# Patient Record
Sex: Female | Born: 1955
Health system: Southern US, Community
[De-identification: ages and names within clinical notes are randomized; demographics above are authoritative.]

## PROBLEM LIST (undated history)

## (undated) DIAGNOSIS — M545 Low back pain: Secondary | ICD-10-CM

## (undated) DIAGNOSIS — J0510 Acute epiglottitis without obstruction: Secondary | ICD-10-CM

## (undated) DIAGNOSIS — M722 Plantar fascial fibromatosis: Secondary | ICD-10-CM

## (undated) DIAGNOSIS — N951 Menopausal and female climacteric states: Secondary | ICD-10-CM

## (undated) DIAGNOSIS — J189 Pneumonia, unspecified organism: Secondary | ICD-10-CM

## (undated) DIAGNOSIS — T4145XA Adverse effect of unspecified anesthetic, initial encounter: Secondary | ICD-10-CM

## (undated) DIAGNOSIS — M79646 Pain in unspecified finger(s): Secondary | ICD-10-CM

## (undated) DIAGNOSIS — Z8619 Personal history of other infectious and parasitic diseases: Secondary | ICD-10-CM

## (undated) DIAGNOSIS — Z9889 Other specified postprocedural states: Secondary | ICD-10-CM

## (undated) DIAGNOSIS — M1990 Unspecified osteoarthritis, unspecified site: Secondary | ICD-10-CM

## (undated) DIAGNOSIS — T8859XA Other complications of anesthesia, initial encounter: Secondary | ICD-10-CM

## (undated) DIAGNOSIS — R112 Nausea with vomiting, unspecified: Secondary | ICD-10-CM

## (undated) HISTORY — DX: Unspecified osteoarthritis, unspecified site: M19.90

## (undated) HISTORY — DX: Low back pain: M54.5

## (undated) HISTORY — DX: Personal history of other infectious and parasitic diseases: Z86.19

## (undated) HISTORY — DX: Pain in unspecified finger(s): M79.646

## (undated) HISTORY — DX: Menopausal and female climacteric states: N95.1

## (undated) HISTORY — DX: Acute epiglottitis without obstruction: J05.10

## (undated) HISTORY — PX: TOTAL ABDOMINAL HYSTERECTOMY W/ BILATERAL SALPINGOOPHORECTOMY: SHX83

## (undated) HISTORY — DX: Plantar fascial fibromatosis: M72.2

## (undated) HISTORY — PX: ABDOMINAL HYSTERECTOMY: SHX81

## (undated) HISTORY — DX: Pneumonia, unspecified organism: J18.9

---
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
## 1974-04-20 HISTORY — PX: REDUCTION MAMMAPLASTY: SUR839

## 1991-04-21 DIAGNOSIS — J189 Pneumonia, unspecified organism: Secondary | ICD-10-CM

## 1991-04-21 HISTORY — DX: Pneumonia, unspecified organism: J18.9

## 1993-04-20 HISTORY — PX: ARTHROSCOPIC REPAIR ACL: SUR80

## 1997-04-20 DIAGNOSIS — M545 Low back pain, unspecified: Secondary | ICD-10-CM

## 1997-04-20 HISTORY — DX: Low back pain, unspecified: M54.50

## 1997-04-20 HISTORY — PX: FOOT SURGERY: SHX648

## 2010-11-21 ENCOUNTER — Encounter: Payer: Self-pay | Admitting: Internal Medicine

## 2010-11-21 ENCOUNTER — Ambulatory Visit (INDEPENDENT_AMBULATORY_CARE_PROVIDER_SITE_OTHER): Payer: PRIVATE HEALTH INSURANCE | Admitting: Internal Medicine

## 2010-11-21 DIAGNOSIS — N951 Menopausal and female climacteric states: Secondary | ICD-10-CM

## 2010-11-21 DIAGNOSIS — M545 Low back pain: Secondary | ICD-10-CM

## 2010-11-21 DIAGNOSIS — Z0000 Encounter for general adult medical examination without abnormal findings: Secondary | ICD-10-CM

## 2010-11-21 DIAGNOSIS — M79609 Pain in unspecified limb: Secondary | ICD-10-CM

## 2010-11-21 DIAGNOSIS — M79646 Pain in unspecified finger(s): Secondary | ICD-10-CM

## 2010-11-21 DIAGNOSIS — M129 Arthropathy, unspecified: Secondary | ICD-10-CM

## 2010-11-21 DIAGNOSIS — M1990 Unspecified osteoarthritis, unspecified site: Secondary | ICD-10-CM
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
## 2010-11-23 ENCOUNTER — Encounter: Payer: Self-pay | Admitting: Internal Medicine

## 2010-11-23 DIAGNOSIS — M79646 Pain in unspecified finger(s): Secondary | ICD-10-CM | POA: Insufficient documentation

## 2010-11-23 DIAGNOSIS — M1990 Unspecified osteoarthritis, unspecified site: Secondary | ICD-10-CM | POA: Insufficient documentation

## 2010-11-23 DIAGNOSIS — E8940 Asymptomatic postprocedural ovarian failure: Secondary | ICD-10-CM | POA: Insufficient documentation

## 2010-11-23 DIAGNOSIS — Z0000 Encounter for general adult medical examination without abnormal findings: Secondary | ICD-10-CM | POA: Insufficient documentation

## 2010-11-23 DIAGNOSIS — M545 Low back pain: Secondary | ICD-10-CM | POA: Insufficient documentation

## 2010-11-23 NOTE — Assessment & Plan Note (Signed)
Moderate discomfort with no limitation in activity.  Plan - referral to [facility] for evaluation, treatment and teaching for a healthy back.

## 2010-11-23 NOTE — Assessment & Plan Note (Signed)
Pain at the PIP joint index finger- worse with pressure. She has seen a hand specialist - no orthopedic problem identified.  Plan - recommended [facility] for possible alternative treatment, i.e. acupuncture

## 2010-11-23 NOTE — Assessment & Plan Note (Signed)
Records have been requested: last 2 years of lab, last 1-2 years of office notes, all imaging studies, endoscopy reports. She did see her gynecologist last year for normal exam. She is informed of ACOG/USPHS recommendation for vulvo-vaginal exam every 3-5 years with no indication for continued PAP smears and that services are available here. She is current with breast health/mammography (records requested) and colorectal cancer screening. Will review records for immunization data.  In summary- a very nice woman who has presented to establish for care. She will obtain records. She is oriented to the practice services and logistics. I have asked that she return in several weeks for a consolidation visit and more thorough physical exam.

## 2010-11-23 NOTE — Assessment & Plan Note (Signed)
Discussed risk and benefit of hormone replacement therapy. She was aware of the risks and feels the benefit is substantial.  Plan - continue present medical regimen

## 2010-12-11 ENCOUNTER — Encounter: Payer: Self-pay | Admitting: Internal Medicine

## 2010-12-12 ENCOUNTER — Encounter: Payer: Self-pay | Admitting: Internal Medicine

## 2010-12-12 ENCOUNTER — Ambulatory Visit (INDEPENDENT_AMBULATORY_CARE_PROVIDER_SITE_OTHER): Payer: PRIVATE HEALTH INSURANCE | Admitting: Internal Medicine

## 2010-12-12 DIAGNOSIS — M545 Low back pain: Secondary | ICD-10-CM

## 2010-12-12 NOTE — Progress Notes (Signed)
  Subjective:    Patient ID: [name], female    DOB: [birth year], 55 y.o.   MRN: [id number]  HPI Mrs. Nagorski presents for follow-up after new patient evaluation. She had a few additions and corrections to history. She does report that she had a flex sig. Dec 11 - normal study.   I have reviewed the patient's medical history in detail and updated the computerized patient record.    Review of Systems System review is negative for any constitutional, cardiac, pulmonary, GI or neuro symptoms or complaints     Objective:   Physical Exam vitsls - stable Gen'l - WNWD white woman in no distress  Cor - RRR Pul - normal respiration.       Assessment & Plan:

## 2010-12-14 NOTE — Assessment & Plan Note (Signed)
Still with discomfort and difficulty playing golf.  Plan - after the dust settles she will be pursuing referral to [facility].

## 2011-01-20 ENCOUNTER — Telehealth: Payer: Self-pay | Admitting: Internal Medicine

## 2011-01-20 NOTE — Telephone Encounter (Signed)
Forwarded to Dr. Norins for review. °

## 2011-01-22 ENCOUNTER — Ambulatory Visit: Payer: PRIVATE HEALTH INSURANCE

## 2011-01-27 ENCOUNTER — Ambulatory Visit (INDEPENDENT_AMBULATORY_CARE_PROVIDER_SITE_OTHER): Payer: PRIVATE HEALTH INSURANCE

## 2011-01-27 DIAGNOSIS — Z23 Encounter for immunization: Secondary | ICD-10-CM

## 2011-04-28 ENCOUNTER — Ambulatory Visit: Payer: Self-pay | Admitting: Internal Medicine

## 2011-08-01 ENCOUNTER — Encounter (HOSPITAL_COMMUNITY): Payer: Self-pay | Admitting: Family Medicine

## 2011-08-01 ENCOUNTER — Emergency Department (HOSPITAL_COMMUNITY)
Admission: EM | Admit: 2011-08-01 | Discharge: 2011-08-01 | Disposition: A | Discharge status: Home / Self Care | Payer: PRIVATE HEALTH INSURANCE | Attending: Emergency Medicine | Admitting: Emergency Medicine

## 2011-08-01 ENCOUNTER — Emergency Department (HOSPITAL_COMMUNITY): Payer: PRIVATE HEALTH INSURANCE

## 2011-08-01 DIAGNOSIS — Z79899 Other long term (current) drug therapy: Secondary | ICD-10-CM | POA: Insufficient documentation

## 2011-08-01 DIAGNOSIS — R609 Edema, unspecified: Secondary | ICD-10-CM | POA: Insufficient documentation

## 2011-08-01 DIAGNOSIS — M129 Arthropathy, unspecified: Secondary | ICD-10-CM | POA: Insufficient documentation

## 2011-08-01 DIAGNOSIS — J189 Pneumonia, unspecified organism: Secondary | ICD-10-CM | POA: Insufficient documentation

## 2011-08-01 DIAGNOSIS — R079 Chest pain, unspecified: Secondary | ICD-10-CM | POA: Insufficient documentation

## 2011-08-01 LAB — URINALYSIS, ROUTINE W REFLEX MICROSCOPIC
Glucose, UA: NEGATIVE mg/dL
Leukocytes, UA: NEGATIVE
Protein, ur: NEGATIVE mg/dL
Specific Gravity, Urine: 1.011 (ref 1.005–1.030)
pH: 6 (ref 5.0–8.0)

## 2011-08-01 LAB — COMPREHENSIVE METABOLIC PANEL
AST: 21 U/L (ref 0–37)
Albumin: 4 g/dL (ref 3.5–5.2)
BUN: 14 mg/dL (ref 6–23)
Chloride: 102 mEq/L (ref 96–112)
Creatinine, Ser: 0.87 mg/dL (ref 0.50–1.10)
Total Bilirubin: 0.5 mg/dL (ref 0.3–1.2)
Total Protein: 7.7 g/dL (ref 6.0–8.3)

## 2011-08-01 LAB — D-DIMER, QUANTITATIVE: D-Dimer, Quant: 0.81 ug/mL-FEU — ABNORMAL HIGH (ref 0.00–0.48)

## 2011-08-01 LAB — APTT: aPTT: 31 seconds (ref 24–37)

## 2011-08-01 LAB — CBC
MCHC: 33.5 g/dL (ref 30.0–36.0)
MCV: 92 fL (ref 78.0–100.0)
Platelets: 253 10*3/uL (ref 150–400)
RDW: 12.7 % (ref 11.5–15.5)
WBC: 5.1 10*3/uL (ref 4.0–10.5)

## 2011-08-01 LAB — POCT I-STAT TROPONIN I: Troponin i, poc: 0 ng/mL (ref 0.00–0.08)

## 2011-08-01 LAB — PROTIME-INR
INR: 0.97 (ref 0.00–1.49)
Prothrombin Time: 13.1 seconds (ref 11.6–15.2)

## 2011-08-01 LAB — URINE MICROSCOPIC-ADD ON

## 2011-08-01 MED ORDER — IOHEXOL 350 MG/ML SOLN
65.0000 mL | Freq: Once | INTRAVENOUS | Status: AC | PRN
  Administered 2011-08-01: 65 mL via INTRAVENOUS

## 2011-08-01 MED ORDER — NITROGLYCERIN 0.4 MG SL SUBL: 0.4000 mg | SUBLINGUAL_TABLET | SUBLINGUAL | Status: DC | PRN

## 2011-08-01 MED ORDER — SODIUM CHLORIDE 0.9 % IV SOLN
1000.0000 mL | INTRAVENOUS | Status: DC
  Administered 2011-08-01: 1000 mL via INTRAVENOUS

## 2011-08-01 MED ORDER — SODIUM CHLORIDE 0.9 % IV SOLN
1000.0000 mL | Freq: Once | INTRAVENOUS | Status: AC
  Administered 2011-08-01: 1000 mL via INTRAVENOUS

## 2011-08-01 MED ORDER — AZITHROMYCIN 250 MG PO TABS
500.0000 mg | ORAL_TABLET | Freq: Once | ORAL | Status: AC
  Administered 2011-08-01: 500 mg via ORAL
  Filled 2011-08-01: qty 2

## 2011-08-01 MED ORDER — ASPIRIN 81 MG PO CHEW
324.0000 mg | CHEWABLE_TABLET | Freq: Once | ORAL | Status: AC
  Administered 2011-08-01: 324 mg via ORAL
  Filled 2011-08-01: qty 4

## 2011-08-01 MED ORDER — AZITHROMYCIN 250 MG PO TABS: 250.0000 mg | ORAL_TABLET | Freq: Every day | ORAL | Status: AC

## 2011-08-01 NOTE — Discharge Instructions (Signed)
You have been diagnosed with Pneumonia. Complete your full coarse of antibiotics as prescribed. Followup with your doctor for a repeat xray to assure your pneumonia has cleared. You may return to the emergency department if symptoms worsen, become progressive, or become more concerning.  Pneumonia, Adult Pneumonia is an infection of the lungs.  CAUSES Pneumonia may be caused by bacteria or a virus. Usually, these infections are caused by breathing infectious particles into the lungs (respiratory tract). SYMPTOMS   Cough.   Fever.   Chest pain.   Increased rate of breathing.   Wheezing.   Mucus production.  DIAGNOSIS  If you have the common symptoms of pneumonia, your caregiver will typically confirm the diagnosis with a chest X-ray. The X-ray will show an abnormality in the lung (pulmonary infiltrate) if you have pneumonia. Other tests of your blood, urine, or sputum may be done to find the specific cause of your pneumonia. Your caregiver may also do tests (blood gases or pulse oximetry) to see how well your lungs are working. TREATMENT  Some forms of pneumonia may be spread to other people when you cough or sneeze. You may be asked to wear a mask before and during your exam. Pneumonia that is caused by bacteria is treated with antibiotic medicine. Pneumonia that is caused by the influenza virus may be treated with an antiviral medicine. Most other viral infections must run their course. These infections will not respond to antibiotics.  PREVENTION A pneumococcal shot (vaccine) is available to prevent a common bacterial cause of pneumonia. This is usually suggested for:  People over 25 years old.   Patients on chemotherapy.   People with chronic lung problems, such as bronchitis or emphysema.   People with immune system problems.  If you are over 65 or have a high risk condition, you may receive the pneumococcal vaccine if you have not received it before. In some countries, a routine  influenza vaccine is also recommended. This vaccine can help prevent some cases of pneumonia.You may be offered the influenza vaccine as part of your care. If you smoke, it is time to quit. You may receive instructions on how to stop smoking. Your caregiver can provide medicines and counseling to help you quit. HOME CARE INSTRUCTIONS   Cough suppressants may be used if you are losing too much rest. However, coughing protects you by clearing your lungs. You should avoid using cough suppressants if you can.   Your caregiver may have prescribed medicine if he or she thinks your pneumonia is caused by a bacteria or influenza. Finish your medicine even if you start to feel better.   Your caregiver may also prescribe an expectorant. This loosens the mucus to be coughed up.   Only take over-the-counter or prescription medicines for pain, discomfort, or fever as directed by your caregiver.   Do not smoke. Smoking is a common cause of bronchitis and can contribute to pneumonia. If you are a smoker and continue to smoke, your cough may last several weeks after your pneumonia has cleared.   A cold steam vaporizer or humidifier in your room or home may help loosen mucus.   Coughing is often worse at night. Sleeping in a semi-upright position in a recliner or using a couple pillows under your head will help with this.   Get rest as you feel it is needed. Your body will usually let you know when you need to rest.  SEEK IMMEDIATE MEDICAL CARE IF:   Your illness becomes  worse. This is especially true if you are elderly or weakened from any other disease.   You cannot control your cough with suppressants and are losing sleep.   You begin coughing up blood.   You develop pain which is getting worse or is uncontrolled with medicines.   You have a fever.   Any of the symptoms which initially brought you in for treatment are getting worse rather than better.   You develop shortness of breath or chest  pain.  MAKE SURE YOU:   Understand these instructions.   Will watch your condition.   Will get help right away if you are not doing well or get worse.

## 2011-08-01 NOTE — ED Notes (Signed)
CT reported pt was tearful and c/o pain with IV site. Joanie Coddington, RN went to CT to assess site, no redness or swelling at site, good blood return then  flushed IV with 10ml NS. No c/o pain from pt.

## 2011-08-01 NOTE — ED Notes (Signed)
Per pt she has been having constant chest pain since last night. sts under left breast. sts hurts worse with deep breathing. Pt slightly anxious. Denies cough, fever, SOB, N,V,D.

## 2011-08-01 NOTE — ED Provider Notes (Signed)
History     CSN: [id number]  Arrival date & time 08/01/11  4098   First MD Initiated Contact with Patient 08/01/11 1004      Chief Complaint  Patient presents with  . Chest Pain    (Consider location/radiation/quality/duration/timing/severity/associated sxs/prior treatment) HPI Comments: Patient currently on replacement therapy presents emergency department with chief complaint of chest pain.  Onset of chest pain began last evening around 8 p.m. is located substernally and in the left breast, described as a constant sharp pain that is pleuritic in nature and rated at a 8/10 in severity, but increases to a 10 out of 10 with deep inhalation.  The patient denies any shortness of breath, diaphoresis, fever, night sweats, chills, syncope, edema, claudication.  Patient is not a smoker and has not recently traveled, had surgery, and does not have a history of cancer.  Patient has not been evaluated by a cardiologist before in the past and is new to the [place] area.  Patient is seen by Dr. Debby Bud, PCP  Patient is a 56 y.o. female presenting with chest pain. The history is provided by the patient.  Chest Pain Pertinent negatives for primary symptoms include no fever, no shortness of breath, no cough, no wheezing, no palpitations, no abdominal pain, no nausea and no dizziness.  Pertinent negatives for associated symptoms include no numbness and no weakness.     Past Medical History  Diagnosis Date  . Arthritis     right  . History of varicella   . Epiglottitis 1959    tracheotomy  . Pneumonia 1993    hospitalized  . Lumbar back pain 1999     initial strain. Intermittent pain since. Re-injured Dec '11. Had PT IN THE PAST.  . Finger pain     pain with pressure on the PIP joint with hemorrhage and swelling. MRI hand inconclusive   . Climacteric     on HRT  . Plantar fasciitis     Past Surgical History  Procedure Date  . Meniscus repair 1975    right medial/ open  . Arthroscopic  repair acl 1995    right-reconstructed with patellar tendon  . Abdominal hysterectomy     endometriosis  . Total abdominal hysterectomy w/ bilateral salpingoophorectomy '02  . Foot surgery 1999    left foot-arch ligament release (metatarsal release ?)  . Reduction mammoplasty 1975    Family History  Problem Relation Age of Onset  . Hypertension Mother   . Heart disease Father     CABG  . Hyperlipidemia Father   . Hypertension Father   . Obesity Father   . Gout Father   . Alcohol abuse Father   . Cancer Neg Hx   . Diabetes Neg Hx   . HIV Brother     History  Substance Use Topics  . Smoking status: Never Smoker   . Smokeless tobacco: Never Used  . Alcohol Use: No    OB History    Grav Para Term Preterm Abortions TAB SAB Ect Mult Living                  Review of Systems  Constitutional: Negative for fever, chills and appetite change.  HENT: Negative for congestion.   Eyes: Negative for visual disturbance.  Respiratory: Negative for cough, chest tightness, shortness of breath and wheezing.   Cardiovascular: Positive for chest pain. Negative for palpitations and leg swelling.  Gastrointestinal: Negative for nausea, abdominal pain and constipation.  Genitourinary: Negative for dysuria,  urgency and frequency.  Neurological: Negative for dizziness, syncope, weakness, light-headedness, numbness and headaches.  Psychiatric/Behavioral: Negative for confusion.  All other systems reviewed and are negative.    Allergies  Penicillins and Tape  Home Medications   Current Outpatient Rx  Name Route Sig Dispense Refill  . VIACTIV PO Oral Take 1 tablet by mouth 2 (two) times daily.    Marland Kitchen EST ESTROGENS-METHYLTEST 0.625-1.25 MG PO TABS Oral Take 1 tablet by mouth daily.      . MULTIPLE VITAMINS PO Oral Take by mouth.      Marland Kitchen VITAMIN C 500 MG PO TABS Oral Take 500 mg by mouth daily.      Marland Kitchen EPINEPHRINE 0.3 MG/0.3ML IJ DEVI Intramuscular Inject 0.3 mg into the muscle as needed.  Allergic reactions...anaphylaxis    . LORAZEPAM 0.5 MG PO TABS Oral Take 0.5 mg by mouth every 8 (eight) hours as needed. anxiety      BP 116/71  Pulse 57  Temp(Src) 97.6 F (36.4 C) (Oral)  Resp 18  SpO2 100%  Physical Exam  Nursing note and vitals reviewed. Constitutional: She appears well-developed and well-nourished. No distress.  HENT:  Head: Normocephalic and atraumatic.  Eyes: Conjunctivae and EOM are normal. Pupils are equal, round, and reactive to light.  Neck: Normal range of motion. Neck supple. Normal carotid pulses and no JVD present. Carotid bruit is not present. No rigidity. Normal range of motion present.  Cardiovascular: Normal rate, regular rhythm, S1 normal, S2 normal, normal heart sounds, intact distal pulses and normal pulses.  Exam reveals no gallop and no friction rub.   No murmur heard.      No pitting edema bilaterally, RRR, no aberrant sounds on auscultations, distal pulses intact, no carotid bruit or JVD.   Pulmonary/Chest: Effort normal and breath sounds normal. No accessory muscle usage or stridor. No respiratory distress. She exhibits no tenderness and no bony tenderness.  Abdominal: Bowel sounds are normal.       Soft non tender. Non pulsatile aorta.   Skin: Skin is warm, dry and intact. No rash noted. She is not diaphoretic. No cyanosis. Nails show no clubbing.    ED Course  Procedures (including critical care time)  Labs Reviewed  COMPREHENSIVE METABOLIC PANEL - Abnormal; Notable for the following:    GFR calc non Af Amer 73 (*)    GFR calc Af Amer 85 (*)    All other components within normal limits  URINALYSIS, ROUTINE W REFLEX MICROSCOPIC - Abnormal; Notable for the following:    Hgb urine dipstick TRACE (*)    All other components within normal limits  D-DIMER, QUANTITATIVE - Abnormal; Notable for the following:    D-Dimer, Quant 0.81 (*)    All other components within normal limits  CBC  PROTIME-INR  APTT  POCT I-STAT TROPONIN I  URINE  MICROSCOPIC-ADD ON   Dg Chest 2 View  08/01/2011  *RADIOLOGY REPORT*  Clinical Data: Left-sided chest pain  CHEST - 2 VIEW  Comparison: Chest CT 08/01/2011  Findings: Normal cardiac silhouette.  Subtle obscuration of the left heart border consistent with pneumonia.  The no pleural fluid. No pneumothorax.  IMPRESSION: Lingular pneumonia.  Original Report Authenticated By: Genevive Bi, M.D.   Ct Angio Chest W/cm &/or Wo Cm  08/01/2011  *RADIOLOGY REPORT*  Clinical Data: Chest pain with pleurisy.  Pain under left breast. Question pulmonary embolism.  CT ANGIOGRAPHY CHEST  Technique:  Multidetector CT imaging of the chest using the standard protocol during bolus  administration of intravenous contrast. Multiplanar reconstructed images including MIPs were obtained and reviewed to evaluate the vascular anatomy.  Contrast: 65mL OMNIPAQUE IOHEXOL 350 MG/ML SOLN  Comparison: None.  Findings: The pulmonary arteries are well opacified with contrast. There is no evidence of acute pulmonary embolism.  The thoracic aorta appears normal for age.  There are no enlarged mediastinal or hilar lymph nodes.  There is air space consolidation with air bronchograms in the lingula.  The pulmonary arteries in this area appear patent.  There is also patchy airspace disease in the right middle lobe.  No mass or endobronchial lesion is demonstrated.  There is no pleural or pericardial effusion.  The visualized upper abdomen appears unremarkable.  IMPRESSION:  1.  No evidence of acute pulmonary embolism. 2.  Lingular and right middle lobe pneumonia.  Radiographic followup is recommended to document clearing.  Original Report Authenticated By: Gerrianne Scale, M.D.     No diagnosis found.   Date: 08/01/2011  Rate: 63   Rhythm: normal sinus rhythm  QRS Axis: normal  Intervals: normal  ST/T Wave abnormalities: normal  Conduction Disutrbances: none  Narrative Interpretation:   Old EKG Reviewed: No old ECG    MDM  Chest  Pain, CAP  Patient is to be discharged with recommendation to follow up with PCP in regards to today's hospital visit. Pt with CAP treated w azithromycin & advised to f-u w pcp to assure PNA has cleared with tx.Chest pain is not likely of cardiac etiology d/t presentation, VSS, no tracheal deviation, no JVD or new murmur, RRR, breath sounds equal bilaterally, EKG without acute abnormalities, negative troponin.  Pt has been advised to return to the ED is CP becomes exertional, associated with diaphoresis or nausea, radiates to left jaw/arm, worsens or becomes concerning in any way. Pt appears reliable for follow up and is agreeable to discharge.   Case has been discussed with and seen by Dr. Alto Denver who agrees with the above plan to discharge.          Jaci Carrel, [place] 08/01/11 1548

## 2011-08-01 NOTE — ED Notes (Signed)
Pt states pain started last night after dinner, states she did try a new food and pain started shortly after eating.

## 2011-08-03 ENCOUNTER — Telehealth: Payer: Self-pay

## 2011-08-03 NOTE — Telephone Encounter (Signed)
[facility] Triage Call Report Triage Record Num: [id number] Operator: Baldomero Lamy Patient Name: Karen [name] Call Date & Time: 08/01/2011 8:41:06AM Patient Phone: [phone] PCP: Illene Regulus Patient Gender: Female PCP Fax : [phone] Patient DOB: 02-Dec-1955 [facility] Name: [facility] Reason for Call: Caller: Daphine/Patient; PCP: Illene Regulus; CB#: [phone]; Call regarding Feels Like She Is Having Gas Or Anxiety in Her Chest. Pt calling regarding extreme anxiety and chest pain, localized under left breast-"feels like when I had a broken rib"-since 4/12 pm. Is having extreme anxiety regarding a situation she did not want to elaborate on. Pt verbalized apprehension repeatedly about speaking with me (triager) because I was a "complete stranger". Triaged per Anxiety: Panic. Disp: See ER Immed. Pt states she does NOT want to go to the ER because she was new to the area and "very scared". Very insistant that Dr. Debby Bud asked pt to call him and not go to ER if pt "needed him". Explained that another MD was on call and she was still insistant the Dr. Debby Bud told her he wanted to be called. Called Md at pt insistance: MD in [employer]: advised that if pt refused ER than to encourage her to be seen at [facility] office for evaluation. Relayed to pt who eventually stated she would go to ER if Dr. Debby Bud thought that best. Care advice given and pt to have husband take her within the next 15 minutes. Pt wished I call ER and let them know she was coming. Spoke with Melody, triage RN and secretary to give them a heads up. Protocol(s) Used: Anxiety: Panic Recommended Outcome per Protocol: See ED Immediately Reason for Outcome: Dizziness, faintness, or lightheadedness lasting more than 15 minutes Care Advice: ~ Another adult should drive. ~ Should not be alone, arrange for support (family member, friend, etc.). Avoid the use of stimulants including caffeine (coffee, some soft drinks,  some energy drinks, tea and chocolate), cocaine, and amphetamines. Also avoid drinking alcohol. ~ ~ IMMEDIATE ACTION Write down provider's name. List or place the following in a bag for transport with the patient: current prescription and/or nonprescription medications; alternative treatments, therapies and medications; and street drugs. ~ 08/01/2011 9:38:20AM Page 1 of 1 [id number]

## 2011-08-04 ENCOUNTER — Encounter: Payer: Self-pay | Admitting: Endocrinology

## 2011-08-04 ENCOUNTER — Ambulatory Visit (INDEPENDENT_AMBULATORY_CARE_PROVIDER_SITE_OTHER): Payer: PRIVATE HEALTH INSURANCE | Admitting: Endocrinology

## 2011-08-04 VITALS — BP 104/62 | HR 62 | Temp 98.0°F | Ht 64.5 in | Wt 119.1 lb

## 2011-08-04 DIAGNOSIS — J189 Pneumonia, unspecified organism: Secondary | ICD-10-CM
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
## 2011-08-04 NOTE — ED Provider Notes (Signed)
Medical screening examination/treatment/procedure(s) were conducted as a shared visit with non-physician practitioner(s) and myself.  I personally evaluated the patient during the encounter  Cyndra Numbers, MD 08/04/11 [phone]

## 2011-08-04 NOTE — Patient Instructions (Addendum)
Please finish antibiotics I hope you feel better soon.  If you don't feel better by next week, please call back. Go to x-ray downstairs in approx 2 weeks to repeat x-ray.  You will receive a letter with results. increase your activity gradually, as you feel like you can do.

## 2011-08-07 ENCOUNTER — Telehealth: Payer: Self-pay

## 2011-08-07 MED ORDER — LEVOFLOXACIN 500 MG PO TABS: 500.0000 mg | ORAL_TABLET | Freq: Every day | ORAL | Status: AC

## 2011-08-07 NOTE — Telephone Encounter (Signed)
Pt called stating that CP associated with Pneumonia has returned but to a lesser degree. Pt completed Azithromycin course on Wednesday and is concerned that she may need another course. Pt denies other sxs like SOB, fever chills or cough.

## 2011-08-07 NOTE — Telephone Encounter (Signed)
Pt advised of Rx/pharmacy 

## 2011-08-07 NOTE — Telephone Encounter (Signed)
i have sent a prescription to your pharmacy 

## 2011-08-17 ENCOUNTER — Ambulatory Visit (INDEPENDENT_AMBULATORY_CARE_PROVIDER_SITE_OTHER): Payer: PRIVATE HEALTH INSURANCE | Admitting: Internal Medicine

## 2011-08-17 ENCOUNTER — Encounter: Payer: Self-pay | Admitting: Internal Medicine

## 2011-08-17 ENCOUNTER — Other Ambulatory Visit (INDEPENDENT_AMBULATORY_CARE_PROVIDER_SITE_OTHER): Payer: PRIVATE HEALTH INSURANCE

## 2011-08-17 ENCOUNTER — Ambulatory Visit (INDEPENDENT_AMBULATORY_CARE_PROVIDER_SITE_OTHER)
Admission: RE | Admit: 2011-08-17 | Discharge: 2011-08-17 | Disposition: A | Discharge status: Home / Self Care | Payer: PRIVATE HEALTH INSURANCE | Source: Ambulatory Visit | Attending: Internal Medicine | Admitting: Internal Medicine

## 2011-08-17 VITALS — BP 98/60 | HR 62 | Temp 97.8°F | Resp 16 | Wt 119.0 lb

## 2011-08-17 DIAGNOSIS — J157 Pneumonia due to Mycoplasma pneumoniae: Secondary | ICD-10-CM

## 2011-08-17 LAB — CBC WITH DIFFERENTIAL/PLATELET
Basophils Absolute: 0 10*3/uL (ref 0.0–0.1)
HCT: 39 % (ref 36.0–46.0)
Hemoglobin: 13.1 g/dL (ref 12.0–15.0)
Lymphs Abs: 1.6 10*3/uL (ref 0.7–4.0)
MCHC: 33.5 g/dL (ref 30.0–36.0)
MCV: 92.5 fl (ref 78.0–100.0)
Monocytes Absolute: 0.5 10*3/uL (ref 0.1–1.0)
Monocytes Relative: 9.4 % (ref 3.0–12.0)
Neutro Abs: 2.8 10*3/uL (ref 1.4–7.7)
Platelets: 181 10*3/uL (ref 150.0–400.0)
RDW: 13.5 % (ref 11.5–14.6)

## 2011-08-17 LAB — COMPREHENSIVE METABOLIC PANEL
ALT: 19 U/L (ref 0–35)
Alkaline Phosphatase: 50 U/L (ref 39–117)
CO2: 31 mEq/L (ref 19–32)
Creatinine, Ser: 0.8 mg/dL (ref 0.4–1.2)
GFR: 75.35 mL/min (ref >60.00)
Sodium: 139 mEq/L (ref 135–145)
Total Bilirubin: 0.5 mg/dL (ref 0.3–1.2)

## 2011-08-17 MED ORDER — AZITHROMYCIN 500 MG PO TABS: 500.0000 mg | ORAL_TABLET | Freq: Every day | ORAL | Status: AC

## 2011-08-17 NOTE — Patient Instructions (Signed)
all the evidence points to mycoplasma pneumoniae - with a respiratory involvement. This is not bacterial pneumonia.  Plan - Chest x-ray today           CBC and metabolic panel todays.           If all is normal - most likely persistent mycoplasma and will retreat with azithromycin.            Hydrate: juice, gator aide, etc. - you are feeling light-headed because of low blood pressure of 98/60 with usual being 120/80
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
## 2011-08-19 ENCOUNTER — Telehealth: Payer: Self-pay | Admitting: *Deleted

## 2011-08-19 NOTE — Telephone Encounter (Signed)
Message copied by Elnora Morrison on Wed Aug 19, 2011  4:54 PM ------      Message from: Illene Regulus E      Created: Mon Aug 17, 2011  6:31 PM       Call to follow up on patient Wedsnday 08/19/11

## 2011-08-19 NOTE — Telephone Encounter (Signed)
Message copied by Ona Roehrs S on Wed Aug 19, 2011  4:53 PM ------      Message from: NORINS, MICHAEL E      Created: Mon Aug 17, 2011  6:31 PM       Call to follow up on patient Wedsnday 08/19/11 

## 2011-08-19 NOTE — Telephone Encounter (Signed)
done

## 2011-08-19 NOTE — Telephone Encounter (Signed)
Message copied by Elnora Morrison on Wed Aug 19, 2011  4:52 PM ------      Message from: Illene Regulus E      Created: Mon Aug 17, 2011  6:31 PM       Call to follow up on patient Wedsnday 08/19/11

## 2011-08-19 NOTE — Telephone Encounter (Signed)
Message copied by Karen [name] on Wed Aug 19, 2011  4:53 PM ------      Message from: Karen [name]      Created: Mon Aug 17, 2011  6:31 PM       Call to follow up on patient Wedsnday 08/19/11

## 2011-08-19 NOTE — Telephone Encounter (Signed)
Message copied by Elnora Morrison on Wed Aug 19, 2011  4:51 PM ------      Message from: Illene Regulus E      Created: Mon Aug 17, 2011  6:31 PM       Call to follow up on patient Wedsnday 08/19/11

## 2011-08-19 NOTE — Telephone Encounter (Signed)
Attempted to call patient to check on. Voice mail box is not set up at phone number in our system  [phone].

## 2011-10-06 ENCOUNTER — Other Ambulatory Visit: Payer: Self-pay | Admitting: *Deleted

## 2011-10-06 MED ORDER — EST ESTROGENS-METHYLTEST 0.625-1.25 MG PO TABS: 1.0000 | ORAL_TABLET | Freq: Every day | ORAL | Status: DC

## 2011-10-06 NOTE — Telephone Encounter (Signed)
Refill sent to [facility]

## 2012-01-20 ENCOUNTER — Encounter: Payer: Self-pay | Admitting: Internal Medicine

## 2012-01-20 ENCOUNTER — Ambulatory Visit (INDEPENDENT_AMBULATORY_CARE_PROVIDER_SITE_OTHER): Payer: PRIVATE HEALTH INSURANCE | Admitting: Internal Medicine

## 2012-01-20 VITALS — BP 100/68 | HR 50 | Temp 97.1°F | Resp 16 | Wt 122.0 lb

## 2012-01-20 DIAGNOSIS — N898 Other specified noninflammatory disorders of vagina: Secondary | ICD-10-CM

## 2012-01-20 DIAGNOSIS — N9489 Other specified conditions associated with female genital organs and menstrual cycle: Secondary | ICD-10-CM

## 2012-01-20 DIAGNOSIS — N390 Urinary tract infection, site not specified: Secondary | ICD-10-CM

## 2012-01-20 LAB — POCT URINALYSIS DIPSTICK
Bilirubin, UA: NEGATIVE
Glucose, UA: NEGATIVE
Ketones, UA: NEGATIVE
Nitrite, UA: NEGATIVE

## 2012-01-20 MED ORDER — FLUCONAZOLE 150 MG PO TABS: 150.0000 mg | ORAL_TABLET | Freq: Once | ORAL | Status: DC

## 2012-01-21 NOTE — Progress Notes (Signed)
  Subjective:    Patient ID: Karen [name], female    DOB: 07/05/1955, 56 y.o.   MRN: [id number]  HPI [name] presents for evaluation of vaginal dryness, irritation, burning in the vulvar region, dysuria -described as external burning discomfort, dyspareunia. She reports she has been symptomatic for some time but worse over the past week or so. She denies any fever, chills, flank pain, urinary frequency or urgency or incontinence, vaginal discharge or odor. She has had mild nausea. She s/p TAH/BSO at age 2 and has been on estraTest for many years.   PMH, FamHx and SocHx reviewed for any changes and relevance.  Current Outpatient Prescriptions on File Prior to Visit  Medication Sig Dispense Refill  . Calcium-Vitamin D-Vitamin K (VIACTIV PO) Take 1 tablet by mouth 2 (two) times daily.      Marland Kitchen EPINEPHrine (EPI-PEN) 0.3 mg/0.3 mL DEVI Inject 0.3 mg into the muscle as needed. Allergic reactions...anaphylaxis      . estrogen-methylTESTOSTERone (ESTRATEST HS) 0.625-1.25 MG per tablet Take 1 tablet by mouth daily.  30 tablet  3  . LORazepam (ATIVAN) 0.5 MG tablet Take 0.5 mg by mouth every 8 (eight) hours as needed. anxiety      . MULTIPLE VITAMINS PO Take by mouth.        . vitamin C (ASCORBIC ACID) 500 MG tablet Take 500 mg by mouth daily.            Review of Systems System review is negative for any constitutional, cardiac, pulmonary, GI or neuro symptoms or complaints other than as described in the HPI.     Objective:   Physical Exam Filed Vitals:   01/20/12 1604  BP: 100/68  Pulse: 50  Temp: 97.1 F (36.2 C)  Resp: 16   WNWD white woman in no distress Cor- RRR Pulm - normal respirations  Dip U/A negative       Assessment & Plan:  Vulvo-vaginal dryness and discomfort. Despite being on estraTest atrophic vaginitis would explain many of her symptoms. Altrernatively, with recent increase in symptoms she may have an atypical vaginitis.  Plan Diflucan Stat 150 x 1  Will perform  pelvic exam if her symptoms are unchanged at her return visit Oct 7th

## 2012-01-25 ENCOUNTER — Ambulatory Visit (INDEPENDENT_AMBULATORY_CARE_PROVIDER_SITE_OTHER): Payer: PRIVATE HEALTH INSURANCE | Admitting: Internal Medicine

## 2012-01-25 ENCOUNTER — Encounter: Payer: Self-pay | Admitting: Internal Medicine

## 2012-01-25 ENCOUNTER — Other Ambulatory Visit (INDEPENDENT_AMBULATORY_CARE_PROVIDER_SITE_OTHER): Payer: PRIVATE HEALTH INSURANCE

## 2012-01-25 VITALS — BP 108/72 | HR 62 | Temp 97.1°F | Resp 16 | Wt 119.0 lb

## 2012-01-25 DIAGNOSIS — J189 Pneumonia, unspecified organism: Secondary | ICD-10-CM

## 2012-01-25 DIAGNOSIS — R1032 Left lower quadrant pain: Secondary | ICD-10-CM

## 2012-01-25 DIAGNOSIS — N951 Menopausal and female climacteric states: Secondary | ICD-10-CM

## 2012-01-25 DIAGNOSIS — M79646 Pain in unspecified finger(s): Secondary | ICD-10-CM

## 2012-01-25 DIAGNOSIS — R252 Cramp and spasm: Secondary | ICD-10-CM

## 2012-01-25 DIAGNOSIS — N898 Other specified noninflammatory disorders of vagina: Secondary | ICD-10-CM

## 2012-01-25 DIAGNOSIS — Z0000 Encounter for general adult medical examination without abnormal findings: Secondary | ICD-10-CM

## 2012-01-25 DIAGNOSIS — Z23 Encounter for immunization: Secondary | ICD-10-CM

## 2012-01-25 DIAGNOSIS — Z1239 Encounter for other screening for malignant neoplasm of breast: Secondary | ICD-10-CM

## 2012-01-25 DIAGNOSIS — Z13220 Encounter for screening for lipoid disorders: Secondary | ICD-10-CM

## 2012-01-25 DIAGNOSIS — Z1231 Encounter for screening mammogram for malignant neoplasm of breast: Secondary | ICD-10-CM

## 2012-01-25 DIAGNOSIS — M79609 Pain in unspecified limb: Secondary | ICD-10-CM

## 2012-01-25 LAB — LDL CHOLESTEROL, DIRECT: Direct LDL: 134 mg/dL

## 2012-01-25 LAB — LIPID PANEL: HDL: 81.4 mg/dL (ref >39.00)
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
## 2012-01-25 NOTE — Patient Instructions (Addendum)
Thanks for coming in. The full note to follow will include lab results and I will address the issues raised today.

## 2012-01-26 ENCOUNTER — Telehealth: Payer: Self-pay | Admitting: Internal Medicine

## 2012-01-26 DIAGNOSIS — R252 Cramp and spasm: Secondary | ICD-10-CM | POA: Insufficient documentation

## 2012-01-26 DIAGNOSIS — R1032 Left lower quadrant pain: Secondary | ICD-10-CM | POA: Insufficient documentation

## 2012-01-26 MED ORDER — METRONIDAZOLE 500 MG PO TABS: 500.0000 mg | ORAL_TABLET | Freq: Two times a day (BID) | ORAL | Status: DC

## 2012-01-26 NOTE — Assessment & Plan Note (Signed)
Long standing h/o nocturnal foot cramps.  Plan   massage using tennis ball or roller  Tonic water (quinine) 8-16 oz per day  Will check Mg level  Ginko Biloba may be helpful  Last resort - gabapentin 100 mg qHS

## 2012-01-26 NOTE — Assessment & Plan Note (Signed)
Interval history as documented in the HPI: no major illness, injury or surgery. Physical exam generally unremarkable. Lab results in normal range: LDL just above goal of 130 or less and HDL is robust giving a very protective LDL/HDL 1.6. She is current with colorectal cancer screening and is s/p hysterectomy. Due for Tdap and will return for pneumonia vaccine.  In summary - a very nice woman with issues as outlined above but generally in good health. She will contact the office should she wish to pursue consultations as described. Otherwise, she will return as needed.

## 2012-01-26 NOTE — Assessment & Plan Note (Signed)
Reports an injury approximately 1 year ago, '12,  of a left groin pull after a golf outing. This persisted for a long while. She has re injured herself with minor twisting movements twice and has continued discomfort. No h/o hip pathology.  Plan Proposed consultation with sports medicine - Dr. Farris Has  If indicated recommend PT: either with Denice Paradise, PT or [facility]

## 2012-01-26 NOTE — Assessment & Plan Note (Signed)
By report Ms. Doffing has a long h/o dyspareunia to the extend of real pain and post-coital bleeding. She is s/p TAH/BSO and has been on HRT w/ testosterone for a long time. She is aware of the risks of long term HRT and prefers to continue treatment. She was seen 1 week ago for acute dysuria-external that did clear with fluconazole but she continues to have vaginal discomfort: a dryness/ chafing discomfort that has become constant. On exam today she had a cream colored discharge suggestive of Bacterial vaginosis - specimen sent to lab.  Plan If indicated will treat for BV with flagyl  After treatment if symptoms persist, even though on oral HRT, will prescribe a trial of topical hormone therapy.

## 2012-01-26 NOTE — Telephone Encounter (Signed)
Left message: specimen could not be analyzed.  Plan - flagyl 500 mg bid x 7-Rx eScribed  Report back with results: improved symptoms or not  After treatment for presumed BV will consider topical hormone treatment

## 2012-01-26 NOTE — Assessment & Plan Note (Signed)
Continued pain in the index fingers that does not allow her to grip a golf club. She has had ortho consult when she was in [place]. Would like to resume playing golf. Discussed possibility of repeat ortho consult at the [facility].   Plan- She will consider this option and report back should she wish a referral.

## 2012-01-26 NOTE — Assessment & Plan Note (Signed)
Resolved. Normal pulmonary exam today. Reviewed last CXR from April - no area of scarring or infarct. With normal exam will not repeat CXR at today's exam.

## 2012-01-31 ENCOUNTER — Encounter: Payer: Self-pay | Admitting: Internal Medicine

## 2012-02-22 ENCOUNTER — Other Ambulatory Visit: Payer: Self-pay | Admitting: Internal Medicine

## 2012-02-22 ENCOUNTER — Ambulatory Visit: Admission: RE | Admit: 2012-02-22 | Payer: PRIVATE HEALTH INSURANCE | Source: Ambulatory Visit

## 2012-02-22 ENCOUNTER — Ambulatory Visit
Admission: RE | Admit: 2012-02-22 | Discharge: 2012-02-22 | Disposition: A | Discharge status: Home / Self Care | Payer: PRIVATE HEALTH INSURANCE | Source: Ambulatory Visit | Attending: Internal Medicine | Admitting: Internal Medicine

## 2012-02-22 DIAGNOSIS — Z1231 Encounter for screening mammogram for malignant neoplasm of breast: Secondary | ICD-10-CM

## 2012-03-28 ENCOUNTER — Ambulatory Visit: Payer: PRIVATE HEALTH INSURANCE

## 2012-04-14 ENCOUNTER — Other Ambulatory Visit: Payer: Self-pay | Admitting: *Deleted

## 2012-04-14 MED ORDER — EST ESTROGENS-METHYLTEST 0.625-1.25 MG PO TABS: 1.0000 | ORAL_TABLET | Freq: Every day | ORAL | Status: DC

## 2012-04-15 ENCOUNTER — Other Ambulatory Visit: Payer: Self-pay | Admitting: Internal Medicine

## 2012-04-15 MED ORDER — EST ESTROGENS-METHYLTEST 0.625-1.25 MG PO TABS: 1.0000 | ORAL_TABLET | Freq: Every day | ORAL | Status: DC

## 2012-04-29 ENCOUNTER — Telehealth: Payer: Self-pay | Admitting: Internal Medicine

## 2012-04-29 NOTE — Telephone Encounter (Signed)
Patient Information:  Caller Name: Raechal  Phone: [phone]  Patient: Karen [name], Karen [name]  Gender: Female  DOB: 1956-03-10  Age: 57 Years  PCP: Illene Regulus (Adults only)  Office Follow Up:  Does the office need to follow up with this patient?: No  Instructions For The Office: N/A  RN Note:  pt denies any difficulty breathing.  Pt is also worried if she should get the pneumonia shot while she still has this pain.  Symptoms  Reason For Call & Symptoms: pt reports she was diagnosed with pneumonia in April 2013.  Pt states that she is still having the pain (same spot as her pneumonia was).  Pt states pain is on left side and she feels it worse when she is laying on her right side.  Pt states it is constant but it is dull.  Reviewed Health History In EMR: Yes  Reviewed Medications In EMR: Yes  Reviewed Allergies In EMR: Yes  Reviewed Surgeries / Procedures: Yes  Date of Onset of Symptoms: 08/01/2011  Guideline(s) Used:  No Protocol Available - Sick Adult  Disposition Per Guideline:   See Within 3 Days in Office  Reason For Disposition Reached:   Nursing judgment  Advice Given:  N/A  Appointment Scheduled:  05/02/2012 11:15:00 Appointment Scheduled Provider:  Illene Regulus (Adults only)

## 2012-05-02 ENCOUNTER — Encounter: Payer: Self-pay | Admitting: Internal Medicine

## 2012-05-02 ENCOUNTER — Ambulatory Visit (INDEPENDENT_AMBULATORY_CARE_PROVIDER_SITE_OTHER): Payer: PRIVATE HEALTH INSURANCE | Admitting: Internal Medicine

## 2012-05-02 VITALS — BP 102/60 | HR 61 | Temp 97.2°F | Resp 10 | Wt 122.1 lb

## 2012-05-02 DIAGNOSIS — Z23 Encounter for immunization: Secondary | ICD-10-CM

## 2012-05-02 DIAGNOSIS — R1012 Left upper quadrant pain: Secondary | ICD-10-CM

## 2012-05-02 NOTE — Progress Notes (Signed)
  Subjective:    Patient ID: Karen [name], female    DOB: [birth year], 57 y.o.   MRN: [id number]  HPI Karen [name] had pneumonia last April '13 with pleuritic chest pain lower left chest. She has continued to have pain in this area that is enough to waken her from sleep. Not exacerbated by deep inspiration. Noticeable when she sleeps on her left side. No shortness of breath, no cough, no fever. The pain is a constant, nagging discomfort with occasional flare. Reviewed X-ray and CT images and reports with the patient. Pneumonia was read out as being left lingula and RLL but feint. Bony structures were normal and there was no other abnormality.   PMH, FamHx and SocHx reviewed for any changes and relevance. Current Outpatient Prescriptions on File Prior to Visit  Medication Sig Dispense Refill  . Calcium-Vitamin D-Vitamin K (VIACTIV PO) Take 1 tablet by mouth 2 (two) times daily.      Marland Kitchen EPINEPHrine (EPI-PEN) 0.3 mg/0.3 mL DEVI Inject 0.3 mg into the muscle as needed. Allergic reactions...anaphylaxis      . estrogen-methylTESTOSTERone 0.625-1.25 MG per tablet Take 1 tablet by mouth daily.  30 tablet  5  . LORazepam (ATIVAN) 0.5 MG tablet Take 0.5 mg by mouth every 8 (eight) hours as needed. anxiety      . MULTIPLE VITAMINS PO Take by mouth.        . vitamin C (ASCORBIC ACID) 500 MG tablet Take 500 mg by mouth daily.            Review of Systems System review is negative for any constitutional, cardiac, pulmonary, GI or neuro symptoms or complaints other than as described in the HPI.     Objective:   Physical Exam Filed Vitals:   05/02/12 1124  BP: 102/60  Pulse: 61  Temp: 97.2 F (36.2 C)  Resp: 10   Gen'l- WNWD slender white woman in no distress Cor- RRR Pulm - normal breath sounds, no pleuritic rub, no pain with deep inspirations Chest wall - mild tenderness to palpation along left chest wall at anterior axillary line Abd - no palpable spleen. Tender to percussion over the lower left  chest. Very tender with deep palpation LUQ       Assessment & Plan:  Abdominal pain LUQ - Patient has persistent pain in this region. Exam is negative for splenomegaly. Her symptoms are mild. She does report that on ENT evaluation for recurrent laryngitis, which was negative, reflux was raised as a possibility. She has had no illness to suggest immune compromise. Suspect low grade dyspepsia vs spleen related discomfort. No evidence to suggest any pulmonary process.  Plan Trial of acid reduction, e.g. Full dose Ranitidine BID  For unrelieved and persistent discomfort will move ahead with CT abdomen w/ contrast to evaluate spleen.

## 2012-05-02 NOTE — Patient Instructions (Addendum)
Left sided pain in the area of the lower chest. We reviewed the x-rays and CT angio - nothing that will cause long term pain is noted. There is tenderness in the left upper quadrant raising the question of chronic dyspepsia, acid related irritation, vs any issue involving the spleen which is also in that area. On exam there was no palpable spleen - thus no enlargement  Plan Trial of acid blocking medication, e.g. Rantidine 150 mg twice a day or the equivalent product.  If the acid blocking agent doesn't relieve the discomfort we will move ahead with a CT scan of the abdomen with IV contrast to evaluate the spleen.

## 2012-05-31 ENCOUNTER — Encounter: Payer: Self-pay | Admitting: Internal Medicine

## 2012-09-26 ENCOUNTER — Other Ambulatory Visit: Payer: Self-pay

## 2012-09-26 MED ORDER — EST ESTROGENS-METHYLTEST 0.625-1.25 MG PO TABS: 1.0000 | ORAL_TABLET | Freq: Every day | ORAL | Status: DC

## 2012-12-27 ENCOUNTER — Telehealth: Payer: Self-pay | Admitting: *Deleted

## 2012-12-27 NOTE — Telephone Encounter (Signed)
[facility] Triage Call Report Triage Record Num: [id number] Operator: Sula Rumple Patient Name: Karen [name] Call Date & Time: 12/26/2012 5:03:00PM Patient Phone: [phone] PCP: Illene Regulus Patient Gender: Female PCP Fax : [phone] Patient DOB: 03-11-1956 Practice Name: [facility] Reason for Call: Caller: Chandy/Patient; PCP: Illene Regulus (Adults only); CB#: [phone]; Call regarding Stiff Neck; Pt calling on 12/26/12 states she woke 12/24/12 with severe neck pain/had a similar problem a few weeks ago. Pt is having difficutly moving her neck/muscles in the rest of her body were sore also../pt has been using Advil prn without relief/Home care advice given/See Provider within 72 hours per Neck pain and decreased range of motion in neck and has not respoidned to 48 hours of home care/ Appt scheduled for 12/28/12 E 11:15 due to scheduling conflict of patient. Protocol(s) Used: Neck Pain Recommended Outcome per Protocol: See Provider within 72 Hours Reason for Outcome: Neck pain and decreased range of motion in neck, shoulder, or arm AND has not responded to 48 hours of home care Care Advice: Maintain good posture. Avoid putting pressure on a nerve by not carrying heavy objects such as computer case or backpack. Avoid overuse activities - alternate activities by switching sides and limit length of activity. Avoid lifting heavy objects. ~ Analgesic/Antipyretic Advice - Acetaminophen: Consider acetaminophen as directed on label or by pharmacist/provider for pain or fever PRECAUTIONS: - Use if there is no history of liver disease, alcoholism, or intake of three or more alcohol drinks per day - Only if approved by provider during pregnancy or when breastfeeding - During pregnancy, acetaminophen should not be taken more than 3 consecutive days without telling provider - Do not exceed recommended dose or frequency ~ Apply local moist heat (such as a warm, wet wash cloth  covered with plastic wrap) to the area for 15-20 minutes every 2-3 hours while awake. ~ 09/

## 2012-12-28 ENCOUNTER — Ambulatory Visit (INDEPENDENT_AMBULATORY_CARE_PROVIDER_SITE_OTHER)
Admission: RE | Admit: 2012-12-28 | Discharge: 2012-12-28 | Disposition: A | Discharge status: Home / Self Care | Payer: PRIVATE HEALTH INSURANCE | Source: Ambulatory Visit | Attending: Internal Medicine | Admitting: Internal Medicine

## 2012-12-28 ENCOUNTER — Ambulatory Visit (INDEPENDENT_AMBULATORY_CARE_PROVIDER_SITE_OTHER): Payer: PRIVATE HEALTH INSURANCE | Admitting: Internal Medicine

## 2012-12-28 ENCOUNTER — Encounter: Payer: Self-pay | Admitting: Internal Medicine

## 2012-12-28 VITALS — BP 116/70 | HR 61 | Temp 98.0°F | Wt 122.0 lb

## 2012-12-28 DIAGNOSIS — Z23 Encounter for immunization: Secondary | ICD-10-CM

## 2012-12-28 DIAGNOSIS — M436 Torticollis: Secondary | ICD-10-CM

## 2012-12-28 MED ORDER — DIAZEPAM 2 MG PO TABS: 2.0000 mg | ORAL_TABLET | ORAL | Status: DC | PRN
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
## 2012-12-28 NOTE — Patient Instructions (Addendum)
Torticollis - a very stiff neck. It is most likely a severe set of muscle spasms but we will make sure that there is no underlying structural problems by check x-rays of the cervical spine. Plan Soft- cerivcal collar for short term use  Diazepam 2 mg: 1/2 or 1 tablet every 4 hours as needed for severe muscle spasm  Continue with PT and stretches they teach  Neck supporting pillow.   Torticollis, Acute You have suddenly (acutely) developed a twisted neck (torticollis). This is usually a self-limited condition. CAUSES  Acute torticollis may be caused by malposition, trauma or infection. Most commonly, acute torticollis is caused by sleeping in an awkward position. Torticollis may also be caused by the flexion, extension or twisting of the neck muscles beyond their normal position. Sometimes, the exact cause may not be known. SYMPTOMS  Usually, there is pain and limited movement of the neck. Your neck may twist to one side. DIAGNOSIS  The diagnosis is often made by physical examination. X-rays, CT scans or MRIs may be done if there is a history of trauma or concern of infection. TREATMENT  For a common, stiff neck that develops during sleep, treatment is focused on relaxing the contracted neck muscle. Medications (including shots) may be used to treat the problem. Most cases resolve in several days. Torticollis usually responds to conservative physical therapy. If left untreated, the shortened and spastic neck muscle can cause deformities in the face and neck. Rarely, surgery is required. HOME CARE INSTRUCTIONS   Use over-the-counter and prescription medications as directed by your caregiver.  Do stretching exercises and massage the neck as directed by your caregiver.  Follow up with physical therapy if needed and as directed by your caregiver. SEEK IMMEDIATE MEDICAL CARE IF:   You develop difficulty breathing or noisy breathing (stridor).  You drool, develop trouble swallowing or have pain  with swallowing.  You develop numbness or weakness in the hands or feet.  You have changes in speech or vision.  You have problems with urination or bowel movements.  You have difficulty walking.  You have a fever.  You have increased pain. MAKE SURE YOU:   Understand these instructions.  Will watch your condition.  Will get help right away if you are not doing well or get worse. Document Released: 04/03/2000 Document Revised: 06/29/2011 Document Reviewed: 05/15/2009 [facility] Patient Information 2014 [place], [place].

## 2012-12-29 ENCOUNTER — Encounter: Payer: Self-pay | Admitting: Internal Medicine

## 2012-12-30 DIAGNOSIS — M436 Torticollis: Secondary | ICD-10-CM | POA: Insufficient documentation

## 2012-12-30 NOTE — Assessment & Plan Note (Signed)
Severe neck pain and spasm with loss of range of motion. Neurologically intact. Imaging without fracture, disk height loss but with loss of normal lordosis.  Plan Soft cervical collar  Valium 1-2 mg q 6 for muscle spasm  NSAID of choice  PT - continue with Integrative therapies.

## 2013-01-02 ENCOUNTER — Encounter: Payer: Self-pay | Admitting: Internal Medicine

## 2013-01-30 ENCOUNTER — Encounter: Payer: Self-pay | Admitting: Internal Medicine

## 2013-01-30 ENCOUNTER — Ambulatory Visit (INDEPENDENT_AMBULATORY_CARE_PROVIDER_SITE_OTHER): Payer: PRIVATE HEALTH INSURANCE | Admitting: Internal Medicine

## 2013-01-30 VITALS — BP 112/64 | HR 55 | Temp 96.8°F | Ht 64.5 in | Wt 124.0 lb

## 2013-01-30 DIAGNOSIS — R1032 Left lower quadrant pain: Secondary | ICD-10-CM

## 2013-01-30 DIAGNOSIS — Z0000 Encounter for general adult medical examination without abnormal findings: Secondary | ICD-10-CM

## 2013-01-30 DIAGNOSIS — M436 Torticollis: Secondary | ICD-10-CM

## 2013-01-30 DIAGNOSIS — Z23 Encounter for immunization: Secondary | ICD-10-CM

## 2013-01-30 NOTE — Assessment & Plan Note (Signed)
Doing well - has been able to swing a golf club.

## 2013-01-30 NOTE — Assessment & Plan Note (Signed)
Reviewed x-ray image and report with patient - loss of normal lordotic curve otherwise OK. Gave a report.  Plan Continue with [facility] therapy at [facility]  OK to use valium 1-2 mg prn for as long as helpful. Discussed habituating nature but at low dose low riski.   Continue home exercise program.

## 2013-01-30 NOTE — Patient Instructions (Signed)
Thanks for coming in.  Your exam was normal, including breast exam. Skin lesion right anterior shoulder is normal.  You will get a Tdap today - good for 10 years.  You will need a colonoscopy at 60-61.  No lab needed - you are current.  There is no imminent Ebola risk at this time.
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
## 2013-01-30 NOTE — Assessment & Plan Note (Signed)
Interval h/x notable for persistent torticollis, but improving. Physical exam, sans pelvic, normal. She is current with colonoscopy by patient report, normal breast exam and mammography, current with Gyn - Dr. Henderson Cloud. Reviewed previous labs- no problems identified. Immunization - given Tdap.  She is current with dermatology. Minor benign lesion on right upper chest and left collarbone examined.   In summary -  A very nice woman who appears medically stable at this time. She will continue to work on torticollis wiwth PT at [facility] and well as doing home exercises.

## 2013-03-31 ENCOUNTER — Other Ambulatory Visit: Payer: Self-pay

## 2013-03-31 MED ORDER — EST ESTROGENS-METHYLTEST 0.625-1.25 MG PO TABS: 1.0000 | ORAL_TABLET | Freq: Every day | ORAL | Status: DC

## 2013-03-31 NOTE — Telephone Encounter (Signed)
Script has been faxed to [phone]

## 2013-04-10 ENCOUNTER — Other Ambulatory Visit: Payer: Self-pay

## 2013-04-10 DIAGNOSIS — Z9889 Other specified postprocedural states: Secondary | ICD-10-CM

## 2013-04-10 DIAGNOSIS — Z1231 Encounter for screening mammogram for malignant neoplasm of breast: Secondary | ICD-10-CM

## 2013-04-24 ENCOUNTER — Encounter: Payer: Self-pay | Admitting: Internal Medicine

## 2013-04-25 MED ORDER — DIAZEPAM 2 MG PO TABS: 2.0000 mg | ORAL_TABLET | ORAL | Status: DC | PRN

## 2013-05-16 ENCOUNTER — Ambulatory Visit
Admission: RE | Admit: 2013-05-16 | Discharge: 2013-05-16 | Disposition: A | Discharge status: Home / Self Care | Payer: PRIVATE HEALTH INSURANCE | Source: Ambulatory Visit

## 2013-05-16 DIAGNOSIS — Z1231 Encounter for screening mammogram for malignant neoplasm of breast: Secondary | ICD-10-CM

## 2013-05-16 DIAGNOSIS — Z9889 Other specified postprocedural states: Secondary | ICD-10-CM

## 2013-06-26 ENCOUNTER — Telehealth: Payer: Self-pay | Admitting: *Deleted

## 2013-06-26 NOTE — Telephone Encounter (Signed)
Patient phoned & stated that she was changing to Karen [name] as her PCP after Norins' retirement.  [facility] has requested that [facility] extend prescriptions for her estrogen & diazepam through October, when she is scheduled for initial OV with M[facility].  Please advise.  CB# [phone]

## 2013-06-26 NOTE — Telephone Encounter (Signed)
okey dokey. Is Dr. Lenord FellersBaxley on EPIC?

## 2013-06-27 MED ORDER — DIAZEPAM 2 MG PO TABS: 2.0000 mg | ORAL_TABLET | ORAL | Status: DC | PRN

## 2013-06-27 MED ORDER — EST ESTROGENS-METHYLTEST 0.625-1.25 MG PO TABS: 1.0000 | ORAL_TABLET | Freq: Every day | ORAL | Status: DC

## 2013-06-27 NOTE — Telephone Encounter (Signed)
Scripts printed & awaiting MD signature 

## 2013-06-27 NOTE — Telephone Encounter (Signed)
Phoned & notified patient scripts available for p/u (since pharmacies cannot hold onto hard copies).

## 2013-06-27 NOTE — Telephone Encounter (Signed)
Yes sir.  

## 2013-12-12 ENCOUNTER — Telehealth: Payer: Self-pay | Admitting: Internal Medicine

## 2013-12-12 ENCOUNTER — Encounter: Payer: Self-pay | Admitting: Internal Medicine

## 2013-12-12 ENCOUNTER — Ambulatory Visit (INDEPENDENT_AMBULATORY_CARE_PROVIDER_SITE_OTHER): Payer: PRIVATE HEALTH INSURANCE | Admitting: Internal Medicine

## 2013-12-12 VITALS — BP 104/66 | HR 60 | Temp 98.3°F | Ht 64.5 in | Wt 123.5 lb

## 2013-12-12 DIAGNOSIS — R209 Unspecified disturbances of skin sensation: Secondary | ICD-10-CM

## 2013-12-12 DIAGNOSIS — R200 Anesthesia of skin: Secondary | ICD-10-CM

## 2013-12-12 DIAGNOSIS — R202 Paresthesia of skin: Principal | ICD-10-CM

## 2013-12-12 NOTE — Telephone Encounter (Signed)
She noticed the tingling in R arm started during the final flight home from the [employer] (about 2 hours into the flight).  No SOB, no other complaints.  She has been home x 24 hours.  Last night she woke up with the tingling in the R arm again and she was nauseated at that time.  She's still nauseous but not unbearable.  She thought maybe she was dehydrated, so she has been pushing the fluids and also drinking some gatorade.    Are you ok with seeing her for this issue?  She is quite a historian of information.  She is very concerned and worried about the lump in her hand.  It has been there for a couple of months now.  It's not hurting her, but it is visibly seen and feels as though it is "pulling" inside the palm of her hand.  She did not hit it or injure it in any way.

## 2013-12-12 NOTE — Telephone Encounter (Signed)
Spoke with patient and gave appointment for today at 4pm.   Patient very appreciative.  Chart made and advised patient to bring a copy of her ID and her insurance card as well as a list of medications since we haven't seen her before.  Patient verbalized understanding of these instructions.

## 2013-12-12 NOTE — Telephone Encounter (Signed)
See today at 4pm

## 2013-12-14 ENCOUNTER — Other Ambulatory Visit: Payer: PRIVATE HEALTH INSURANCE | Admitting: Internal Medicine

## 2013-12-14 DIAGNOSIS — Z13228 Encounter for screening for other metabolic disorders: Secondary | ICD-10-CM

## 2013-12-14 DIAGNOSIS — Z130 Encounter for screening for diseases of the blood and blood-forming organs and certain disorders involving the immune mechanism: Secondary | ICD-10-CM

## 2013-12-14 DIAGNOSIS — Z0000 Encounter for general adult medical examination without abnormal findings: Secondary | ICD-10-CM

## 2013-12-14 DIAGNOSIS — Z1329 Encounter for screening for other suspected endocrine disorder: Secondary | ICD-10-CM

## 2013-12-14 DIAGNOSIS — Z13220 Encounter for screening for lipoid disorders: Secondary | ICD-10-CM

## 2013-12-14 LAB — CBC WITH DIFFERENTIAL/PLATELET
BASOS PCT: 1 % (ref 0–1)
Basophils Absolute: 0 10*3/uL (ref 0.0–0.1)
EOS ABS: 0.1 10*3/uL (ref 0.0–0.7)
EOS PCT: 2 % (ref 0–5)
HEMATOCRIT: 36.5 % (ref 36.0–46.0)
HEMOGLOBIN: 12.5 g/dL (ref 12.0–15.0)
Lymphocytes Relative: 38 % (ref 12–46)
Lymphs Abs: 1.9 10*3/uL (ref 0.7–4.0)
MCH: 30.6 pg (ref 26.0–34.0)
MCHC: 34.2 g/dL (ref 30.0–36.0)
MCV: 89.5 fL (ref 78.0–100.0)
MONO ABS: 0.4 10*3/uL (ref 0.1–1.0)
MONOS PCT: 9 % (ref 3–12)
Neutro Abs: 2.5 10*3/uL (ref 1.7–7.7)
Neutrophils Relative %: 50 % (ref 43–77)
Platelets: 223 10*3/uL (ref 150–400)
RBC: 4.08 MIL/uL (ref 3.87–5.11)
RDW: 13.8 % (ref 11.5–15.5)
WBC: 4.9 10*3/uL (ref 4.0–10.5)

## 2013-12-15 ENCOUNTER — Telehealth: Payer: Self-pay

## 2013-12-15 LAB — COMPREHENSIVE METABOLIC PANEL
ALK PHOS: 41 U/L (ref 39–117)
ALT: 13 U/L (ref 0–35)
AST: 18 U/L (ref 0–37)
Albumin: 4 g/dL (ref 3.5–5.2)
BILIRUBIN TOTAL: 0.4 mg/dL (ref 0.2–1.2)
BUN: 17 mg/dL (ref 6–23)
CO2: 28 mEq/L (ref 19–32)
CREATININE: 0.98 mg/dL (ref 0.50–1.10)
Calcium: 9 mg/dL (ref 8.4–10.5)
Chloride: 104 mEq/L (ref 96–112)
GLUCOSE: 91 mg/dL (ref 70–99)
Potassium: 4.1 mEq/L (ref 3.5–5.3)
Sodium: 139 mEq/L (ref 135–145)
Total Protein: 6.2 g/dL (ref 6.0–8.3)

## 2013-12-15 LAB — LIPID PANEL
Cholesterol: 197 mg/dL (ref 0–200)
HDL: 72 mg/dL (ref >39)
LDL CALC: 119 mg/dL — AB (ref 0–99)
TRIGLYCERIDES: 31 mg/dL (ref <150)
Total CHOL/HDL Ratio: 2.7 Ratio
VLDL: 6 mg/dL (ref 0–40)

## 2013-12-15 LAB — VITAMIN D 25 HYDROXY (VIT D DEFICIENCY, FRACTURES): Vit D, 25-Hydroxy: 59 ng/mL (ref 30–89)

## 2013-12-15 LAB — TSH: TSH: 1.879 u[IU]/mL (ref 0.350–4.500)

## 2013-12-15 NOTE — Telephone Encounter (Signed)
Patient's insurance not accepted at the [facility]. She states the tingling is greatly decreased. Per Dr. Lenord Fellers, refer to neurologist. Meanwhile she is concerned with bump or cyst on her palm. Given the name of Mack Hook MD, hand specialist at [facility] ortho. She will call to make her own appointment.

## 2014-01-05 ENCOUNTER — Telehealth: Payer: Self-pay | Admitting: Internal Medicine

## 2014-01-05 NOTE — Telephone Encounter (Signed)
Pt called back and stated [facility] will give her another month due to her doctor retired.

## 2014-01-05 NOTE — Telephone Encounter (Signed)
Pt called stated that Dr. Debby Bud gave her written Rx for estrogen-methytestoster for a year so she will have it until she see another doctor. Pt is schedule to see Dr. Lenord Fellers on 02/02/14. She went to [facility] to get this med refill but they told her that they can not take this written Rx due to the fact that Dr.Norins wrote it in march 2015. Pt was wondering if we can help with medication until she see Dr. Lenord Fellers in 02/02/14. Please call pt asap, pt only couple pill left.

## 2014-01-16 NOTE — Patient Instructions (Signed)
Offered to get patient MRI of the brain in the near future but she's going out of town. May take Valium 2 mg at bedtime. She's to call me if symptoms worsen immediately

## 2014-01-16 NOTE — Progress Notes (Signed)
   Subjective:    Patient ID: Karen [name], female    DOB: [birth year], 58 y.o.   MRN: [id number]  HPI  This is  first visit for this 58 year old female previously seen by Dr. Ranell PatrickNorris who recently retired. She is here today to discuss some odd symptoms she has had lately. She has felt some numbness in her hand and arm. Admits to being underload of stress recently.    Past medical history pneumonia 1993, pneumonia 2003, shingles 1971  History of torn meniscus right knee 1974 requiring surgery , torn anterior cruciate ligament right knee 1995 requiring surgery  Tracheostomy 1959, bilateral breast reduction 1976, total hysterectomy 2002  She is allergic to penicillin. She's allergic to adhesive tape.  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 One daughter age 59 who is an actress. Nonsmoker. No alcohol consumption. Husband is employed by [employer].  [facility]. Hasn't been able to play golf very much. Also has muscle spasms in the neck that responded to bagging.  Family history: Father with history of MI and hypertension H. 3086. Mother in good health at age 59. 3 brothers. One brother died of AIDS at age 59. 2 brothers ages 3061 and 1751 in good health.    Review of Systems     Objective:   Physical Exam Patient has felt some numbness around her face and in her left hand. This was associated with stressful trip across the country. She was concerned about possible stroke or TIA.  Skin is warm and dry. Nodes none. PERRLA.  Extraocular movements are full. Deep tendon reflexes 2+ and symmetrical in the upper and lower extremities. Muscle strength is normal the upper and lower extremities. No facial weakness. Uvula midline. Sensation intact. Cerebellar testing is normal.       Assessment & Plan:  Unexplained numbness of face and hand. Consider carpal tunnel syndrome. Numbness of face is not been consistent.  I think stress may be playing a role. She may take some Valium at bedtime if needed. We can get MRI of the brain in the near future. She'll call me and let he know how things are going. Certainly let me know if symptoms worsen.

## 2014-01-25 ENCOUNTER — Ambulatory Visit: Payer: PRIVATE HEALTH INSURANCE | Admitting: Neurology

## 2014-02-01 ENCOUNTER — Other Ambulatory Visit: Payer: PRIVATE HEALTH INSURANCE | Admitting: Internal Medicine

## 2014-02-02 ENCOUNTER — Telehealth: Payer: Self-pay

## 2014-02-02 ENCOUNTER — Ambulatory Visit (INDEPENDENT_AMBULATORY_CARE_PROVIDER_SITE_OTHER): Payer: PRIVATE HEALTH INSURANCE | Admitting: Internal Medicine

## 2014-02-02 ENCOUNTER — Encounter: Payer: Self-pay | Admitting: Internal Medicine

## 2014-02-02 VITALS — BP 108/68 | HR 53 | Temp 97.4°F | Resp 14 | Ht 64.0 in | Wt 123.0 lb

## 2014-02-02 DIAGNOSIS — N6320 Unspecified lump in the left breast, unspecified quadrant: Secondary | ICD-10-CM

## 2014-02-02 DIAGNOSIS — E7800 Pure hypercholesterolemia, unspecified: Secondary | ICD-10-CM

## 2014-02-02 DIAGNOSIS — N63 Unspecified lump in breast: Secondary | ICD-10-CM

## 2014-02-02 DIAGNOSIS — Z0000 Encounter for general adult medical examination without abnormal findings: Secondary | ICD-10-CM

## 2014-02-02 LAB — POCT URINALYSIS DIPSTICK
BILIRUBIN UA: NEGATIVE
Blood, UA: NEGATIVE
Glucose, UA: NEGATIVE
KETONES UA: NEGATIVE
Leukocytes, UA: NEGATIVE
Nitrite, UA: NEGATIVE
PH UA: 6
Protein, UA: NEGATIVE
Spec Grav, UA: 1.015
Urobilinogen, UA: NEGATIVE

## 2014-02-02 MED ORDER — EST ESTROGENS-METHYLTEST 0.625-1.25 MG PO TABS: 1.0000 | ORAL_TABLET | Freq: Every day | ORAL | Status: AC

## 2014-02-02 NOTE — Patient Instructions (Addendum)
Hold off on flu shot with URI. Breast ultrasound. Diet and exercise. Return in one year or as needed.

## 2014-02-02 NOTE — Telephone Encounter (Signed)
Patient referred to [facility] for a diagnostic mammogram and ultrasound of her left breast due to a mass.  Appt is 02/13/2014 at 1pm.

## 2014-02-13 ENCOUNTER — Ambulatory Visit
Admission: RE | Admit: 2014-02-13 | Discharge: 2014-02-13 | Disposition: A | Discharge status: Home / Self Care | Payer: PRIVATE HEALTH INSURANCE | Source: Ambulatory Visit | Attending: Internal Medicine | Admitting: Internal Medicine

## 2014-02-13 DIAGNOSIS — N6320 Unspecified lump in the left breast, unspecified quadrant: Secondary | ICD-10-CM

## 2014-02-14 ENCOUNTER — Ambulatory Visit (INDEPENDENT_AMBULATORY_CARE_PROVIDER_SITE_OTHER): Payer: PRIVATE HEALTH INSURANCE | Admitting: Internal Medicine

## 2014-02-14 DIAGNOSIS — Z23 Encounter for immunization: Secondary | ICD-10-CM

## 2014-02-27 ENCOUNTER — Ambulatory Visit: Payer: PRIVATE HEALTH INSURANCE | Admitting: Neurology

## 2014-02-27 ENCOUNTER — Telehealth: Payer: Self-pay | Admitting: Neurology

## 2014-02-27 NOTE — Telephone Encounter (Signed)
Pt canceled new patient appt to see Dr Karel JarvisAquino and notified the referring dr by epic

## 2014-04-05 ENCOUNTER — Other Ambulatory Visit: Payer: Self-pay

## 2014-04-05 DIAGNOSIS — Z1231 Encounter for screening mammogram for malignant neoplasm of breast: Secondary | ICD-10-CM

## 2014-04-22 NOTE — Progress Notes (Signed)
   Subjective:    Patient ID: Karen [name], female    DOB: 08/07/1955, 58 y.o.   MRN: [id number]  HPI  59 year old White Female transferring from Dr. Debby Bud  who has retired. He was initially seen here in August for some odd symptoms of numbness in her hand and arm that subsequently resolved spontaneously. Had been under stress part of those symptoms developing.  Past medical history: Pneumonia in 1993, pneumonia 2003, Herpes zoster 1971. Torn meniscus right knee 1974 requiring surgery, torn anterior cruciate ligament right knee 1995 requiring surgery. Tracheostomy 1959, bilateral breast reduction 1976, hysterectomy 2002.   She is allergic Penicillin. She's allergic to adhesive tape.  One pregnancy, no miscarriages.  Patient says she had sigmoidoscopy/colonoscopy at age 32 and 66 in [place] in 2006 and 2011.  She is on estrogen replacement therapy per OB/GYN. She uses an EpiPen when necessary. Takes Valium 2 mg when necessary. She takes calcium vitamin C and when necessary Advil. She also takes a multivitamin.  Social history: She is married and has a Naval architect. 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 She has one daughter age 56 who is an actress. No alcohol consumption. Husband is employed by [employer].  Family history: Father with history of MI and hypertension. Mother in good health at age 52. 3 brothers. One brother died of HIV/AIDS at age 57. 2 brothers ages 61 and 40 in good health.  Recent URI symptoms. Thinks they are resolving. Does not want antibiotics.  Review of Systems  Constitutional: Negative.   All other systems reviewed and are negative.       Objective:   Physical Exam  Constitutional: She is oriented to person, place, and time. She appears well-developed and well-nourished. No distress.  HENT:  Head: Normocephalic and atraumatic.  Right Ear: External ear normal.  Left Ear: External ear normal.  Mouth/Throat: No  oropharyngeal exudate.  Eyes: Conjunctivae and EOM are normal. Pupils are equal, round, and reactive to light. Right eye exhibits no discharge. Left eye exhibits no discharge. No scleral icterus.  Neck: Normal range of motion. Neck supple. No JVD present. No thyromegaly present.  Cardiovascular: Normal rate, regular rhythm and normal heart sounds.   No murmur heard. Pulmonary/Chest: Effort normal and breath sounds normal. No respiratory distress. She has no wheezes. She has no rales. She exhibits no tenderness.  Small left breast mass in the lower medial breast  Abdominal: Soft. Bowel sounds are normal. She exhibits no distension and no mass. There is no rebound and no guarding.  Genitourinary:  Deferred to GYN  Musculoskeletal: Normal range of motion. She exhibits no edema.  Lymphadenopathy:    She has no cervical adenopathy.  Neurological: She is alert and oriented to person, place, and time. She has normal reflexes. She displays normal reflexes. No cranial nerve deficit. Coordination normal.  Skin: Skin is warm and dry. No rash noted.  Psychiatric: She has a normal mood and affect. Her behavior is normal. Judgment and thought content normal.  Vitals reviewed.         Assessment & Plan:  Normal health maintenance exam  Recent URI-continue to monitor  Left breast mass-order breast ultrasound  Labs from August show elevated LDL cholesterol of 119. Recommend diet and exercise and return in one year.

## 2014-05-01 ENCOUNTER — Encounter: Payer: Self-pay | Admitting: Internal Medicine

## 2014-05-21 ENCOUNTER — Ambulatory Visit
Admission: RE | Admit: 2014-05-21 | Discharge: 2014-05-21 | Disposition: A | Discharge status: Home / Self Care | Payer: PRIVATE HEALTH INSURANCE | Source: Ambulatory Visit

## 2014-05-21 DIAGNOSIS — Z1231 Encounter for screening mammogram for malignant neoplasm of breast: Secondary | ICD-10-CM

## 2014-09-20 ENCOUNTER — Encounter: Payer: Self-pay | Admitting: Internal Medicine

## 2014-09-20 ENCOUNTER — Ambulatory Visit (INDEPENDENT_AMBULATORY_CARE_PROVIDER_SITE_OTHER): Payer: PRIVATE HEALTH INSURANCE | Admitting: Internal Medicine

## 2014-09-20 VITALS — BP 100/62 | HR 59 | Temp 97.8°F | Ht 64.0 in | Wt 119.5 lb

## 2014-09-20 DIAGNOSIS — M6248 Contracture of muscle, other site: Secondary | ICD-10-CM | POA: Diagnosis not present

## 2014-09-20 DIAGNOSIS — L739 Follicular disorder, unspecified: Secondary | ICD-10-CM

## 2014-09-20 DIAGNOSIS — M62838 Other muscle spasm: Secondary | ICD-10-CM

## 2014-09-20 MED ORDER — DIAZEPAM 2 MG PO TABS: 2.0000 mg | ORAL_TABLET | ORAL | Status: DC | PRN

## 2014-10-04 ENCOUNTER — Ambulatory Visit (INDEPENDENT_AMBULATORY_CARE_PROVIDER_SITE_OTHER): Payer: PRIVATE HEALTH INSURANCE | Admitting: Internal Medicine

## 2014-10-04 ENCOUNTER — Encounter: Payer: Self-pay | Admitting: Internal Medicine

## 2014-10-04 VITALS — BP 110/72 | HR 70 | Temp 97.3°F | Wt 118.0 lb

## 2014-10-04 DIAGNOSIS — J069 Acute upper respiratory infection, unspecified: Secondary | ICD-10-CM

## 2014-10-04 MED ORDER — BENZONATATE 200 MG PO CAPS: 200.0000 mg | ORAL_CAPSULE | Freq: Three times a day (TID) | ORAL | Status: DC | PRN

## 2014-10-04 MED ORDER — AZITHROMYCIN 250 MG PO TABS: ORAL_TABLET | ORAL | Status: DC

## 2014-10-04 NOTE — Progress Notes (Signed)
   Subjective:    Patient ID: Karen [name], female    DOB: 1955-04-25, 59 y.o.   MRN: [id number]  HPI Onset weekend of June 11 of acute URI symptoms. No fever or shaking chills. Developed cough Cough. No sore throat. No ear pain. Now has discolored sputum production and cannot quit coughing. History of mycoplasma pneumonia 2013 treated by Dr. Debby Bud with Zithromax    Review of Systems no headache or myalgias. No respiratory distress     Objective:   Physical Exam  Skin warm and dry. Nodes none. TMs are slightly dull bilaterally but not red. Pharynx slightly injected without exudate. Neck is supple. Chest clear to auscultation without rales, wheezing or rhonchi      Assessment & Plan:    Acute URI  Tessalon perles 200 mg 3 times daily as needed for cough Zithromax 2 po day 1 then one po days 2-5

## 2014-10-14 DIAGNOSIS — M62838 Other muscle spasm: Secondary | ICD-10-CM | POA: Insufficient documentation

## 2014-10-14 NOTE — Patient Instructions (Signed)
Take Valium sparingly for neck spasm. Triamcinolone cream apply to scalp lesion 3 times daily until healed

## 2014-10-14 NOTE — Progress Notes (Signed)
   Subjective:    Patient ID: Karen [name], female    DOB: 24-Apr-1955, 59 y.o.   MRN: [id number]  HPI  59 year old Female who takes Valium on occasion for musculoskeletal pain/spasm in her neck. This works well for her and she uses it sparingly. Needs refill on the medication and that is why she's here today mainly. Also has felt a lesion in the right occipital area of her head. She is concerned about it but really can't see it because it's in the back of her head.    Review of Systems     Objective:   Physical Exam She has an erythematous small lesion right occipital area that looks to be an area of folliculitis. There is a hair in the center of the lesion. Lesion is approximately 2-3 mm in diameter. Currently has no neck spasm       Assessment & Plan:  Most of skeletal pain of neck  Folliculitis right occipital scalp  Plan: Triamcinolone cream 0.1% apply to scalp lesion 3 times daily until healed. New prescription for Valium to take as needed for neck spasm

## 2015-02-05 ENCOUNTER — Encounter: Payer: Self-pay | Admitting: Internal Medicine

## 2015-02-05 ENCOUNTER — Ambulatory Visit (INDEPENDENT_AMBULATORY_CARE_PROVIDER_SITE_OTHER): Payer: PRIVATE HEALTH INSURANCE | Admitting: Internal Medicine

## 2015-02-05 VITALS — BP 100/68 | HR 57 | Temp 97.8°F | Ht 64.0 in | Wt 118.0 lb

## 2015-02-05 DIAGNOSIS — Z23 Encounter for immunization: Secondary | ICD-10-CM

## 2015-03-12 ENCOUNTER — Other Ambulatory Visit: Payer: PRIVATE HEALTH INSURANCE | Admitting: Internal Medicine

## 2015-03-21 ENCOUNTER — Other Ambulatory Visit: Payer: PRIVATE HEALTH INSURANCE | Admitting: Internal Medicine

## 2015-03-21 DIAGNOSIS — Z130 Encounter for screening for diseases of the blood and blood-forming organs and certain disorders involving the immune mechanism: Secondary | ICD-10-CM

## 2015-03-21 DIAGNOSIS — E7800 Pure hypercholesterolemia, unspecified: Secondary | ICD-10-CM

## 2015-03-21 DIAGNOSIS — Z1321 Encounter for screening for nutritional disorder: Secondary | ICD-10-CM

## 2015-03-21 DIAGNOSIS — Z0000 Encounter for general adult medical examination without abnormal findings: Secondary | ICD-10-CM

## 2015-03-21 DIAGNOSIS — Z1329 Encounter for screening for other suspected endocrine disorder: Secondary | ICD-10-CM

## 2015-03-21 LAB — COMPLETE METABOLIC PANEL WITH GFR
ALK PHOS: 40 U/L (ref 33–130)
ALT: 15 U/L (ref 6–29)
AST: 22 U/L (ref 10–35)
Albumin: 3.7 g/dL (ref 3.6–5.1)
BILIRUBIN TOTAL: 0.6 mg/dL (ref 0.2–1.2)
BUN: 20 mg/dL (ref 7–25)
CALCIUM: 8.9 mg/dL (ref 8.6–10.4)
CO2: 25 mmol/L (ref 20–31)
CREATININE: 0.94 mg/dL (ref 0.50–0.99)
Chloride: 102 mmol/L (ref 98–110)
GFR, Est African American: 76 mL/min (ref >=60)
GFR, Est Non African American: 66 mL/min (ref >=60)
GLUCOSE: 87 mg/dL (ref 65–99)
POTASSIUM: 4.5 mmol/L (ref 3.5–5.3)
Sodium: 136 mmol/L (ref 135–146)
Total Protein: 6.7 g/dL (ref 6.1–8.1)

## 2015-03-21 LAB — CBC WITH DIFFERENTIAL/PLATELET
Basophils Absolute: 0 10*3/uL (ref 0.0–0.1)
Basophils Relative: 0 % (ref 0–1)
Eosinophils Absolute: 0.1 10*3/uL (ref 0.0–0.7)
Eosinophils Relative: 2 % (ref 0–5)
HEMATOCRIT: 39.2 % (ref 36.0–46.0)
Hemoglobin: 13.1 g/dL (ref 12.0–15.0)
LYMPHS ABS: 2.1 10*3/uL (ref 0.7–4.0)
LYMPHS PCT: 41 % (ref 12–46)
MCH: 30.8 pg (ref 26.0–34.0)
MCHC: 33.4 g/dL (ref 30.0–36.0)
MCV: 92 fL (ref 78.0–100.0)
MONO ABS: 0.4 10*3/uL (ref 0.1–1.0)
MPV: 11.1 fL (ref 8.6–12.4)
Monocytes Relative: 8 % (ref 3–12)
Neutro Abs: 2.5 10*3/uL (ref 1.7–7.7)
Neutrophils Relative %: 49 % (ref 43–77)
PLATELETS: 225 10*3/uL (ref 150–400)
RBC: 4.26 MIL/uL (ref 3.87–5.11)
RDW: 13.3 % (ref 11.5–15.5)
WBC: 5.1 10*3/uL (ref 4.0–10.5)

## 2015-03-21 LAB — LIPID PANEL
Cholesterol: 223 mg/dL — ABNORMAL HIGH (ref 125–200)
HDL: 76 mg/dL (ref >=46)
LDL Cholesterol: 138 mg/dL — ABNORMAL HIGH (ref <130)
Total CHOL/HDL Ratio: 2.9 Ratio (ref <=5.0)
Triglycerides: 46 mg/dL (ref <150)
VLDL: 9 mg/dL (ref <30)

## 2015-03-21 LAB — TSH: TSH: 2.305 u[IU]/mL (ref 0.350–4.500)

## 2015-03-22 LAB — VITAMIN D 25 HYDROXY (VIT D DEFICIENCY, FRACTURES): VIT D 25 HYDROXY: 52 ng/mL (ref 30–100)

## 2015-03-26 ENCOUNTER — Ambulatory Visit (INDEPENDENT_AMBULATORY_CARE_PROVIDER_SITE_OTHER): Payer: PRIVATE HEALTH INSURANCE | Admitting: Internal Medicine

## 2015-03-26 ENCOUNTER — Encounter: Payer: Self-pay | Admitting: Internal Medicine

## 2015-03-26 VITALS — BP 114/70 | HR 60 | Temp 97.0°F | Resp 20 | Ht 64.0 in | Wt 124.0 lb

## 2015-03-26 DIAGNOSIS — Z5181 Encounter for therapeutic drug level monitoring: Secondary | ICD-10-CM | POA: Diagnosis not present

## 2015-03-26 DIAGNOSIS — M542 Cervicalgia: Secondary | ICD-10-CM

## 2015-03-26 DIAGNOSIS — Z0000 Encounter for general adult medical examination without abnormal findings: Secondary | ICD-10-CM

## 2015-03-26 DIAGNOSIS — Z79890 Hormone replacement therapy (postmenopausal): Secondary | ICD-10-CM

## 2015-03-26 DIAGNOSIS — E785 Hyperlipidemia, unspecified: Secondary | ICD-10-CM | POA: Diagnosis not present

## 2015-03-26 MED ORDER — DIAZEPAM 2 MG PO TABS: 2.0000 mg | ORAL_TABLET | Freq: Three times a day (TID) | ORAL | Status: DC | PRN

## 2015-03-29 ENCOUNTER — Telehealth: Payer: Self-pay | Admitting: General Practice

## 2015-03-29 NOTE — Telephone Encounter (Signed)
OK. Thx

## 2015-03-29 NOTE — Telephone Encounter (Signed)
Left patient vm to call back to schedule.  °

## 2015-03-29 NOTE — Telephone Encounter (Signed)
Use to be a patient of Dr. Debby BudNorins.  States husband is a patient of Dr. Posey ReaPlotnikov and Dr. Gabriel RungJoe and Dr. Victorino DikeSam [place] recommended Dr. Posey ReaPlotnikov.  Patient is requesting Dr. Posey ReaPlotnikov to take on as patient.

## 2015-04-20 NOTE — Patient Instructions (Signed)
Watch diet and exercise. Return in one year or as needed. May want repeat lipid panel in 6 months. Total cholesterol and LDL are elevated from previous study a year ago

## 2015-04-20 NOTE — Progress Notes (Signed)
   Subjective:    Patient ID: Karen [name], female    DOB: 12-18-1955, 59 y.o.   MRN: [id number]  HPI 59 year old Female in today for health maintenance exam and evaluation of medical issues. She has neck pain on occasion treated with Valium. Formerly seen by Dr. Debby BudNorins who retired.  Past medical history: Pneumonia in 1993 and in 2003. Herpes zoster 1971. Torn meniscus right knee 1974 requiring surgery. Torn anterior cruciate ligament right knee 1995 requiring surgery. Tracheostomy 1959. Bilateral breast reduction 1976. Hysterectomy 2002.  She is allergic penicillin. She is allergic to adhesive tape.  One pregnancy, no miscarriages.  Patient says she had sigmoidoscopy/colonoscopy at age 59 and age 59 [place] in 2006 and 2011. We do not have those reports.  She is on estrogen replacement therapy per GYN. She uses EpiPen when necessary. She takes a multivitamin, calcium, vitamin C and when necessary Advil.  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 She has one daughter in her 7420s who is an Musicianactress. Patient does not consume alcohol. Husband is employed by [employer].  Family history: Father with history of MI and hypertension. Mother in good health in her 7180s. 3 brothers. One brother died of HIV/age at the age of 60. 2 brothers in good health.     Review of Systems  Constitutional: Negative.   All other systems reviewed and are negative.      Objective:   Physical Exam  Constitutional: She is oriented to person, place, and time. She appears well-developed and well-nourished.  HENT:  Head: Normocephalic and atraumatic.  Right Ear: External ear normal.  Left Ear: External ear normal.  Mouth/Throat: Oropharynx is clear and moist. No oropharyngeal exudate.  Eyes: Conjunctivae and EOM are normal. Pupils are equal, round, and reactive to light. Right eye exhibits no discharge. Left eye  exhibits no discharge. No scleral icterus.  Neck: Neck supple. No JVD present. No thyromegaly present.  Cardiovascular: Normal rate, regular rhythm, normal heart sounds and intact distal pulses.   No murmur heard. Pulmonary/Chest: Effort normal and breath sounds normal. No respiratory distress. She has no wheezes. She has no rales.  Breasts normal female without masses  Abdominal: Bowel sounds are normal. She exhibits no distension and no mass. There is no tenderness. There is no rebound and no guarding.  Genitourinary:  Deferred to GYN  Musculoskeletal: She exhibits no edema.  Lymphadenopathy:    She has no cervical adenopathy.  Neurological: She is alert and oriented to person, place, and time. She has normal reflexes. No cranial nerve deficit.  Skin: Skin is warm and dry. No rash noted.  Psychiatric: She has a normal mood and affect. Her behavior is normal. Judgment and thought content normal.  Vitals reviewed.         Assessment & Plan:  Musculoskeletal neck pain treated with when necessary Valium 2 mg sparingly  Estrogen replacement  Hyperlipidemia-total cholesterol 223 with LDL cholesterol 138. This represents an increase from a year ago. Recommend diet and exercise.  Plan:  Return in one year or as needed. Recommend diet and exercise for elevated cholesterol. May want repeat lipid panel in 6 months.

## 2015-04-30 ENCOUNTER — Other Ambulatory Visit: Payer: Self-pay

## 2015-04-30 DIAGNOSIS — Z1231 Encounter for screening mammogram for malignant neoplasm of breast: Secondary | ICD-10-CM

## 2015-05-02 ENCOUNTER — Ambulatory Visit (INDEPENDENT_AMBULATORY_CARE_PROVIDER_SITE_OTHER): Payer: PRIVATE HEALTH INSURANCE | Admitting: Internal Medicine

## 2015-05-02 ENCOUNTER — Encounter: Payer: Self-pay | Admitting: Internal Medicine

## 2015-05-02 VITALS — BP 116/74 | HR 74 | Ht 64.0 in | Wt 123.0 lb

## 2015-05-02 DIAGNOSIS — T782XXS Anaphylactic shock, unspecified, sequela: Secondary | ICD-10-CM

## 2015-05-02 DIAGNOSIS — E8940 Asymptomatic postprocedural ovarian failure: Secondary | ICD-10-CM

## 2015-05-02 DIAGNOSIS — Z1211 Encounter for screening for malignant neoplasm of colon: Secondary | ICD-10-CM | POA: Insufficient documentation

## 2015-05-02 DIAGNOSIS — M62838 Other muscle spasm: Secondary | ICD-10-CM

## 2015-05-02 DIAGNOSIS — T782XXA Anaphylactic shock, unspecified, initial encounter: Secondary | ICD-10-CM | POA: Insufficient documentation

## 2015-05-02 DIAGNOSIS — M6248 Contracture of muscle, other site: Secondary | ICD-10-CM | POA: Diagnosis not present

## 2015-05-02 DIAGNOSIS — N958 Other specified menopausal and perimenopausal disorders: Secondary | ICD-10-CM | POA: Diagnosis not present

## 2015-05-02 MED ORDER — PSEUDOEPHEDRINE HCL 60 MG PO TABS: 120.0000 mg | ORAL_TABLET | ORAL | Status: DC | PRN

## 2015-05-02 MED ORDER — DIPHENHYDRAMINE HCL 25 MG PO TABS: 50.0000 mg | ORAL_TABLET | ORAL | Status: DC | PRN

## 2015-05-02 NOTE — Progress Notes (Signed)
Pre visit review using our clinic review tool, if applicable. No additional management support is needed unless otherwise documented below in the visit note. 

## 2015-05-02 NOTE — Progress Notes (Signed)
Subjective:  Patient ID: Karen [name], female    DOB: 1955-06-28  Age: 60 y.o. MRN: [id number]  CC: No chief complaint on file.   HPI Karen [name] presents for h/o idiopathic anaphylaxis 1994; occasional neck and back spasms - x years; menopause on HRT  Outpatient Prescriptions Prior to Visit  Medication Sig Dispense Refill  . Calcium-Vitamin D-Vitamin K (VIACTIV PO) Take 1 tablet by mouth 2 (two) times daily.    . diazepam (VALIUM) 2 MG tablet Take 1 tablet (2 mg total) by mouth every 8 (eight) hours as needed for anxiety. 60 tablet 0  . EPINEPHrine (EPI-PEN) 0.3 mg/0.3 mL DEVI Inject 0.3 mg into the muscle as needed. Allergic reactions...anaphylaxis    . estrogen-methylTESTOSTERone 0.625-1.25 MG per tablet Take 1 tablet by mouth daily. 90 tablet 3  . MULTIPLE VITAMINS PO Take by mouth.      . vitamin C (ASCORBIC ACID) 500 MG tablet Take 500 mg by mouth daily.       No facility-administered medications prior to visit.    ROS Review of Systems  Constitutional: Negative for chills, activity change, appetite change, fatigue and unexpected weight change.  HENT: Negative for congestion, mouth sores and sinus pressure.   Eyes: Negative for visual disturbance.  Respiratory: Negative for cough and chest tightness.   Gastrointestinal: Negative for nausea and abdominal pain.  Genitourinary: Negative for frequency, difficulty urinating and vaginal pain.  Musculoskeletal: Negative for back pain and gait problem.  Skin: Negative for pallor and rash.  Neurological: Negative for dizziness, tremors, weakness, numbness and headaches.  Psychiatric/Behavioral: Negative for suicidal ideas, confusion and sleep disturbance. The patient is not nervous/anxious.     Objective:  BP 116/74 mmHg  Pulse 74  Ht 5\' 4"  (1.626 m)  Wt 123 lb (55.792 kg)  BMI 21.10 kg/m2  SpO2 99%  BP Readings from Last 3 Encounters:  05/02/15 116/74  03/26/15 114/70  02/05/15 100/68    Wt Readings from Last 3  Encounters:  05/02/15 123 lb (55.792 kg)  03/26/15 124 lb (56.246 kg)  02/05/15 118 lb (53.524 kg)    Physical Exam  Constitutional: She appears well-developed. No distress.  HENT:  Head: Normocephalic.  Right Ear: External ear normal.  Left Ear: External ear normal.  Nose: Nose normal.  Mouth/Throat: Oropharynx is clear and moist.  Eyes: Conjunctivae are normal. Pupils are equal, round, and reactive to light. Right eye exhibits no discharge. Left eye exhibits no discharge.  Neck: Normal range of motion. Neck supple. No JVD present. No tracheal deviation present. No thyromegaly present.  Cardiovascular: Normal rate, regular rhythm and normal heart sounds.   Pulmonary/Chest: No stridor. No respiratory distress. She has no wheezes.  Abdominal: Soft. Bowel sounds are normal. She exhibits no distension and no mass. There is no tenderness. There is no rebound and no guarding.  Musculoskeletal: She exhibits no edema or tenderness.  Lymphadenopathy:    She has no cervical adenopathy.  Neurological: She displays normal reflexes. No cranial nerve deficit. She exhibits normal muscle tone. Coordination normal.  Skin: No rash noted. No erythema.  Psychiatric: She has a normal mood and affect. Her behavior is normal. Judgment and thought content normal.    Lab Results  Component Value Date   WBC 5.1 03/21/2015   HGB 13.1 03/21/2015   HCT 39.2 03/21/2015   PLT 225 03/21/2015   GLUCOSE 87 03/21/2015   CHOL 223* 03/21/2015   TRIG 46 03/21/2015   HDL 76 03/21/2015   LDLDIRECT 134.0  01/25/2012   LDLCALC 138* 03/21/2015   ALT 15 03/21/2015   AST 22 03/21/2015   NA 136 03/21/2015   K 4.5 03/21/2015   CL 102 03/21/2015   CREATININE 0.94 03/21/2015   BUN 20 03/21/2015   CO2 25 03/21/2015   TSH 2.305 03/21/2015   INR 0.97 08/01/2011    Mm Digital Screening Bilateral  05/21/2014  CLINICAL DATA:  Screening. EXAM: DIGITAL SCREENING BILATERAL MAMMOGRAM WITH CAD COMPARISON:  Previous exam(s).  ACR Breast Density Category b: There are scattered areas of fibroglandular density. FINDINGS: There are no findings suspicious for malignancy. Images were processed with CAD. IMPRESSION: No mammographic evidence of malignancy. A result letter of this screening mammogram will be mailed directly to the patient. RECOMMENDATION: Screening mammogram in one year. (Code:SM-B-01Y) BI-RADS CATEGORY  1: Negative. Electronically Signed   By: Baird Lyons M.D.   On: 05/21/2014 11:03    Assessment & Plan:   Diagnoses and all orders for this visit:  Anaphylactic reaction, sequela  Climacteric  Screen for colon cancer  Muscle spasms of neck  Other orders -     pseudoephedrine (SUDAFED) 60 MG tablet; Take 2 tablets (120 mg total) by mouth every 4 (four) hours as needed (allergic reaction). -     diphenhydrAMINE (BENADRYL) 25 MG tablet; Take 2 tablets (50 mg total) by mouth every 4 (four) hours as needed for allergies.   I am having Karen [name] start on pseudoephedrine and diphenhydrAMINE. I am also having her maintain her vitamin C, MULTIPLE VITAMINS PO, EPINEPHrine, Calcium-Vitamin D-Vitamin K (VIACTIV PO), estrogen-methylTESTOSTERone, and diazepam.  Meds ordered this encounter  Medications  . pseudoephedrine (SUDAFED) 60 MG tablet    Sig: Take 2 tablets (120 mg total) by mouth every 4 (four) hours as needed (allergic reaction).    Dispense:  60 tablet    Refill:  1  . diphenhydrAMINE (BENADRYL) 25 MG tablet    Sig: Take 2 tablets (50 mg total) by mouth every 4 (four) hours as needed for allergies.    Dispense:  60 tablet    Refill:  1     Follow-up: Return in about 1 year (around 05/01/2016) for Wellness Exam.  Sonda Primes, MD

## 2015-05-02 NOTE — Assessment & Plan Note (Addendum)
1994 idiopathic w/o any repeat episodes Sudafed, Benadryl in a ziploc bag prn

## 2015-05-02 NOTE — Assessment & Plan Note (Signed)
On Diazepam prn  Potential benefits of a long term benzodiazepines  use as well as potential risks  and complications were explained to the patient and were aknowledged.   

## 2015-05-02 NOTE — Assessment & Plan Note (Signed)
Cologuard

## 2015-05-02 NOTE — Assessment & Plan Note (Signed)
on HRT 2002- estrotest Dr Henderson CloudHorvath

## 2015-05-23 ENCOUNTER — Ambulatory Visit
Admission: RE | Admit: 2015-05-23 | Discharge: 2015-05-23 | Disposition: A | Discharge status: Home / Self Care | Payer: PRIVATE HEALTH INSURANCE | Source: Ambulatory Visit

## 2015-05-23 DIAGNOSIS — Z1231 Encounter for screening mammogram for malignant neoplasm of breast: Secondary | ICD-10-CM

## 2015-06-13 ENCOUNTER — Ambulatory Visit
Admission: RE | Admit: 2015-06-13 | Discharge: 2015-06-13 | Disposition: A | Discharge status: Home / Self Care | Payer: PRIVATE HEALTH INSURANCE | Source: Ambulatory Visit

## 2015-08-06 ENCOUNTER — Encounter: Payer: Self-pay | Admitting: Internal Medicine

## 2015-08-06 ENCOUNTER — Ambulatory Visit (INDEPENDENT_AMBULATORY_CARE_PROVIDER_SITE_OTHER): Payer: PRIVATE HEALTH INSURANCE | Admitting: Internal Medicine

## 2015-08-06 VITALS — BP 100/74 | HR 70 | Temp 98.5°F | Wt 123.0 lb

## 2015-08-06 DIAGNOSIS — F4321 Adjustment disorder with depressed mood: Secondary | ICD-10-CM

## 2015-08-06 DIAGNOSIS — J309 Allergic rhinitis, unspecified: Secondary | ICD-10-CM | POA: Insufficient documentation

## 2015-08-06 DIAGNOSIS — J301 Allergic rhinitis due to pollen: Secondary | ICD-10-CM

## 2015-08-06 DIAGNOSIS — H1013 Acute atopic conjunctivitis, bilateral: Secondary | ICD-10-CM | POA: Diagnosis not present

## 2015-08-06 DIAGNOSIS — H1010 Acute atopic conjunctivitis, unspecified eye: Secondary | ICD-10-CM | POA: Insufficient documentation

## 2015-08-06 MED ORDER — FLUTICASONE PROPIONATE 50 MCG/ACT NA SUSP: 2.0000 | Freq: Every day | NASAL | Status: DC

## 2015-08-06 MED ORDER — OLOPATADINE HCL 0.1 % OP SOLN: 1.0000 [drp] | Freq: Two times a day (BID) | OPHTHALMIC | Status: DC

## 2015-08-06 MED ORDER — FEXOFENADINE HCL 180 MG PO TABS: 180.0000 mg | ORAL_TABLET | Freq: Every day | ORAL | Status: DC

## 2015-08-06 MED ORDER — ERYTHROMYCIN 5 MG/GM OP OINT: 1.0000 "application " | TOPICAL_OINTMENT | Freq: Every day | OPHTHALMIC | Status: DC

## 2015-08-06 NOTE — Progress Notes (Signed)
Pre visit review using our clinic review tool, if applicable. No additional management support is needed unless otherwise documented below in the visit note. 

## 2015-08-06 NOTE — Assessment & Plan Note (Addendum)
Seasonal Allegra, Flonase, Patanol Benadryl, Affrin Pt declined steroids [employer]

## 2015-08-06 NOTE — Assessment & Plan Note (Signed)
Patanol Erythro ophth oint if green crusting would not go away

## 2015-08-06 NOTE — Patient Instructions (Signed)
Use Afrin as needed Benadryl at night

## 2015-08-06 NOTE — Progress Notes (Signed)
Subjective:  Patient ID: Karen [name], female    DOB: [birth year]  Age: 60 y.o. MRN: [id number]  CC: Dry Eye and Allergic Rhinitis    HPI Karen [name] presents for allergy sx's - runny nose, itchy eyes since 07-03-2022. Father died in 03-Jul-2022 Eyes are itchy, pus in the eyes  Outpatient Prescriptions Prior to Visit  Medication Sig Dispense Refill  . Calcium-Vitamin D-Vitamin K (VIACTIV PO) Take 1 tablet by mouth 2 (two) times daily.    . diazepam (VALIUM) 2 MG tablet Take 1 tablet (2 mg total) by mouth every 8 (eight) hours as needed for anxiety. 60 tablet 0  . diphenhydrAMINE (BENADRYL) 25 MG tablet Take 2 tablets (50 mg total) by mouth every 4 (four) hours as needed for allergies. 60 tablet 1  . EPINEPHrine (EPI-PEN) 0.3 mg/0.3 mL DEVI Inject 0.3 mg into the muscle as needed. Allergic reactions...anaphylaxis    . estrogen-methylTESTOSTERone 0.625-1.25 MG per tablet Take 1 tablet by mouth daily. 90 tablet 3  . MULTIPLE VITAMINS PO Take by mouth.      . pseudoephedrine (SUDAFED) 60 MG tablet Take 2 tablets (120 mg total) by mouth every 4 (four) hours as needed (allergic reaction). 60 tablet 1  . vitamin C (ASCORBIC ACID) 500 MG tablet Take 500 mg by mouth daily.       No facility-administered medications prior to visit.    ROS Review of Systems  Constitutional: Negative for chills, activity change, appetite change, fatigue and unexpected weight change.  HENT: Negative for congestion, mouth sores and sinus pressure.   Eyes: Negative for visual disturbance.  Respiratory: Negative for cough and chest tightness.   Gastrointestinal: Negative for nausea and abdominal pain.  Genitourinary: Negative for frequency, difficulty urinating and vaginal pain.  Musculoskeletal: Negative for back pain and gait problem.  Skin: Negative for pallor and rash.  Neurological: Negative for dizziness, tremors, weakness, numbness and headaches.  Psychiatric/Behavioral: Negative for confusion and sleep disturbance.     Objective:  BP 100/74 mmHg  Pulse 70  Temp(Src) 98.5 F (36.9 C) (Oral)  Wt 123 lb (55.792 kg)  SpO2 98%  BP Readings from Last 3 Encounters:  08/06/15 100/74  05/02/15 116/74  03/26/15 114/70    Wt Readings from Last 3 Encounters:  08/06/15 123 lb (55.792 kg)  05/02/15 123 lb (55.792 kg)  03/26/15 124 lb (56.246 kg)    Physical Exam  Lab Results  Component Value Date   WBC 5.1 03/21/2015   HGB 13.1 03/21/2015   HCT 39.2 03/21/2015   PLT 225 03/21/2015   GLUCOSE 87 03/21/2015   CHOL 223* 03/21/2015   TRIG 46 03/21/2015   HDL 76 03/21/2015   LDLDIRECT 134.0 01/25/2012   LDLCALC 138* 03/21/2015   ALT 15 03/21/2015   AST 22 03/21/2015   NA 136 03/21/2015   K 4.5 03/21/2015   CL 102 03/21/2015   CREATININE 0.94 03/21/2015   BUN 20 03/21/2015   CO2 25 03/21/2015   TSH 2.305 03/21/2015   INR 0.97 08/01/2011    Mm Digital Screening Bilateral  06/14/2015  CLINICAL DATA:  Screening. EXAM: DIGITAL SCREENING BILATERAL MAMMOGRAM WITH CAD COMPARISON:  Previous exam(s). ACR Breast Density Category c: The breast tissue is heterogeneously dense, which may obscure small masses. FINDINGS: There are no findings suspicious for malignancy. Images were processed with CAD. IMPRESSION: No mammographic evidence of malignancy. A result letter of this screening mammogram will be mailed directly to the patient. RECOMMENDATION: Screening mammogram in one year. (Code:SM-B-01Y) BI-RADS  CATEGORY  1: Negative. Electronically Signed   By: Sherian ReinWei-Chen  Lin M.D.   On: 06/14/2015 09:33    Assessment & Plan:   There are no diagnoses linked to this encounter. I am having Ms. Helget maintain her vitamin C, MULTIPLE VITAMINS PO, EPINEPHrine, Calcium-Vitamin D-Vitamin K (VIACTIV PO), estrogen-methylTESTOSTERone, diazepam, pseudoephedrine, diphenhydrAMINE, and RA SUPHEDRINE.  Meds ordered this encounter  Medications  . RA SUPHEDRINE 30 MG tablet    Sig: as needed.    Refill:  0     Follow-up: No  Follow-up on file.  Sonda PrimesAlex Ermagene Saidi, MD

## 2015-08-06 NOTE — Assessment & Plan Note (Signed)
Father died 3/17 Discussed

## 2015-09-06 ENCOUNTER — Telehealth: Payer: Self-pay | Admitting: Internal Medicine

## 2015-09-06 NOTE — Telephone Encounter (Signed)
Patient had Lipid panel/labs only scheduled for 6/1 (no office visit).  Patient calling this morning to cancel this appointment.  Asked if she wanted to re-schedule and patient advised that she will not be having this appointment done.  She is going to be changing to a larger group of physicians.  [facility].  States that her husband is working in [place] again and is gone a good bit of the time.  Not sure what that has to do with her (maybe she goes with him some of the time).  States that with our office being a solo practice, she feels she needs a practice with better coverage.  She didn't have any complaints, is not upset and stated that she loves you to death.  Stated that her daughter sees you and will continue to do so.  She just needs to make this move for herself at this time.  I advised that I would make you aware of her update.    FYI.

## 2015-09-19 ENCOUNTER — Other Ambulatory Visit: Payer: PRIVATE HEALTH INSURANCE | Admitting: Internal Medicine

## 2016-01-01 ENCOUNTER — Other Ambulatory Visit (INDEPENDENT_AMBULATORY_CARE_PROVIDER_SITE_OTHER): Payer: PRIVATE HEALTH INSURANCE

## 2016-01-01 ENCOUNTER — Encounter: Payer: Self-pay | Admitting: Internal Medicine

## 2016-01-01 ENCOUNTER — Ambulatory Visit (INDEPENDENT_AMBULATORY_CARE_PROVIDER_SITE_OTHER): Payer: PRIVATE HEALTH INSURANCE | Admitting: Internal Medicine

## 2016-01-01 ENCOUNTER — Other Ambulatory Visit: Payer: Self-pay | Admitting: *Deleted

## 2016-01-01 ENCOUNTER — Ambulatory Visit (INDEPENDENT_AMBULATORY_CARE_PROVIDER_SITE_OTHER)
Admission: RE | Admit: 2016-01-01 | Discharge: 2016-01-01 | Disposition: A | Discharge status: Home / Self Care | Payer: PRIVATE HEALTH INSURANCE | Source: Ambulatory Visit | Attending: Internal Medicine | Admitting: Internal Medicine

## 2016-01-01 VITALS — BP 110/70 | HR 62 | Temp 98.8°F | Wt 120.0 lb

## 2016-01-01 DIAGNOSIS — Z23 Encounter for immunization: Secondary | ICD-10-CM | POA: Diagnosis not present

## 2016-01-01 DIAGNOSIS — R079 Chest pain, unspecified: Secondary | ICD-10-CM | POA: Diagnosis not present

## 2016-01-01 LAB — CBC WITH DIFFERENTIAL/PLATELET
Basophils Absolute: 0.1 K/uL (ref 0.0–0.1)
Basophils Relative: 1.1 % (ref 0.0–3.0)
Eosinophils Absolute: 0 K/uL (ref 0.0–0.7)
Eosinophils Relative: 0.8 % (ref 0.0–5.0)
HCT: 39.5 % (ref 36.0–46.0)
Hemoglobin: 13.6 g/dL (ref 12.0–15.0)
Lymphocytes Relative: 33.1 % (ref 12.0–46.0)
Lymphs Abs: 1.5 K/uL (ref 0.7–4.0)
MCHC: 34.5 g/dL (ref 30.0–36.0)
MCV: 92 fl (ref 78.0–100.0)
Monocytes Absolute: 0.4 K/uL (ref 0.1–1.0)
Monocytes Relative: 8.1 % (ref 3.0–12.0)
Neutro Abs: 2.6 K/uL (ref 1.4–7.7)
Neutrophils Relative %: 56.9 % (ref 43.0–77.0)
Platelets: 198 K/uL (ref 150.0–400.0)
RBC: 4.29 Mil/uL (ref 3.87–5.11)
RDW: 13.5 % (ref 11.5–15.5)
WBC: 4.7 K/uL (ref 4.0–10.5)

## 2016-01-01 LAB — SEDIMENTATION RATE: Sed Rate: 8 mm/h (ref 0–30)

## 2016-01-01 MED ORDER — DIAZEPAM 2 MG PO TABS: 2.0000 mg | ORAL_TABLET | Freq: Three times a day (TID) | ORAL | 1 refills | Status: DC | PRN

## 2016-01-01 MED ORDER — MELOXICAM 15 MG PO TABS: 15.0000 mg | ORAL_TABLET | Freq: Every day | ORAL | 1 refills | Status: DC

## 2016-01-01 NOTE — Assessment & Plan Note (Addendum)
?  MSK L CP x 3-4 weeks - it was severe 8/10 in the beginning x 2 weeks, now it is mild (4/10 - worse w/a seat belt pressure. H/o breast reduction - remote. H/o L rib fx - remote. The pt is L handed.  EKG CXR Breast exam Labs Meloxicam po qd pc prn

## 2016-01-01 NOTE — Progress Notes (Signed)
Pre visit review using our clinic review tool, if applicable. No additional management support is needed unless otherwise documented below in the visit note. 

## 2016-01-01 NOTE — Progress Notes (Signed)
Subjective:  Patient ID: Karen [name], female    DOB: Aug 24, 1955  Age: 60 y.o. MRN: [id number]  CC: No chief complaint on file.   HPI [name] presents for L CP x 3-4 weeks - it was severe 8/10 in the beginning x 2 weeks, now it is mild (4/10 - worse w/a seat belt pressure. H/o breast reduction - remote. H/o L rib fx - remote. The pt is L handed.  Outpatient Medications Prior to Visit  Medication Sig Dispense Refill  . Calcium-Vitamin D-Vitamin K (VIACTIV PO) Take 1 tablet by mouth 2 (two) times daily.    . diazepam (VALIUM) 2 MG tablet Take 1 tablet (2 mg total) by mouth every 8 (eight) hours as needed for anxiety. 60 tablet 0  . diphenhydrAMINE (BENADRYL) 25 MG tablet Take 2 tablets (50 mg total) by mouth every 4 (four) hours as needed for allergies. 60 tablet 1  . EPINEPHrine (EPI-PEN) 0.3 mg/0.3 mL DEVI Inject 0.3 mg into the muscle as needed. Allergic reactions...anaphylaxis    . erythromycin ophthalmic ointment Place 1 application into both eyes at bedtime. 3.5 g 0  . estrogen-methylTESTOSTERone 0.625-1.25 MG per tablet Take 1 tablet by mouth daily. 90 tablet 3  . fexofenadine (ALLEGRA) 180 MG tablet Take 1 tablet (180 mg total) by mouth daily. 100 tablet 2  . fluticasone (FLONASE) 50 MCG/ACT nasal spray Place 2 sprays into both nostrils daily. 16 g 6  . MULTIPLE VITAMINS PO Take by mouth.      Marland Kitchen olopatadine (PATANOL) 0.1 % ophthalmic solution Place 1 drop into both eyes 2 (two) times daily. 5 mL 1  . pseudoephedrine (SUDAFED) 60 MG tablet Take 2 tablets (120 mg total) by mouth every 4 (four) hours as needed (allergic reaction). 60 tablet 1  . RA SUPHEDRINE 30 MG tablet as needed.  0  . vitamin C (ASCORBIC ACID) 500 MG tablet Take 500 mg by mouth daily.       No facility-administered medications prior to visit.     ROS Review of Systems  Constitutional: Negative for activity change, appetite change, chills, fatigue and unexpected weight change.  HENT: Negative for congestion,  mouth sores and sinus pressure.   Eyes: Negative for visual disturbance.  Respiratory: Positive for chest tightness. Negative for cough, shortness of breath and wheezing.   Cardiovascular: Positive for chest pain.  Gastrointestinal: Negative for abdominal pain and nausea.  Genitourinary: Negative for difficulty urinating, frequency and vaginal pain.  Musculoskeletal: Negative for back pain and gait problem.  Skin: Negative for pallor and rash.  Neurological: Negative for dizziness, tremors, weakness, numbness and headaches.  Psychiatric/Behavioral: Negative for confusion and sleep disturbance.    Objective:  BP 110/70   Pulse 62   Temp 98.8 F (37.1 C) (Oral)   Wt 120 lb (54.4 kg)   SpO2 98%   BMI 20.60 kg/m   BP Readings from Last 3 Encounters:  01/01/16 110/70  08/06/15 100/74  05/02/15 116/74    Wt Readings from Last 3 Encounters:  01/01/16 120 lb (54.4 kg)  08/06/15 123 lb (55.8 kg)  05/02/15 123 lb (55.8 kg)    Physical Exam  Constitutional: She appears well-developed. No distress.  HENT:  Head: Normocephalic.  Right Ear: External ear normal.  Left Ear: External ear normal.  Nose: Nose normal.  Mouth/Throat: Oropharynx is clear and moist.  Eyes: Conjunctivae are normal. Pupils are equal, round, and reactive to light. Right eye exhibits no discharge. Left eye exhibits no discharge.  Neck:  Normal range of motion. Neck supple. No JVD present. No tracheal deviation present. No thyromegaly present.  Cardiovascular: Normal rate, regular rhythm and normal heart sounds.   Pulmonary/Chest: No stridor. No respiratory distress. She has no wheezes.  Abdominal: Soft. Bowel sounds are normal. She exhibits no distension and no mass. There is no tenderness. There is no rebound and no guarding.  Musculoskeletal: She exhibits tenderness. She exhibits no edema.  Lymphadenopathy:    She has no cervical adenopathy.  Neurological: She displays normal reflexes. No cranial nerve  deficit. She exhibits normal muscle tone. Coordination normal.  Skin: No rash noted. No erythema.  Psychiatric: She has a normal mood and affect. Her behavior is normal. Judgment and thought content normal.   B breast exam - WNL, no mass L anter chest wall - sensitive to palpation  Procedure: EKG Indication: chest pain Impression: NSR. No acute changes.   Lab Results  Component Value Date   WBC 5.1 03/21/2015   HGB 13.1 03/21/2015   HCT 39.2 03/21/2015   PLT 225 03/21/2015   GLUCOSE 87 03/21/2015   CHOL 223 (H) 03/21/2015   TRIG 46 03/21/2015   HDL 76 03/21/2015   LDLDIRECT 134.0 01/25/2012   LDLCALC 138 (H) 03/21/2015   ALT 15 03/21/2015   AST 22 03/21/2015   NA 136 03/21/2015   K 4.5 03/21/2015   CL 102 03/21/2015   CREATININE 0.94 03/21/2015   BUN 20 03/21/2015   CO2 25 03/21/2015   TSH 2.305 03/21/2015   INR 0.97 08/01/2011    Mm Digital Screening Bilateral  Result Date: 06/14/2015 CLINICAL DATA:  Screening. EXAM: DIGITAL SCREENING BILATERAL MAMMOGRAM WITH CAD COMPARISON:  Previous exam(s). ACR Breast Density Category c: The breast tissue is heterogeneously dense, which may obscure small masses. FINDINGS: There are no findings suspicious for malignancy. Images were processed with CAD. IMPRESSION: No mammographic evidence of malignancy. A result letter of this screening mammogram will be mailed directly to the patient. RECOMMENDATION: Screening mammogram in one year. (Code:SM-B-01Y) BI-RADS CATEGORY  1: Negative. Electronically Signed   By: Sherian ReinWei-Chen  Lin M.D.   On: 06/14/2015 09:33    Assessment & Plan:   There are no diagnoses linked to this encounter. I am having Ms. Thome maintain her vitamin C, MULTIPLE VITAMINS PO, EPINEPHrine, Calcium-Vitamin D-Vitamin K (VIACTIV PO), estrogen-methylTESTOSTERone, diazepam, pseudoephedrine, diphenhydrAMINE, RA SUPHEDRINE, olopatadine, fluticasone, fexofenadine, and erythromycin.  No orders of the defined types were placed in this  encounter.    Follow-up: No Follow-up on file.  Sonda PrimesAlex Awanda Wilcock, MD

## 2016-01-01 NOTE — Addendum Note (Signed)
Addended by: Merrilyn PumaSIMMONS, Yarelis Ambrosino N on: 01/01/2016 04:35 PM   Modules accepted: Orders

## 2016-01-02 LAB — CK TOTAL AND CKMB (NOT AT ARMC)
CK TOTAL: 94 U/L (ref 7–177)
CK, MB: 1.4 ng/mL (ref 0.0–5.0)
Relative Index: 1.5 (ref 0.0–4.0)

## 2016-01-02 LAB — D-DIMER, QUANTITATIVE (NOT AT ARMC)

## 2016-03-04 ENCOUNTER — Encounter: Payer: Self-pay | Admitting: Nurse Practitioner

## 2016-03-04 ENCOUNTER — Ambulatory Visit (INDEPENDENT_AMBULATORY_CARE_PROVIDER_SITE_OTHER): Payer: PRIVATE HEALTH INSURANCE | Admitting: Nurse Practitioner

## 2016-03-04 VITALS — BP 102/78 | Temp 98.0°F | Ht 64.0 in | Wt 122.0 lb

## 2016-03-04 DIAGNOSIS — K219 Gastro-esophageal reflux disease without esophagitis: Secondary | ICD-10-CM

## 2016-03-04 DIAGNOSIS — M62838 Other muscle spasm: Secondary | ICD-10-CM | POA: Diagnosis not present

## 2016-03-04 MED ORDER — OMEPRAZOLE 20 MG PO CPDR: 20.0000 mg | DELAYED_RELEASE_CAPSULE | Freq: Every day | ORAL | 0 refills | Status: DC

## 2016-03-04 MED ORDER — KETOROLAC TROMETHAMINE 30 MG/ML IJ SOLN: 30.0000 mg | Freq: Once | INTRAMUSCULAR | Status: DC

## 2016-03-04 MED ORDER — CYCLOBENZAPRINE HCL 5 MG PO TABS: 5.0000 mg | ORAL_TABLET | Freq: Three times a day (TID) | ORAL | 1 refills | Status: DC | PRN

## 2016-03-04 NOTE — Patient Instructions (Addendum)
Alternate between warm and cold compress as needed. Use meloxicam as prescribed. Do not take valium and flexeril at same time. Do not take flexeril and drive. Return to office if no improvement in 1week.  Muscle Cramps and Spasms Muscle cramps and spasms are when muscles tighten by themselves. They usually get better within minutes. Muscle cramps are painful. They are usually stronger and last longer than muscle spasms. Muscle spasms may or may not be painful. They can last a few seconds or much longer. HOME CARE  Drink enough fluid to keep your pee (urine) clear or pale yellow.  Massage, stretch, and relax the muscle.  Use a warm towel, heating pad, or warm shower water on tight muscles.  Place ice on the muscle if it is tender or in pain.  Put ice in a plastic bag.  Place a towel between your skin and the bag.  Leave the ice on for 15-20 minutes, 3-4 times a day.  Only take medicine as told by your doctor. GET HELP RIGHT AWAY IF:  Your cramps or spasms get worse, happen more often, or do not get better with time. MAKE SURE YOU:  Understand these instructions.  Will watch your condition.  Will get help right away if you are not doing well or get worse. This information is not intended to replace advice given to you by your health care provider. Make sure you discuss any questions you have with your health care provider. Document Released: 03/19/2008 Document Revised: 08/01/2012 Document Reviewed: 01/08/2015 Elsevier Interactive Patient Education  2017 [employer]

## 2016-03-04 NOTE — Progress Notes (Signed)
Pre visit review using our clinic review tool, if applicable. No additional management support is needed unless otherwise documented below in the visit note. 

## 2016-03-04 NOTE — Progress Notes (Signed)
Subjective:  Patient ID: Karen [name], female    DOB: [birth year]  Age: 60 y.o. MRN: [id number]  CC: Neck Pain (Pt stated very painful left side neck pain, hard to breath for 4 days)   Neck Pain   This is a new problem. The current episode started in the past 7 days. The problem occurs intermittently. The problem has been waxing and waning. The pain is associated with an unknown factor. The pain is present in the left side. The quality of the pain is described as burning and cramping. The pain is severe. The symptoms are aggravated by twisting and position. The pain is same all the time. Stiffness is present all day. Pertinent negatives include no chest pain, fever, headaches, leg pain, numbness, pain with swallowing, paresis, photophobia, syncope, tingling, trouble swallowing, visual change or weakness. She has tried heat for the symptoms. The treatment provided mild relief.    Outpatient Medications Prior to Visit  Medication Sig Dispense Refill  . Calcium-Vitamin D-Vitamin K (VIACTIV PO) Take 1 tablet by mouth 2 (two) times daily.    . diazepam (VALIUM) 2 MG tablet Take 1 tablet (2 mg total) by mouth every 8 (eight) hours as needed for anxiety. 60 tablet 1  . EPINEPHrine (EPI-PEN) 0.3 mg/0.3 mL DEVI Inject 0.3 mg into the muscle as needed. Allergic reactions...anaphylaxis    . erythromycin ophthalmic ointment Place 1 application into both eyes at bedtime. 3.5 g 0  . estrogen-methylTESTOSTERone 0.625-1.25 MG per tablet Take 1 tablet by mouth daily. 90 tablet 3  . fexofenadine (ALLEGRA) 180 MG tablet Take 1 tablet (180 mg total) by mouth daily. 100 tablet 2  . fluticasone (FLONASE) 50 MCG/ACT nasal spray Place 2 sprays into both nostrils daily. 16 g 6  . meloxicam (MOBIC) 15 MG tablet Take 1 tablet (15 mg total) by mouth daily. 30 tablet 1  . MULTIPLE VITAMINS PO Take by mouth.      Marland Kitchen. olopatadine (PATANOL) 0.1 % ophthalmic solution Place 1 drop into both eyes 2 (two) times daily. 5 mL 1  .  vitamin C (ASCORBIC ACID) 500 MG tablet Take 500 mg by mouth daily.      . diphenhydrAMINE (BENADRYL) 25 MG tablet Take 2 tablets (50 mg total) by mouth every 4 (four) hours as needed for allergies. 60 tablet 1  . pseudoephedrine (SUDAFED) 60 MG tablet Take 2 tablets (120 mg total) by mouth every 4 (four) hours as needed (allergic reaction). 60 tablet 1  . RA SUPHEDRINE 30 MG tablet as needed.  0   No facility-administered medications prior to visit.     ROS See HPI  Objective:  BP 102/78 (BP Location: Left Arm, Patient Position: Sitting, Cuff Size: Normal)   Temp 98 F (36.7 C)   Ht 5\' 4"  (1.626 m)   Wt 122 lb (55.3 kg)   SpO2 98%   BMI 20.94 kg/m   BP Readings from Last 3 Encounters:  03/04/16 102/78  01/01/16 110/70  08/06/15 100/74    Wt Readings from Last 3 Encounters:  03/04/16 122 lb (55.3 kg)  01/01/16 120 lb (54.4 kg)  08/06/15 123 lb (55.8 kg)    Physical Exam  Constitutional: She is oriented to person, place, and time. No distress.  Neck: No thyromegaly present.  Cardiovascular: Normal rate and normal heart sounds.   Pulmonary/Chest: Effort normal and breath sounds normal. She exhibits no tenderness.  Musculoskeletal:       Left shoulder: Normal.  Left elbow: Normal.       Left wrist: Normal.       Cervical back: She exhibits decreased range of motion.       Thoracic back: Normal.       Back:       Left upper arm: Normal.       Left forearm: Normal.       Left hand: Normal.  Lymphadenopathy:    She has no cervical adenopathy.  Neurological: She is alert and oriented to person, place, and time.  Skin: Skin is warm and dry. No rash noted. No erythema.  Vitals reviewed.   Lab Results  Component Value Date   WBC 4.7 01/01/2016   HGB 13.6 01/01/2016   HCT 39.5 01/01/2016   PLT 198.0 01/01/2016   GLUCOSE 87 03/21/2015   CHOL 223 (H) 03/21/2015   TRIG 46 03/21/2015   HDL 76 03/21/2015   LDLDIRECT 134.0 01/25/2012   LDLCALC 138 (H) 03/21/2015    ALT 15 03/21/2015   AST 22 03/21/2015   NA 136 03/21/2015   K 4.5 03/21/2015   CL 102 03/21/2015   CREATININE 0.94 03/21/2015   BUN 20 03/21/2015   CO2 25 03/21/2015   TSH 2.305 03/21/2015   INR 0.97 08/01/2011    Dg Chest 2 View  Result Date: 01/01/2016 CLINICAL DATA:  Left side chest pain. EXAM: CHEST  2 VIEW COMPARISON:  08/17/2011 FINDINGS: The heart size and mediastinal contours are within normal limits. Both lungs are clear. The visualized skeletal structures are unremarkable. IMPRESSION: No active cardiopulmonary disease. Electronically Signed   By: Charlett NoseKevin  Dover M.D.   On: 01/01/2016 14:02    Assessment & Plan:   Karen [name] was seen today for neck pain.  Diagnoses and all orders for this visit:  Muscle spasms of neck -     cyclobenzaprine (FLEXERIL) 5 MG tablet; Take 1 tablet (5 mg total) by mouth 3 (three) times daily as needed for muscle spasms. -     ketorolac (TORADOL) 30 MG/ML injection 30 mg; Inject 1 mL (30 mg total) into the muscle once.  Gastroesophageal reflux disease, esophagitis presence not specified -     omeprazole (PRILOSEC) 20 MG capsule; Take 1 capsule (20 mg total) by mouth daily.   I have discontinued Karen [name]'s pseudoephedrine, diphenhydrAMINE, and RA SUPHEDRINE. I am also having her start on omeprazole and cyclobenzaprine. Additionally, I am having her maintain her vitamin C, MULTIPLE VITAMINS PO, EPINEPHrine, Calcium-Vitamin D-Vitamin K (VIACTIV PO), estrogen-methylTESTOSTERone, olopatadine, fluticasone, fexofenadine, erythromycin, diazepam, and meloxicam. We will continue to administer ketorolac.  Meds ordered this encounter  Medications  . omeprazole (PRILOSEC) 20 MG capsule    Sig: Take 1 capsule (20 mg total) by mouth daily.    Dispense:  14 capsule    Refill:  0    Order Specific Question:   Supervising Provider    Answer:   Tresa GarterPLOTNIKOV, ALEKSEI V [[id number]]  . cyclobenzaprine (FLEXERIL) 5 MG tablet    Sig: Take 1 tablet (5 mg total) by mouth 3  (three) times daily as needed for muscle spasms.    Dispense:  30 tablet    Refill:  1    Order Specific Question:   Supervising Provider    Answer:   Tresa GarterPLOTNIKOV, ALEKSEI V [[id number]]  . ketorolac (TORADOL) 30 MG/ML injection 30 mg    Follow-up: Return if symptoms worsen or fail to improve.  Alysia Pennaharlotte Clare Casto, NP

## 2016-03-05 ENCOUNTER — Telehealth: Payer: Self-pay | Admitting: Nurse Practitioner

## 2016-03-05 DIAGNOSIS — M62838 Other muscle spasm: Secondary | ICD-10-CM | POA: Diagnosis not present

## 2016-03-05 MED ORDER — KETOROLAC TROMETHAMINE 60 MG/2ML IM SOLN
60.0000 mg | Freq: Once | INTRAMUSCULAR | Status: AC
  Administered 2016-03-05: 30 mg via INTRAMUSCULAR

## 2016-03-05 NOTE — Telephone Encounter (Signed)
Routing to [place]---do you have any other meds or suggestions for patient to manage pain?   Please advise, I will call patient back--thanks

## 2016-03-05 NOTE — Telephone Encounter (Signed)
Please prescription for oral prednisone 10mg  dose pack. Start today. Use tylenol 1000mg  TID prn. Alternate between warm anc cold compress prn

## 2016-03-05 NOTE — Telephone Encounter (Signed)
Patient is requesting call back.  [facility]  Patient states she still has the pain in her neck today and it is traveling.

## 2016-03-05 NOTE — Addendum Note (Signed)
Addended by: Shelly BombardPOLK, Malisha Mabey M on: 03/05/2016 09:37 AM   Modules accepted: Orders

## 2016-03-05 NOTE — Telephone Encounter (Signed)
Patient states she is in a lot of pain today

## 2016-03-06 ENCOUNTER — Encounter: Payer: Self-pay | Admitting: Nurse Practitioner

## 2016-03-06 NOTE — Telephone Encounter (Signed)
Advised patient of [facility]lottes note/patient is wanting to email/send messages via mychart to talk with [facility]lotte---not really fond of using steroids, but patient will email her questions on mychart---routing to [place], fyi.Marland Kitchen..Marland Kitchen

## 2016-03-24 ENCOUNTER — Other Ambulatory Visit: Payer: Self-pay | Admitting: Nurse Practitioner

## 2016-03-24 ENCOUNTER — Ambulatory Visit (INDEPENDENT_AMBULATORY_CARE_PROVIDER_SITE_OTHER)
Admission: RE | Admit: 2016-03-24 | Discharge: 2016-03-24 | Disposition: A | Discharge status: Home / Self Care | Payer: PRIVATE HEALTH INSURANCE | Source: Ambulatory Visit | Attending: Internal Medicine | Admitting: Internal Medicine

## 2016-03-24 ENCOUNTER — Encounter: Payer: Self-pay | Admitting: Internal Medicine

## 2016-03-24 ENCOUNTER — Ambulatory Visit (INDEPENDENT_AMBULATORY_CARE_PROVIDER_SITE_OTHER): Payer: PRIVATE HEALTH INSURANCE | Admitting: Internal Medicine

## 2016-03-24 VITALS — BP 120/70 | HR 68 | Temp 98.0°F | Wt 122.0 lb

## 2016-03-24 DIAGNOSIS — J040 Acute laryngitis: Secondary | ICD-10-CM | POA: Diagnosis not present

## 2016-03-24 DIAGNOSIS — M25512 Pain in left shoulder: Secondary | ICD-10-CM

## 2016-03-24 DIAGNOSIS — M62838 Other muscle spasm: Secondary | ICD-10-CM

## 2016-03-24 DIAGNOSIS — R079 Chest pain, unspecified: Secondary | ICD-10-CM

## 2016-03-24 DIAGNOSIS — M25519 Pain in unspecified shoulder: Secondary | ICD-10-CM | POA: Insufficient documentation

## 2016-03-24 NOTE — Assessment & Plan Note (Signed)
Dr Rosen 

## 2016-03-24 NOTE — Progress Notes (Signed)
Subjective:  Patient ID: Karen RaWendy Moen, female    DOB: 10/20/1955  Age: 60 y.o. MRN: [id number]  CC: No chief complaint on file.   HPI Karen [name] presents for severe L neck pain x 2-3 weeks. She is left handed C/o laryngitis - not better  Outpatient Medications Prior to Visit  Medication Sig Dispense Refill  . Calcium-Vitamin D-Vitamin K (VIACTIV PO) Take 1 tablet by mouth 2 (two) times daily.    . diazepam (VALIUM) 2 MG tablet Take 1 tablet (2 mg total) by mouth every 8 (eight) hours as needed for anxiety. 60 tablet 1  . EPINEPHrine (EPI-PEN) 0.3 mg/0.3 mL DEVI Inject 0.3 mg into the muscle as needed. Allergic reactions...anaphylaxis    . erythromycin ophthalmic ointment Place 1 application into both eyes at bedtime. 3.5 g 0  . estrogen-methylTESTOSTERone 0.625-1.25 MG per tablet Take 1 tablet by mouth daily. 90 tablet 3  . fexofenadine (ALLEGRA) 180 MG tablet Take 1 tablet (180 mg total) by mouth daily. 100 tablet 2  . fluticasone (FLONASE) 50 MCG/ACT nasal spray Place 2 sprays into both nostrils daily. 16 g 6  . MULTIPLE VITAMINS PO Take by mouth.      Marland Kitchen. olopatadine (PATANOL) 0.1 % ophthalmic solution Place 1 drop into both eyes 2 (two) times daily. 5 mL 1  . omeprazole (PRILOSEC) 20 MG capsule Take 1 capsule (20 mg total) by mouth daily. 14 capsule 0  . vitamin C (ASCORBIC ACID) 500 MG tablet Take 500 mg by mouth daily.      . cyclobenzaprine (FLEXERIL) 5 MG tablet Take 1 tablet (5 mg total) by mouth 3 (three) times daily as needed for muscle spasms. (Patient not taking: Reported on 03/24/2016) 30 tablet 1  . meloxicam (MOBIC) 15 MG tablet Take 1 tablet (15 mg total) by mouth daily. (Patient not taking: Reported on 03/24/2016) 30 tablet 1   No facility-administered medications prior to visit.     ROS Review of Systems  Constitutional: Negative for activity change, appetite change, chills, fatigue and unexpected weight change.  HENT: Negative for congestion, mouth sores and sinus  pressure.   Eyes: Negative for visual disturbance.  Respiratory: Negative for cough and chest tightness.   Cardiovascular: Negative for chest pain and palpitations.  Gastrointestinal: Negative for abdominal pain and nausea.  Genitourinary: Negative for difficulty urinating, frequency and vaginal pain.  Musculoskeletal: Positive for arthralgias, neck pain and neck stiffness. Negative for back pain and gait problem.  Skin: Negative for pallor and rash.  Neurological: Negative for dizziness, tremors, weakness, numbness and headaches.  Psychiatric/Behavioral: Negative for confusion and sleep disturbance.    Objective:  BP 120/70   Pulse 68   Temp 98 F (36.7 C) (Oral)   Wt 122 lb (55.3 kg)   SpO2 99%   BMI 20.94 kg/m   BP Readings from Last 3 Encounters:  03/24/16 120/70  03/04/16 102/78  01/01/16 110/70    Wt Readings from Last 3 Encounters:  03/24/16 122 lb (55.3 kg)  03/04/16 122 lb (55.3 kg)  01/01/16 120 lb (54.4 kg)    Physical Exam  Constitutional: She appears well-developed. No distress.  HENT:  Head: Normocephalic.  Right Ear: External ear normal.  Left Ear: External ear normal.  Nose: Nose normal.  Mouth/Throat: Oropharynx is clear and moist.  Eyes: Conjunctivae are normal. Pupils are equal, round, and reactive to light. Right eye exhibits no discharge. Left eye exhibits no discharge.  Neck: Normal range of motion. Neck supple. No JVD present.  No tracheal deviation present. No thyromegaly present.  Cardiovascular: Normal rate, regular rhythm and normal heart sounds.   Pulmonary/Chest: No stridor. No respiratory distress. She has no wheezes.  Abdominal: Soft. Bowel sounds are normal. She exhibits no distension and no mass. There is no tenderness. There is no rebound and no guarding.  Musculoskeletal: She exhibits tenderness. She exhibits no edema.  Lymphadenopathy:    She has no cervical adenopathy.  Neurological: She displays normal reflexes. No cranial nerve  deficit. She exhibits normal muscle tone. Coordination normal.  Skin: No rash noted. No erythema.  Psychiatric: She has a normal mood and affect. Her behavior is normal. Judgment and thought content normal.  L paraspinal muscles are tender MS OK  Lab Results  Component Value Date   WBC 4.7 01/01/2016   HGB 13.6 01/01/2016   HCT 39.5 01/01/2016   PLT 198.0 01/01/2016   GLUCOSE 87 03/21/2015   CHOL 223 (H) 03/21/2015   TRIG 46 03/21/2015   HDL 76 03/21/2015   LDLDIRECT 134.0 01/25/2012   LDLCALC 138 (H) 03/21/2015   ALT 15 03/21/2015   AST 22 03/21/2015   NA 136 03/21/2015   K 4.5 03/21/2015   CL 102 03/21/2015   CREATININE 0.94 03/21/2015   BUN 20 03/21/2015   CO2 25 03/21/2015   TSH 2.305 03/21/2015   INR 0.97 08/01/2011    Dg Chest 2 View  Result Date: 01/01/2016 CLINICAL DATA:  Left side chest pain. EXAM: CHEST  2 VIEW COMPARISON:  08/17/2011 FINDINGS: The heart size and mediastinal contours are within normal limits. Both lungs are clear. The visualized skeletal structures are unremarkable. IMPRESSION: No active cardiopulmonary disease. Electronically Signed   By: Charlett NoseKevin  Dover M.D.   On: 01/01/2016 14:02    Assessment & Plan:   There are no diagnoses linked to this encounter. I am having Ms. Zecca maintain her vitamin C, MULTIPLE VITAMINS PO, EPINEPHrine, Calcium-Vitamin D-Vitamin K (VIACTIV PO), estrogen-methylTESTOSTERone, olopatadine, fluticasone, fexofenadine, erythromycin, diazepam, meloxicam, omeprazole, and cyclobenzaprine.  No orders of the defined types were placed in this encounter.    Follow-up: No Follow-up on file.  Sonda PrimesAlex Aiesha Leland, MD

## 2016-03-24 NOTE — Progress Notes (Signed)
Pre visit review using our clinic review tool, if applicable. No additional management support is needed unless otherwise documented below in the visit note. 

## 2016-03-24 NOTE — Assessment & Plan Note (Signed)
[facility] vs other Cervical spine X ray Meloxicam Lighter purse Work ergonomics to improve PT MRI if not better Pt declined trigger point injections, steroids

## 2016-03-24 NOTE — Assessment & Plan Note (Signed)
Resolved

## 2016-03-24 NOTE — Telephone Encounter (Signed)
Routing to dr plotnikov---patient came in today for office visit, but you did not refill this med----do you want to refill med----please advise, thanks

## 2016-03-24 NOTE — Assessment & Plan Note (Signed)
X ray  Meloxicam

## 2016-04-24 ENCOUNTER — Ambulatory Visit: Payer: PRIVATE HEALTH INSURANCE | Admitting: Internal Medicine

## 2016-04-27 ENCOUNTER — Ambulatory Visit: Payer: PRIVATE HEALTH INSURANCE

## 2016-04-27 ENCOUNTER — Telehealth: Payer: Self-pay

## 2016-04-27 NOTE — Telephone Encounter (Signed)
Left message advising patient that a new shingles vaccine should be available at end of jan.---if she would like to wait until then or she can keep nurse visit scheduled for today to receive existing shingles vaccine (zostavax)---shingrix is supposed to be more effective, but I do not have it right now, expected to be here at end of jan/2018---can talk with tamara if any questions

## 2016-05-06 ENCOUNTER — Encounter: Payer: PRIVATE HEALTH INSURANCE | Admitting: Internal Medicine

## 2016-05-15 ENCOUNTER — Other Ambulatory Visit: Payer: Self-pay | Admitting: Internal Medicine

## 2016-05-15 ENCOUNTER — Other Ambulatory Visit: Payer: Self-pay | Admitting: Obstetrics and Gynecology

## 2016-05-15 DIAGNOSIS — Z1231 Encounter for screening mammogram for malignant neoplasm of breast: Secondary | ICD-10-CM

## 2016-05-27 ENCOUNTER — Encounter: Payer: Self-pay | Admitting: Internal Medicine

## 2016-05-27 ENCOUNTER — Ambulatory Visit (INDEPENDENT_AMBULATORY_CARE_PROVIDER_SITE_OTHER): Payer: PRIVATE HEALTH INSURANCE | Admitting: Internal Medicine

## 2016-05-27 ENCOUNTER — Other Ambulatory Visit (INDEPENDENT_AMBULATORY_CARE_PROVIDER_SITE_OTHER): Payer: PRIVATE HEALTH INSURANCE

## 2016-05-27 VITALS — BP 118/74 | HR 80 | Temp 98.0°F | Resp 20 | Wt 119.8 lb

## 2016-05-27 DIAGNOSIS — Z23 Encounter for immunization: Secondary | ICD-10-CM

## 2016-05-27 DIAGNOSIS — B353 Tinea pedis: Secondary | ICD-10-CM | POA: Diagnosis not present

## 2016-05-27 DIAGNOSIS — R202 Paresthesia of skin: Secondary | ICD-10-CM | POA: Diagnosis not present

## 2016-05-27 DIAGNOSIS — M79646 Pain in unspecified finger(s): Secondary | ICD-10-CM | POA: Diagnosis not present

## 2016-05-27 DIAGNOSIS — Z0000 Encounter for general adult medical examination without abnormal findings: Secondary | ICD-10-CM | POA: Diagnosis not present

## 2016-05-27 DIAGNOSIS — M62838 Other muscle spasm: Secondary | ICD-10-CM

## 2016-05-27 LAB — BASIC METABOLIC PANEL WITH GFR
BUN: 15 mg/dL (ref 6–23)
CO2: 29 meq/L (ref 19–32)
Calcium: 9.3 mg/dL (ref 8.4–10.5)
Chloride: 102 meq/L (ref 96–112)
Creatinine, Ser: 0.94 mg/dL (ref 0.40–1.20)
GFR: 64.21 mL/min
Glucose, Bld: 95 mg/dL (ref 70–99)
Potassium: 4 meq/L (ref 3.5–5.1)
Sodium: 136 meq/L (ref 135–145)

## 2016-05-27 LAB — URINALYSIS, ROUTINE W REFLEX MICROSCOPIC
BILIRUBIN URINE: NEGATIVE
Ketones, ur: NEGATIVE
Nitrite: NEGATIVE
Specific Gravity, Urine: 1.02 (ref 1.000–1.030)
Total Protein, Urine: NEGATIVE
URINE GLUCOSE: NEGATIVE
UROBILINOGEN UA: 0.2 (ref 0.0–1.0)
pH: 5.5 (ref 5.0–8.0)

## 2016-05-27 LAB — CBC WITH DIFFERENTIAL/PLATELET
BASOS PCT: 1.1 % (ref 0.0–3.0)
Basophils Absolute: 0.1 10*3/uL (ref 0.0–0.1)
EOS PCT: 2.5 % (ref 0.0–5.0)
Eosinophils Absolute: 0.1 10*3/uL (ref 0.0–0.7)
HCT: 41.4 % (ref 36.0–46.0)
Hemoglobin: 13.9 g/dL (ref 12.0–15.0)
LYMPHS ABS: 1.6 10*3/uL (ref 0.7–4.0)
Lymphocytes Relative: 33.3 % (ref 12.0–46.0)
MCHC: 33.6 g/dL (ref 30.0–36.0)
MCV: 94.5 fl (ref 78.0–100.0)
MONOS PCT: 8.4 % (ref 3.0–12.0)
Monocytes Absolute: 0.4 10*3/uL (ref 0.1–1.0)
NEUTROS ABS: 2.6 10*3/uL (ref 1.4–7.7)
NEUTROS PCT: 54.7 % (ref 43.0–77.0)
PLATELETS: 193 10*3/uL (ref 150.0–400.0)
RBC: 4.38 Mil/uL (ref 3.87–5.11)
RDW: 13.6 % (ref 11.5–15.5)
WBC: 4.7 10*3/uL (ref 4.0–10.5)

## 2016-05-27 LAB — LIPID PANEL
CHOLESTEROL: 206 mg/dL — AB (ref 0–200)
HDL: 81.9 mg/dL (ref >39.00)
LDL Cholesterol: 117 mg/dL — ABNORMAL HIGH (ref 0–99)
NonHDL: 124.03
Total CHOL/HDL Ratio: 3
Triglycerides: 35 mg/dL (ref 0.0–149.0)
VLDL: 7 mg/dL (ref 0.0–40.0)

## 2016-05-27 LAB — HEPATIC FUNCTION PANEL
ALT: 15 U/L (ref 0–35)
AST: 21 U/L (ref 0–37)
Albumin: 4.3 g/dL (ref 3.5–5.2)
Alkaline Phosphatase: 44 U/L (ref 39–117)
Bilirubin, Direct: 0.1 mg/dL (ref 0.0–0.3)
Total Bilirubin: 0.5 mg/dL (ref 0.2–1.2)
Total Protein: 7.3 g/dL (ref 6.0–8.3)

## 2016-05-27 LAB — VITAMIN B12: Vitamin B-12: 494 pg/mL (ref 211–911)

## 2016-05-27 LAB — TSH: TSH: 3.11 u[IU]/mL (ref 0.35–4.50)

## 2016-05-27 MED ORDER — KETOCONAZOLE 2 % EX CREA: 1.0000 "application " | TOPICAL_CREAM | Freq: Every day | CUTANEOUS | 1 refills | Status: AC

## 2016-05-27 NOTE — Assessment & Plan Note (Signed)
218

## 2016-05-27 NOTE — Progress Notes (Signed)
Subjective:  Patient ID: Karen [name], female    DOB: 06-19-1955  Age: 61 y.o. MRN: [id number]  CC: No chief complaint on file.   HPI Karen [name] presents for a well exam Neck pain is better C/o little toe is numb B x 1 year, itchy since October Blue dot on R 3d finger L eye is itching  Outpatient Medications Prior to Visit  Medication Sig Dispense Refill  . Calcium-Vitamin D-Vitamin K (VIACTIV PO) Take 1 tablet by mouth 2 (two) times daily.    . cyclobenzaprine (FLEXERIL) 5 MG tablet take 1 tablet by mouth three times a day if needed for muscle spasm 30 tablet 1  . diazepam (VALIUM) 2 MG tablet Take 1 tablet (2 mg total) by mouth every 8 (eight) hours as needed for anxiety. 60 tablet 1  . EPINEPHrine (EPI-PEN) 0.3 mg/0.3 mL DEVI Inject 0.3 mg into the muscle as needed. Allergic reactions...anaphylaxis    . erythromycin ophthalmic ointment Place 1 application into both eyes at bedtime. 3.5 g 0  . estrogen-methylTESTOSTERone 0.625-1.25 MG per tablet Take 1 tablet by mouth daily. 90 tablet 3  . fexofenadine (ALLEGRA) 180 MG tablet Take 1 tablet (180 mg total) by mouth daily. 100 tablet 2  . fluticasone (FLONASE) 50 MCG/ACT nasal spray Place 2 sprays into both nostrils daily. 16 g 6  . meloxicam (MOBIC) 15 MG tablet Take 1 tablet (15 mg total) by mouth daily. 30 tablet 1  . MULTIPLE VITAMINS PO Take by mouth.      Marland Kitchen. olopatadine (PATANOL) 0.1 % ophthalmic solution Place 1 drop into both eyes 2 (two) times daily. 5 mL 1  . omeprazole (PRILOSEC) 20 MG capsule Take 1 capsule (20 mg total) by mouth daily. 14 capsule 0  . vitamin C (ASCORBIC ACID) 500 MG tablet Take 500 mg by mouth daily.       No facility-administered medications prior to visit.     ROS Review of Systems  Constitutional: Negative for activity change, appetite change, chills, fatigue and unexpected weight change.  HENT: Negative for congestion, mouth sores and sinus pressure.   Eyes: Negative for visual disturbance.    Respiratory: Negative for cough and chest tightness.   Gastrointestinal: Negative for abdominal pain and nausea.  Genitourinary: Negative for difficulty urinating, frequency and vaginal pain.  Musculoskeletal: Negative for back pain and gait problem.  Skin: Positive for rash. Negative for pallor.  Neurological: Positive for numbness. Negative for dizziness, tremors, weakness and headaches.  Psychiatric/Behavioral: Negative for confusion, sleep disturbance and suicidal ideas. The patient is not nervous/anxious.     Objective:  BP 118/74   Pulse 80   Temp 98 F (36.7 C) (Oral)   Resp 20   Wt 119 lb 12 oz (54.3 kg)   SpO2 97%   BMI 20.56 kg/m   BP Readings from Last 3 Encounters:  05/27/16 118/74  03/24/16 120/70  03/04/16 102/78    Wt Readings from Last 3 Encounters:  05/27/16 119 lb 12 oz (54.3 kg)  03/24/16 122 lb (55.3 kg)  03/04/16 122 lb (55.3 kg)    Physical Exam  Constitutional: She appears well-developed. No distress.  HENT:  Head: Normocephalic.  Right Ear: External ear normal.  Left Ear: External ear normal.  Nose: Nose normal.  Mouth/Throat: Oropharynx is clear and moist.  Eyes: Conjunctivae are normal. Pupils are equal, round, and reactive to light. Right eye exhibits no discharge. Left eye exhibits no discharge.  Neck: Normal range of motion. Neck supple. No  JVD present. No tracheal deviation present. No thyromegaly present.  Cardiovascular: Normal rate, regular rhythm and normal heart sounds.   Pulmonary/Chest: No stridor. No respiratory distress. She has no wheezes.  Abdominal: Soft. Bowel sounds are normal. She exhibits no distension and no mass. There is no tenderness. There is no rebound and no guarding.  Musculoskeletal: She exhibits tenderness. She exhibits no edema.  Lymphadenopathy:    She has no cervical adenopathy.  Neurological: She displays normal reflexes. No cranial nerve deficit. She exhibits normal muscle tone. Coordination normal.  Skin:  No rash noted. No erythema.  Psychiatric: She has a normal mood and affect. Her behavior is normal. Judgment and thought content normal.  L shoulder is sensitive 5th toes crossed rash on R 3 mm round vein dilation on finger Rash between toes  Lab Results  Component Value Date   WBC 4.7 01/01/2016   HGB 13.6 01/01/2016   HCT 39.5 01/01/2016   PLT 198.0 01/01/2016   GLUCOSE 87 03/21/2015   CHOL 223 (H) 03/21/2015   TRIG 46 03/21/2015   HDL 76 03/21/2015   LDLDIRECT 134.0 01/25/2012   LDLCALC 138 (H) 03/21/2015   ALT 15 03/21/2015   AST 22 03/21/2015   NA 136 03/21/2015   K 4.5 03/21/2015   CL 102 03/21/2015   CREATININE 0.94 03/21/2015   BUN 20 03/21/2015   CO2 25 03/21/2015   TSH 2.305 03/21/2015   INR 0.97 08/01/2011    Dg Cervical Spine Complete  Result Date: 03/24/2016 CLINICAL DATA:  Left side neck pain, left shoulder pain for 2 days, limited range of motion, no known injury EXAM: CERVICAL SPINE - COMPLETE 4+ VIEW COMPARISON:  12/28/2012 FINDINGS: Five views of the cervical spine submitted. No acute fracture or subluxation. Alignment and vertebral body heights are preserved. Mild anterior spurring lower endplate of C4-C5 and C6 vertebral body. No prevertebral soft tissue swelling. Cervical airway is patent. C1-C2 relationship is unremarkable. IMPRESSION: No acute fracture or subluxation.  Minimal degenerative changes. Electronically Signed   By: Natasha Mead M.D.   On: 03/24/2016 12:26    Assessment & Plan:   There are no diagnoses linked to this encounter. I am having Ms. Elie maintain her vitamin C, MULTIPLE VITAMINS PO, EPINEPHrine, Calcium-Vitamin D-Vitamin K (VIACTIV PO), estrogen-methylTESTOSTERone, olopatadine, fluticasone, fexofenadine, erythromycin, diazepam, meloxicam, omeprazole, and cyclobenzaprine.  No orders of the defined types were placed in this encounter.    Follow-up: No Follow-up on file.  Sonda Primes, MD

## 2016-05-27 NOTE — Assessment & Plan Note (Signed)
Ketoconazole cream Rx 

## 2016-05-27 NOTE — Assessment & Plan Note (Addendum)
We discussed age appropriate health related issues, including available/recomended screening tests and vaccinations. We discussed a need for adhering to healthy diet and exercise. Labs/EKG were reviewed/ordered. All questions were answered. Colon due

## 2016-05-27 NOTE — Assessment & Plan Note (Signed)
In PT - better TRE

## 2016-05-27 NOTE — Patient Instructions (Addendum)
Try TRE - Trauma Release Exercise  You can get a "Stress Less TRE" application   Shingrix

## 2016-05-27 NOTE — Progress Notes (Signed)
Pre visit review using our clinic review tool, if applicable. No additional management support is needed unless otherwise documented below in the visit note. 

## 2016-06-01 ENCOUNTER — Other Ambulatory Visit: Payer: Self-pay | Admitting: Internal Medicine

## 2016-06-01 DIAGNOSIS — R3129 Other microscopic hematuria: Secondary | ICD-10-CM

## 2016-06-04 ENCOUNTER — Telehealth: Payer: Self-pay | Admitting: Internal Medicine

## 2016-06-04 ENCOUNTER — Ambulatory Visit (INDEPENDENT_AMBULATORY_CARE_PROVIDER_SITE_OTHER): Payer: No Typology Code available for payment source | Admitting: Internal Medicine

## 2016-06-04 ENCOUNTER — Encounter: Payer: Self-pay | Admitting: Internal Medicine

## 2016-06-04 VITALS — BP 102/62 | HR 53 | Temp 97.8°F | Resp 16 | Ht 64.0 in | Wt 122.8 lb

## 2016-06-04 DIAGNOSIS — M62838 Other muscle spasm: Secondary | ICD-10-CM | POA: Diagnosis not present

## 2016-06-04 DIAGNOSIS — R3129 Other microscopic hematuria: Secondary | ICD-10-CM | POA: Diagnosis not present

## 2016-06-04 DIAGNOSIS — R3915 Urgency of urination: Secondary | ICD-10-CM | POA: Diagnosis not present

## 2016-06-04 LAB — POCT URINALYSIS DIPSTICK
BILIRUBIN UA: NEGATIVE
Glucose, UA: NEGATIVE
KETONES UA: NEGATIVE
Leukocytes, UA: NEGATIVE
Nitrite, UA: NEGATIVE
PH UA: 6
PROTEIN UA: NEGATIVE
SPEC GRAV UA: 1.025
Urobilinogen, UA: NEGATIVE

## 2016-06-04 MED ORDER — CIPROFLOXACIN HCL 250 MG PO TABS: 250.0000 mg | ORAL_TABLET | Freq: Two times a day (BID) | ORAL | 1 refills | Status: DC

## 2016-06-04 NOTE — Telephone Encounter (Signed)
[facility] forward 5 pages to Dr. Posey ReaPlotnikov

## 2016-06-04 NOTE — Progress Notes (Signed)
Pre-visit discussion using our clinic review tool. No additional management support is needed unless otherwise documented below in the visit note.  

## 2016-06-04 NOTE — Patient Instructions (Addendum)
Try AZO for urinary symptoms Try Cranberry pills daily

## 2016-06-04 NOTE — Assessment & Plan Note (Signed)
Dr Renetta ChalkHorvath Urol ref offered AZO Cipro prn

## 2016-06-04 NOTE — Progress Notes (Signed)
Subjective:  Patient ID: Karen [name], female    DOB: [birth year]  Age: 61 y.o. MRN: [id number]  CC: No chief complaint on file.   HPI Karen RaWendy Lingerfelt presents for urinary urgency and frequency Seeing Dr Henderson CloudHorvath  Outpatient Medications Prior to Visit  Medication Sig Dispense Refill  . Calcium-Vitamin D-Vitamin K (VIACTIV PO) Take 1 tablet by mouth 2 (two) times daily.    . cyclobenzaprine (FLEXERIL) 5 MG tablet take 1 tablet by mouth three times a day if needed for muscle spasm 30 tablet 1  . diazepam (VALIUM) 2 MG tablet Take 1 tablet (2 mg total) by mouth every 8 (eight) hours as needed for anxiety. 60 tablet 1  . EPINEPHrine (EPI-PEN) 0.3 mg/0.3 mL DEVI Inject 0.3 mg into the muscle as needed. Allergic reactions...anaphylaxis    . erythromycin ophthalmic ointment Place 1 application into both eyes at bedtime. 3.5 g 0  . estrogen-methylTESTOSTERone 0.625-1.25 MG per tablet Take 1 tablet by mouth daily. 90 tablet 3  . fexofenadine (ALLEGRA) 180 MG tablet Take 1 tablet (180 mg total) by mouth daily. 100 tablet 2  . fluticasone (FLONASE) 50 MCG/ACT nasal spray Place 2 sprays into both nostrils daily. 16 g 6  . ketoconazole (NIZORAL) 2 % cream Apply 1 application topically daily. 15 g 1  . meloxicam (MOBIC) 15 MG tablet Take 1 tablet (15 mg total) by mouth daily. 30 tablet 1  . olopatadine (PATANOL) 0.1 % ophthalmic solution Place 1 drop into both eyes 2 (two) times daily. 5 mL 1  . omeprazole (PRILOSEC) 20 MG capsule Take 1 capsule (20 mg total) by mouth daily. 14 capsule 0  . vitamin C (ASCORBIC ACID) 500 MG tablet Take 500 mg by mouth daily.       No facility-administered medications prior to visit.     ROS Review of Systems  Constitutional: Negative for activity change, appetite change, chills, fatigue and unexpected weight change.  HENT: Negative for congestion, mouth sores and sinus pressure.   Eyes: Negative for visual disturbance.  Respiratory: Negative for cough and chest  tightness.   Gastrointestinal: Negative for abdominal pain and nausea.  Genitourinary: Positive for frequency and vaginal pain. Negative for difficulty urinating.  Musculoskeletal: Negative for back pain and gait problem.  Skin: Negative for pallor and rash.  Neurological: Negative for dizziness, tremors, weakness, numbness and headaches.  Psychiatric/Behavioral: Negative for confusion and sleep disturbance. The patient is nervous/anxious.     Objective:  There were no vitals taken for this visit.  BP Readings from Last 3 Encounters:  05/27/16 118/74  03/24/16 120/70  03/04/16 102/78    Wt Readings from Last 3 Encounters:  05/27/16 119 lb 12 oz (54.3 kg)  03/24/16 122 lb (55.3 kg)  03/04/16 122 lb (55.3 kg)    Physical Exam  Constitutional: She appears well-developed. No distress.  HENT:  Head: Normocephalic.  Right Ear: External ear normal.  Left Ear: External ear normal.  Nose: Nose normal.  Mouth/Throat: Oropharynx is clear and moist.  Eyes: Conjunctivae are normal. Pupils are equal, round, and reactive to light. Right eye exhibits no discharge. Left eye exhibits no discharge.  Neck: Normal range of motion. Neck supple. No JVD present. No tracheal deviation present. No thyromegaly present.  Cardiovascular: Normal rate, regular rhythm and normal heart sounds.   Pulmonary/Chest: No stridor. No respiratory distress. She has no wheezes.  Abdominal: Soft. Bowel sounds are normal. She exhibits no distension and no mass. There is no tenderness. There is no rebound  and no guarding.  Musculoskeletal: She exhibits no edema or tenderness.  Lymphadenopathy:    She has no cervical adenopathy.  Neurological: She displays normal reflexes. No cranial nerve deficit. She exhibits normal muscle tone. Coordination normal.  Skin: No rash noted. No erythema.  Psychiatric: She has a normal mood and affect. Her behavior is normal. Judgment and thought content normal.   FTF>20 min discussing  abn UA  Lab Results  Component Value Date   WBC 4.7 05/27/2016   HGB 13.9 05/27/2016   HCT 41.4 05/27/2016   PLT 193.0 05/27/2016   GLUCOSE 95 05/27/2016   CHOL 206 (H) 05/27/2016   TRIG 35.0 05/27/2016   HDL 81.90 05/27/2016   LDLDIRECT 134.0 01/25/2012   LDLCALC 117 (H) 05/27/2016   ALT 15 05/27/2016   AST 21 05/27/2016   NA 136 05/27/2016   K 4.0 05/27/2016   CL 102 05/27/2016   CREATININE 0.94 05/27/2016   BUN 15 05/27/2016   CO2 29 05/27/2016   TSH 3.11 05/27/2016   INR 0.97 08/01/2011    Dg Cervical Spine Complete  Result Date: 03/24/2016 CLINICAL DATA:  Left side neck pain, left shoulder pain for 2 days, limited range of motion, no known injury EXAM: CERVICAL SPINE - COMPLETE 4+ VIEW COMPARISON:  12/28/2012 FINDINGS: Five views of the cervical spine submitted. No acute fracture or subluxation. Alignment and vertebral body heights are preserved. Mild anterior spurring lower endplate of C4-C5 and C6 vertebral body. No prevertebral soft tissue swelling. Cervical airway is patent. C1-C2 relationship is unremarkable. IMPRESSION: No acute fracture or subluxation.  Minimal degenerative changes. Electronically Signed   By: Natasha Mead M.D.   On: 03/24/2016 12:26    Assessment & Plan:   There are no diagnoses linked to this encounter. I am having Ms. Renaud maintain her vitamin C, EPINEPHrine, Calcium-Vitamin D-Vitamin K (VIACTIV PO), estrogen-methylTESTOSTERone, olopatadine, fluticasone, fexofenadine, erythromycin, diazepam, meloxicam, omeprazole, cyclobenzaprine, and ketoconazole.  No orders of the defined types were placed in this encounter.    Follow-up: No Follow-up on file.  Sonda Primes, MD

## 2016-06-08 ENCOUNTER — Telehealth: Payer: Self-pay

## 2016-06-08 DIAGNOSIS — R3129 Other microscopic hematuria: Secondary | ICD-10-CM

## 2016-06-09 NOTE — Telephone Encounter (Signed)
Error

## 2016-06-10 ENCOUNTER — Other Ambulatory Visit: Payer: PRIVATE HEALTH INSURANCE

## 2016-06-10 ENCOUNTER — Ambulatory Visit
Admission: RE | Admit: 2016-06-10 | Discharge: 2016-06-10 | Disposition: A | Discharge status: Home / Self Care | Payer: PRIVATE HEALTH INSURANCE | Source: Ambulatory Visit | Attending: Internal Medicine | Admitting: Internal Medicine

## 2016-06-10 DIAGNOSIS — R3129 Other microscopic hematuria: Secondary | ICD-10-CM

## 2016-06-18 ENCOUNTER — Other Ambulatory Visit: Payer: Self-pay | Admitting: Obstetrics and Gynecology

## 2016-06-18 ENCOUNTER — Ambulatory Visit
Admission: RE | Admit: 2016-06-18 | Discharge: 2016-06-18 | Disposition: A | Discharge status: Home / Self Care | Payer: PRIVATE HEALTH INSURANCE | Source: Ambulatory Visit | Attending: Obstetrics and Gynecology | Admitting: Obstetrics and Gynecology

## 2016-06-18 ENCOUNTER — Ambulatory Visit
Admission: RE | Admit: 2016-06-18 | Discharge: 2016-06-18 | Disposition: A | Discharge status: Home / Self Care | Payer: PRIVATE HEALTH INSURANCE | Source: Ambulatory Visit | Attending: Internal Medicine | Admitting: Internal Medicine

## 2016-06-18 DIAGNOSIS — E2839 Other primary ovarian failure: Secondary | ICD-10-CM

## 2016-06-18 DIAGNOSIS — Z1231 Encounter for screening mammogram for malignant neoplasm of breast: Secondary | ICD-10-CM

## 2016-07-02 NOTE — Assessment & Plan Note (Signed)
Better  

## 2016-07-16 ENCOUNTER — Encounter: Payer: Self-pay | Admitting: Family Medicine

## 2016-07-16 ENCOUNTER — Ambulatory Visit (INDEPENDENT_AMBULATORY_CARE_PROVIDER_SITE_OTHER): Payer: No Typology Code available for payment source | Admitting: Family Medicine

## 2016-07-16 VITALS — BP 98/64 | HR 77 | Temp 99.1°F | Wt 122.4 lb

## 2016-07-16 DIAGNOSIS — J029 Acute pharyngitis, unspecified: Secondary | ICD-10-CM | POA: Diagnosis not present

## 2016-07-16 DIAGNOSIS — J0140 Acute pansinusitis, unspecified: Secondary | ICD-10-CM | POA: Diagnosis not present

## 2016-07-16 DIAGNOSIS — R509 Fever, unspecified: Secondary | ICD-10-CM | POA: Diagnosis not present

## 2016-07-16 LAB — POCT RAPID STREP A (OFFICE): Rapid Strep A Screen: NEGATIVE

## 2016-07-16 LAB — POCT INFLUENZA A/B
INFLUENZA A, POC: NEGATIVE
INFLUENZA B, POC: NEGATIVE

## 2016-07-16 MED ORDER — HYDROCODONE-HOMATROPINE 5-1.5 MG/5ML PO SYRP: 5.0000 mL | ORAL_SOLUTION | Freq: Three times a day (TID) | ORAL | 0 refills | Status: DC | PRN

## 2016-07-16 MED ORDER — BENZONATATE 100 MG PO CAPS: 100.0000 mg | ORAL_CAPSULE | Freq: Three times a day (TID) | ORAL | 0 refills | Status: DC

## 2016-07-16 MED ORDER — DOXYCYCLINE HYCLATE 100 MG PO TABS: 100.0000 mg | ORAL_TABLET | Freq: Two times a day (BID) | ORAL | 0 refills | Status: DC

## 2016-07-16 NOTE — Patient Instructions (Addendum)
It was a pleasure to see you today. You may use Mucinex for thinning secretions.. Cough can be treated with benzonatate or hycodan at night if needed.  You can use ibuprofen 400 to 600 mg every 6 hours as needed for discomfort.  Please drink plenty of water enough for your urine to be pale yellow or clear. Follow up for evaluation if your symptoms do not improve in 3-4 days, worsen, or you develop a fever greater than 100.   Sinusitis, Adult Sinusitis is soreness and inflammation of your sinuses. Sinuses are hollow spaces in the bones around your face. They are located:  Around your eyes.  In the middle of your forehead.  Behind your nose.  In your cheekbones. Your sinuses and nasal passages are lined with a stringy fluid (mucus). Mucus normally drains out of your sinuses. When your nasal tissues get inflamed or swollen, the mucus can get trapped or blocked so air cannot flow through your sinuses. This lets bacteria, viruses, and funguses grow, and that leads to infection. Follow these instructions at home: Medicines   Take, use, or apply over-the-counter and prescription medicines only as told by your doctor. These may include nasal sprays.  If you were prescribed an antibiotic medicine, take it as told by your doctor. Do not stop taking the antibiotic even if you start to feel better. Hydrate and Humidify   Drink enough water to keep your pee (urine) clear or pale yellow.  Use a cool mist humidifier to keep the humidity level in your home above 50%.  Breathe in steam for 10-15 minutes, 3-4 times a day or as told by your doctor. You can do this in the bathroom while a hot shower is running.  Try not to spend time in cool or dry air. Rest   Rest as much as possible.  Sleep with your head raised (elevated).  Make sure to get enough sleep each night. General instructions   Put a warm, moist washcloth on your face 3-4 times a day or as told by your doctor. This will help with  discomfort.  Wash your hands often with soap and water. If there is no soap and water, use hand sanitizer.  Do not smoke. Avoid being around people who are smoking (secondhand smoke).  Keep all follow-up visits as told by your doctor. This is important. Contact a doctor if:  You have a fever.  Your symptoms get worse.  Your symptoms do not get better within 10 days. Get help right away if:  You have a very bad headache.  You cannot stop throwing up (vomiting).  You have pain or swelling around your face or eyes.  You have trouble seeing.  You feel confused.  Your neck is stiff.  You have trouble breathing. This information is not intended to replace advice given to you by your health care provider. Make sure you discuss any questions you have with your health care provider. Document Released: 09/23/2007 Document Revised: 12/01/2015 Document Reviewed: 01/30/2015 Elsevier Interactive Patient Education  2017 [employer]  WE NOW OFFER   [facility] FAST TRACK!!!  SAME DAY Appointments for ACUTE CARE  Such as: Sprains, Injuries, cuts, abrasions, rashes, muscle pain, joint pain, back pain Colds, flu, sore throats, headache, allergies, cough, fever  Ear pain, sinus and eye infections Abdominal pain, nausea, vomiting, diarrhea, upset stomach Animal/insect bites  3 Easy Ways to Schedule: Walk-In Scheduling Call in scheduling Mychart Sign-up: [email or web address]

## 2016-07-16 NOTE — Progress Notes (Signed)
Patient ID: Karen [name], female   DOB: 08-26-1955, 61 y.o.   MRN: [id number]  PCP: Sonda Primes, MD  Subjective:  Karen [name] is a 61 y.o. year old very pleasant female patient who presents with Upper Respiratory infection symptoms including nasal congestion, sore throat, cough that is nonproductive, sinus pressure/pain, low grade fever 99.45F and chills. -started: 2 weeks, symptoms are not improving -previous treatments: Bendryl provided limited benefit. -sick contacts/travel/risks: denies flu exposure but reports that co-workers have been sick -Hx of: allergies No recent antibiotic therapy  ROS-denies  SOB, NVD, tooth pain  Pertinent Past Medical History- Allergic rhinitis,  No recent antibiotic therapy  Medications- reviewed  Current Outpatient Prescriptions  Medication Sig Dispense Refill  . Calcium-Vitamin D-Vitamin K (VIACTIV PO) Take 1 tablet by mouth 2 (two) times daily.    . ciprofloxacin (CIPRO) 250 MG tablet Take 1 tablet (250 mg total) by mouth 2 (two) times daily. 6 tablet 1  . cyclobenzaprine (FLEXERIL) 5 MG tablet take 1 tablet by mouth three times a day if needed for muscle spasm 30 tablet 1  . diazepam (VALIUM) 2 MG tablet Take 1 tablet (2 mg total) by mouth every 8 (eight) hours as needed for anxiety. 60 tablet 1  . EPINEPHrine (EPI-PEN) 0.3 mg/0.3 mL DEVI Inject 0.3 mg into the muscle as needed. Allergic reactions...anaphylaxis    . erythromycin ophthalmic ointment Place 1 application into both eyes at bedtime. 3.5 g 0  . estrogen-methylTESTOSTERone 0.625-1.25 MG per tablet Take 1 tablet by mouth daily. 90 tablet 3  . fexofenadine (ALLEGRA) 180 MG tablet Take 1 tablet (180 mg total) by mouth daily. 100 tablet 2  . fluticasone (FLONASE) 50 MCG/ACT nasal spray Place 2 sprays into both nostrils daily. 16 g 6  . ketoconazole (NIZORAL) 2 % cream Apply 1 application topically daily. 15 g 1  . meloxicam (MOBIC) 15 MG tablet Take 1 tablet (15 mg total) by mouth daily. 30 tablet  1  . olopatadine (PATANOL) 0.1 % ophthalmic solution Place 1 drop into both eyes 2 (two) times daily. 5 mL 1  . omeprazole (PRILOSEC) 20 MG capsule Take 1 capsule (20 mg total) by mouth daily. 14 capsule 0  . vitamin C (ASCORBIC ACID) 500 MG tablet Take 500 mg by mouth daily.       No current facility-administered medications for this visit.     Objective: BP 98/64 (BP Location: Left Arm, Patient Position: Sitting, Cuff Size: Normal)   Pulse 77   Temp 99.1 F (37.3 C) (Oral)   Wt 122 lb 6.4 oz (55.5 kg)   SpO2 96%   BMI 21.01 kg/m  Gen: NAD, appears acutely ill HEENT: Turbinates erythematous, TM normal, pharynx mildly erythematous with no tonsilar exudate or edema, positive for sinus tenderness. Itchy/watery eyes, CV: RRR no murmurs rubs or gallops Lungs: CTAB no crackles, wheeze, rhonchi Abdomen: soft/nontender/nondistended/normal bowel sounds. No rebound or guarding.  Ext: no edema Skin: warm, dry, no rash Neuro: grossly normal, moves all extremities  Assessment/Plan: 1. Acute pansinusitis, recurrence not specified Duration of symptoms and presentation support treatment for sinusitis. Advised patient on supportive measures:  Get rest, drink plenty of fluids, and use tylenol or ibuprofen as needed for pain. Follow up if fever >100, if symptoms worsen or if symptoms are not improved in 3 to 4 days. Patient verbalizes understanding.   2. Sore throat Rapid strep negative - POCT rapid strep A  3. Fever and chills Influenza test is negative - POC Influenza A/B  Finally, we reviewed reasons to return to care including if symptoms worsen or persist or new concerns arise- once again particularly shortness of breath or fever.   Inez CatalinaJulia Ann Kenn Rekowski, FNP

## 2016-07-16 NOTE — Progress Notes (Signed)
Pre visit review using our clinic review tool, if applicable. No additional management support is needed unless otherwise documented below in the visit note. 

## 2016-07-30 ENCOUNTER — Other Ambulatory Visit: Payer: Self-pay | Admitting: Family Medicine

## 2016-07-30 ENCOUNTER — Other Ambulatory Visit: Payer: Self-pay | Admitting: Internal Medicine

## 2016-07-31 ENCOUNTER — Ambulatory Visit (INDEPENDENT_AMBULATORY_CARE_PROVIDER_SITE_OTHER)
Admission: RE | Admit: 2016-07-31 | Discharge: 2016-07-31 | Disposition: A | Discharge status: Home / Self Care | Payer: No Typology Code available for payment source | Source: Ambulatory Visit | Attending: Internal Medicine | Admitting: Internal Medicine

## 2016-07-31 ENCOUNTER — Encounter: Payer: Self-pay | Admitting: Internal Medicine

## 2016-07-31 ENCOUNTER — Ambulatory Visit (INDEPENDENT_AMBULATORY_CARE_PROVIDER_SITE_OTHER): Payer: No Typology Code available for payment source | Admitting: Internal Medicine

## 2016-07-31 VITALS — BP 110/72 | HR 68 | Temp 98.1°F | Ht 64.0 in | Wt 121.1 lb

## 2016-07-31 DIAGNOSIS — R05 Cough: Secondary | ICD-10-CM

## 2016-07-31 DIAGNOSIS — J45909 Unspecified asthma, uncomplicated: Secondary | ICD-10-CM | POA: Insufficient documentation

## 2016-07-31 DIAGNOSIS — J209 Acute bronchitis, unspecified: Secondary | ICD-10-CM

## 2016-07-31 DIAGNOSIS — R059 Cough, unspecified: Secondary | ICD-10-CM

## 2016-07-31 MED ORDER — BENZONATATE 200 MG PO CAPS: 200.0000 mg | ORAL_CAPSULE | Freq: Three times a day (TID) | ORAL | 0 refills | Status: DC | PRN

## 2016-07-31 MED ORDER — HYDROCODONE-HOMATROPINE 5-1.5 MG/5ML PO SYRP: 5.0000 mL | ORAL_SOLUTION | Freq: Four times a day (QID) | ORAL | 0 refills | Status: DC | PRN

## 2016-07-31 NOTE — Assessment & Plan Note (Signed)
CXR Tessalon Cough syrup

## 2016-07-31 NOTE — Patient Instructions (Signed)
You can use over-the-counter  "cold" medicines  such as "Tylenol cold" , "Advil cold",  "Mucinex" or" Mucinex D"  for cough and congestion.   Avoid decongestants if you have high blood pressure and use "Afrin" nasal spray for nasal congestion as directed. Use " Delsym" or" Robitussin" cough syrup varietis for cough.  You can use plain "Tylenol" or "Advil" for fever, chills and achyness. Use Halls or Ricola cough drops.   "Common cold" symptoms are usually triggered by a virus.  The antibiotics are usually not necessary. On average, a" viral cold" illness would take 4-7 days to resolve.   Please, make an appointment if you are not better or if you're worse.  

## 2016-07-31 NOTE — Progress Notes (Signed)
Subjective:  Patient ID: Karen Karen [name], female    DOB: 1955-10-01  Age: 61 y.o. MRN: [id number]  CC: No chief complaint on file.   HPI Karen Karen [name] presents for URI x 2 weeks - worse now. Was seen on 3/29 - given Doxy. Was better - worse now - severe cough. It is dry. A little SOB.    Outpatient Medications Prior to Visit  Medication Sig Dispense Refill  . benzonatate (TESSALON) 100 MG capsule take 1 capsule by mouth three times a day 20 capsule 0  . Calcium-Vitamin D-Vitamin K (VIACTIV PO) Take 1 tablet by mouth 2 (two) times daily.    . ciprofloxacin (CIPRO) 250 MG tablet Take 1 tablet (250 mg total) by mouth 2 (two) times daily. 6 tablet 1  . cyclobenzaprine (FLEXERIL) 5 MG tablet take 1 tablet by mouth three times a day if needed for muscle spasm 30 tablet 1  . diazepam (VALIUM) 2 MG tablet Take 1 tablet (2 mg total) by mouth every 8 (eight) hours as needed for anxiety. 60 tablet 1  . doxycycline (VIBRA-TABS) 100 MG tablet Take 1 tablet (100 mg total) by mouth 2 (two) times daily. 20 tablet 0  . EPINEPHrine (EPI-PEN) 0.3 mg/0.3 mL DEVI Inject 0.3 mg into the muscle as needed. Allergic reactions...anaphylaxis    . erythromycin ophthalmic ointment Place 1 application into both eyes at bedtime. 3.5 g 0  . estrogen-methylTESTOSTERone 0.625-1.25 MG per tablet Take 1 tablet by mouth daily. 90 tablet 3  . fexofenadine (ALLEGRA) 180 MG tablet Take 1 tablet (180 mg total) by mouth daily. 100 tablet 2  . fluticasone (FLONASE) 50 MCG/ACT nasal spray Place 2 sprays into both nostrils daily. 16 g 6  . HYDROcodone-homatropine (HYCODAN) 5-1.5 MG/5ML syrup Take 5 mLs by mouth every 8 (eight) hours as needed for cough. 120 mL 0  . ketoconazole (NIZORAL) 2 % cream Apply 1 application topically daily. 15 g 1  . meloxicam (MOBIC) 15 MG tablet Take 1 tablet (15 mg total) by mouth daily. 30 tablet 1  . olopatadine (PATANOL) 0.1 % ophthalmic solution Place 1 drop into both eyes 2 (two) times daily. 5 mL 1  .  omeprazole (PRILOSEC) 20 MG capsule Take 1 capsule (20 mg total) by mouth daily. 14 capsule 0  . vitamin C (ASCORBIC ACID) 500 MG tablet Take 500 mg by mouth daily.       No facility-administered medications prior to visit.     ROS Review of Systems  Constitutional: Negative for activity change, appetite change, chills, fatigue and unexpected weight change.  HENT: Negative for congestion, mouth sores and sinus pressure.   Eyes: Negative for visual disturbance.  Respiratory: Positive for cough and shortness of breath. Negative for chest tightness.   Gastrointestinal: Negative for abdominal pain and nausea.  Genitourinary: Negative for difficulty urinating, frequency and vaginal pain.  Musculoskeletal: Negative for back pain and gait problem.  Skin: Negative for pallor and rash.  Neurological: Negative for dizziness, tremors, weakness, numbness and headaches.  Psychiatric/Behavioral: Negative for confusion and sleep disturbance.    Objective:  BP 110/72 (BP Location: Left Arm, Patient Position: Sitting, Cuff Size: Normal)   Pulse 68   Temp 98.1 F (36.7 C) (Oral)   Ht  (1.626 m)   Wt 121 lb 1.3 oz (54.9 kg)   SpO2 99%   BMI 20.78 kg/m   BP Readings from Last 3 Encounters:  07/31/16 110/72  07/16/16 98/64  06/04/16 102/62    Wt Readings  from Last 3 Encounters:  07/31/16 121 lb 1.3 oz (54.9 kg)  07/16/16 122 lb 6.4 oz (55.5 kg)  06/04/16 122 lb 12 oz (55.7 kg)    Physical Exam  Constitutional: She appears well-developed. No distress.  HENT:  Head: Normocephalic.  Right Ear: External ear normal.  Left Ear: External ear normal.  Nose: Nose normal.  Mouth/Throat: Oropharynx is clear and moist.  Eyes: Conjunctivae are normal. Pupils are equal, round, and reactive to light. Right eye exhibits no discharge. Left eye exhibits no discharge.  Neck: Normal range of motion. Neck supple. No JVD present. No tracheal deviation present. No thyromegaly present.  Cardiovascular:  Normal rate, regular rhythm and normal heart sounds.   Pulmonary/Chest: No stridor. No respiratory distress. She has no wheezes. She has no rales. She exhibits no tenderness.  Abdominal: Soft. Bowel sounds are normal. She exhibits no distension and no mass. There is no tenderness. There is no rebound and no guarding.  Musculoskeletal: She exhibits no edema or tenderness.  Lymphadenopathy:    She has no cervical adenopathy.  Neurological: She displays normal reflexes. No cranial nerve deficit. She exhibits normal muscle tone. Coordination normal.  Skin: No rash noted. No erythema.  Psychiatric: She has a normal mood and affect. Her behavior is normal. Judgment and thought content normal.  coughing a lot  Lab Results  Component Value Date   WBC 4.7 05/27/2016   HGB 13.9 05/27/2016   HCT 41.4 05/27/2016   PLT 193.0 05/27/2016   GLUCOSE 95 05/27/2016   CHOL 206 (H) 05/27/2016   TRIG 35.0 05/27/2016   HDL 81.90 05/27/2016   LDLDIRECT 134.0 01/25/2012   LDLCALC 117 (H) 05/27/2016   ALT 15 05/27/2016   AST 21 05/27/2016   NA 136 05/27/2016   K 4.0 05/27/2016   CL 102 05/27/2016   CREATININE 0.94 05/27/2016   BUN 15 05/27/2016   CO2 29 05/27/2016   TSH 3.11 05/27/2016   INR 0.97 08/01/2011    Dg Bone Density  Result Date: 06/18/2016 EXAM: DUAL X-RAY ABSORPTIOMETRY (DXA) FOR BONE MINERAL DENSITY IMPRESSION: Referring Physician:  Carrington Clamp PATIENT: Name: Karen, Karen [name] Patient ID: [id number] Birth Date: Jul 15, 1955 Height: 64.5 in. Sex: Female Measured: 06/18/2016 Weight: 122.7 lbs. Indications: Bilateral Ovariectomy (65.51), Caucasian, Family Hist. (Parent hip fracture), Hysterectomy Fractures: None Treatments: Calcium (E943.0), ERT (V07.4), Vitamin D (E933.5) ASSESSMENT: The BMD measured at Femur Neck Left is 0.901 g/cm2 with a T-score of -1.0. This patient is considered normal according to World Health Organization [facility]) criteria. L-3, L-4 were excluded due to degenerative changes.  Site Region Measured Date Measured Age YA BMD Significant CHANGE T-score DualFemur Neck Left 06/18/2016    61.6         -1.0    0.901 g/cm2 AP Spine  L1-L2     06/18/2016    61.6         -0.5    1.111 g/cm2 World Health Organization [facility]) criteria for post-menopausal, Caucasian Women: Normal       T-score at or above -1 SD Osteopenia   T-score between -1 and -2.5 SD Osteoporosis T-score at or below -2.5 SD RECOMMENDATION: National Osteoporosis Foundation recommends that FDA-approved medical therapies be considered in postmenopausal women and men age 28 or older with a: 1. Hip or vertebral (clinical or morphometric) fracture. 2. T-score of <-2.5 at the spine or hip. 3. Ten-year fracture probability by FRAX of 3% or greater for hip fracture or 20% or greater for major osteoporotic fracture. All  treatment decisions require clinical judgment and consideration of individual patient factors, including patient preferences, co-morbidities, previous drug use, risk factors not captured in the FRAX model (e.g. falls, vitamin D deficiency, increased bone turnover, interval significant decline in bone density) and possible under - or over-estimation of fracture risk by FRAX. All patients should ensure an adequate intake of dietary calcium (1200 mg/d) and vitamin D (800 IU daily) unless contraindicated. FOLLOW-UP: People with diagnosed cases of osteoporosis or at high risk for fracture should have regular bone mineral density tests. For patients eligible for Medicare, routine testing is allowed once every 2 years. The testing frequency can be increased to one year for patients who have rapidly progressing disease, those who are receiving or discontinuing medical therapy to restore bone mass, or have additional risk factors. I have reviewed this report, and agree with the above findings. [facility] Electronically Signed   By: Natasha Mead M.D.   On: 06/18/2016 09:02   Mm Digital Screening Bilateral  Result Date:  06/18/2016 CLINICAL DATA:  Screening. EXAM: DIGITAL SCREENING BILATERAL MAMMOGRAM WITH CAD COMPARISON:  Previous exam(s). ACR Breast Density Category b: There are scattered areas of fibroglandular density. FINDINGS: There are no findings suspicious for malignancy. Images were processed with CAD. IMPRESSION: No mammographic evidence of malignancy. A result letter of this screening mammogram will be mailed directly to the patient. RECOMMENDATION: Screening mammogram in one year. (Code:SM-B-01Y) BI-RADS CATEGORY  1: Negative. Electronically Signed   By: Sherian Rein M.D.   On: 06/18/2016 11:02    Assessment & Plan:   There are no diagnoses linked to this encounter. I am having Ms. Fichter maintain her vitamin C, EPINEPHrine, Calcium-Vitamin D-Vitamin K (VIACTIV PO), estrogen-methylTESTOSTERone, olopatadine, fluticasone, fexofenadine, erythromycin, diazepam, meloxicam, omeprazole, cyclobenzaprine, ketoconazole, ciprofloxacin, doxycycline, HYDROcodone-homatropine, and benzonatate.  No orders of the defined types were placed in this encounter.    Follow-up: No Follow-up on file.  Sonda Primes, MD

## 2016-07-31 NOTE — Progress Notes (Signed)
Pre visit review using our clinic review tool, if applicable. No additional management support is needed unless otherwise documented below in the visit note. 

## 2016-09-10 ENCOUNTER — Encounter: Payer: Self-pay | Admitting: Internal Medicine

## 2016-09-10 ENCOUNTER — Ambulatory Visit (INDEPENDENT_AMBULATORY_CARE_PROVIDER_SITE_OTHER): Payer: No Typology Code available for payment source | Admitting: Internal Medicine

## 2016-09-10 VITALS — BP 108/68 | HR 59 | Temp 97.7°F | Ht 64.0 in | Wt 121.0 lb

## 2016-09-10 DIAGNOSIS — R05 Cough: Secondary | ICD-10-CM | POA: Diagnosis not present

## 2016-09-10 DIAGNOSIS — J4521 Mild intermittent asthma with (acute) exacerbation: Secondary | ICD-10-CM

## 2016-09-10 DIAGNOSIS — R059 Cough, unspecified: Secondary | ICD-10-CM

## 2016-09-10 DIAGNOSIS — J301 Allergic rhinitis due to pollen: Secondary | ICD-10-CM | POA: Diagnosis not present

## 2016-09-10 MED ORDER — MONTELUKAST SODIUM 10 MG PO TABS: 10.0000 mg | ORAL_TABLET | Freq: Every day | ORAL | 3 refills | Status: DC

## 2016-09-10 MED ORDER — PROMETHAZINE-CODEINE 6.25-10 MG/5ML PO SYRP: 5.0000 mL | ORAL_SOLUTION | ORAL | 0 refills | Status: DC | PRN

## 2016-09-10 MED ORDER — AZITHROMYCIN 250 MG PO TABS: ORAL_TABLET | ORAL | 0 refills | Status: DC

## 2016-09-10 NOTE — Assessment & Plan Note (Addendum)
Recurrent cough. ?VCD vs other Prom-cod syr Singulair Pulm ref Zpac Declined Depo-shot  5/18 cough x2 months, coughing up green 2 wks ago. Worse w/talking and in the daytime. No cough at night. Saw Dr Pollyann Kennedyosen last fall for hoarseness - OK.

## 2016-09-10 NOTE — Progress Notes (Signed)
Subjective:  Patient ID: Karen [name], female    DOB: 1955-06-23  Age: 61 y.o. MRN: [id number]  CC: No chief complaint on file.   HPI Karen RaWendy Monahan presents for cough x2 months, coughing up green 2 wks ago. Worse w/talking and in the daytime. No cough at night. Saw Dr Pollyann Kennedyosen last fall for hoarseness - OK. C/o post-nasal drip  Outpatient Medications Prior to Visit  Medication Sig Dispense Refill  . benzonatate (TESSALON) 200 MG capsule Take 1 capsule (200 mg total) by mouth 3 (three) times daily as needed for cough. 60 capsule 0  . Calcium-Vitamin D-Vitamin K (VIACTIV PO) Take 1 tablet by mouth 2 (two) times daily.    . ciprofloxacin (CIPRO) 250 MG tablet Take 1 tablet (250 mg total) by mouth 2 (two) times daily. 6 tablet 1  . cyclobenzaprine (FLEXERIL) 5 MG tablet take 1 tablet by mouth three times a day if needed for muscle spasm 30 tablet 1  . diazepam (VALIUM) 2 MG tablet Take 1 tablet (2 mg total) by mouth every 8 (eight) hours as needed for anxiety. 60 tablet 1  . EPINEPHrine (EPI-PEN) 0.3 mg/0.3 mL DEVI Inject 0.3 mg into the muscle as needed. Allergic reactions...anaphylaxis    . erythromycin ophthalmic ointment Place 1 application into both eyes at bedtime. 3.5 g 0  . estrogen-methylTESTOSTERone 0.625-1.25 MG per tablet Take 1 tablet by mouth daily. 90 tablet 3  . fexofenadine (ALLEGRA) 180 MG tablet Take 1 tablet (180 mg total) by mouth daily. 100 tablet 2  . fluticasone (FLONASE) 50 MCG/ACT nasal spray Place 2 sprays into both nostrils daily. 16 g 6  . HYDROcodone-homatropine (HYCODAN) 5-1.5 MG/5ML syrup Take 5 mLs by mouth every 6 (six) hours as needed for cough. 240 mL 0  . ketoconazole (NIZORAL) 2 % cream Apply 1 application topically daily. 15 g 1  . meloxicam (MOBIC) 15 MG tablet Take 1 tablet (15 mg total) by mouth daily. 30 tablet 1  . olopatadine (PATANOL) 0.1 % ophthalmic solution Place 1 drop into both eyes 2 (two) times daily. 5 mL 1  . omeprazole (PRILOSEC) 20 MG capsule  Take 1 capsule (20 mg total) by mouth daily. 14 capsule 0  . vitamin C (ASCORBIC ACID) 500 MG tablet Take 500 mg by mouth daily.       No facility-administered medications prior to visit.     ROS Review of Systems  Constitutional: Negative for activity change, appetite change, chills, fatigue and unexpected weight change.  HENT: Positive for congestion, postnasal drip and rhinorrhea. Negative for mouth sores, sinus pressure, sneezing, sore throat and voice change.   Eyes: Negative for visual disturbance.  Respiratory: Positive for cough. Negative for chest tightness and shortness of breath.   Gastrointestinal: Negative for abdominal pain and nausea.  Genitourinary: Negative for difficulty urinating, frequency and vaginal pain.  Musculoskeletal: Negative for back pain and gait problem.  Skin: Negative for pallor and rash.  Neurological: Negative for dizziness, tremors, weakness, numbness and headaches.  Psychiatric/Behavioral: Negative for confusion and sleep disturbance.    Objective:  BP 108/68 (BP Location: Left Arm, Patient Position: Sitting, Cuff Size: Normal)   Pulse (!) 59   Temp 97.7 F (36.5 C) (Oral)   Ht 5\' 4"  (1.626 m)   Wt 121 lb (54.9 kg)   SpO2 99%   BMI 20.77 kg/m   BP Readings from Last 3 Encounters:  09/10/16 108/68  07/31/16 110/72  07/16/16 98/64    Wt Readings from Last 3 Encounters:  09/10/16 121 lb (54.9 kg)  07/31/16 121 lb 1.3 oz (54.9 kg)  07/16/16 122 lb 6.4 oz (55.5 kg)    Physical Exam  Constitutional: She appears well-developed. No distress.  HENT:  Head: Normocephalic.  Right Ear: External ear normal.  Left Ear: External ear normal.  Nose: Nose normal.  Mouth/Throat: Oropharynx is clear and moist.  Eyes: Conjunctivae are normal. Pupils are equal, round, and reactive to light. Right eye exhibits no discharge. Left eye exhibits no discharge.  Neck: Normal range of motion. Neck supple. No JVD present. No tracheal deviation present. No  thyromegaly present.  Cardiovascular: Normal rate, regular rhythm and normal heart sounds.   Pulmonary/Chest: No stridor. No respiratory distress. She has no wheezes.  Abdominal: Soft. Bowel sounds are normal. She exhibits no distension and no mass. There is no tenderness. There is no rebound and no guarding.  Musculoskeletal: She exhibits no edema or tenderness.  Lymphadenopathy:    She has no cervical adenopathy.  Neurological: She displays normal reflexes. No cranial nerve deficit. She exhibits normal muscle tone. Coordination normal.  Skin: No rash noted. No erythema.  Psychiatric: She has a normal mood and affect. Her behavior is normal. Judgment and thought content normal.    Lab Results  Component Value Date   WBC 4.7 05/27/2016   HGB 13.9 05/27/2016   HCT 41.4 05/27/2016   PLT 193.0 05/27/2016   GLUCOSE 95 05/27/2016   CHOL 206 (H) 05/27/2016   TRIG 35.0 05/27/2016   HDL 81.90 05/27/2016   LDLDIRECT 134.0 01/25/2012   LDLCALC 117 (H) 05/27/2016   ALT 15 05/27/2016   AST 21 05/27/2016   NA 136 05/27/2016   K 4.0 05/27/2016   CL 102 05/27/2016   CREATININE 0.94 05/27/2016   BUN 15 05/27/2016   CO2 29 05/27/2016   TSH 3.11 05/27/2016   INR 0.97 08/01/2011    Dg Chest 2 View  Result Date: 07/31/2016 CLINICAL DATA:  Cough for 1 week, patient had flu and sinus infection, was on antibiotics for 10 days EXAM: CHEST  2 VIEW COMPARISON:  01/01/2016 FINDINGS: Cardiomediastinal silhouette is unremarkable. No infiltrate or pleural effusion. No pulmonary edema. Bony thorax is unremarkable. IMPRESSION: No active cardiopulmonary disease. Electronically Signed   By: Natasha Mead M.D.   On: 07/31/2016 15:22    Assessment & Plan:   There are no diagnoses linked to this encounter. I am having Ms. Cooperman maintain her vitamin C, EPINEPHrine, Calcium-Vitamin D-Vitamin K (VIACTIV PO), estrogen-methylTESTOSTERone, olopatadine, fluticasone, fexofenadine, erythromycin, diazepam, meloxicam,  omeprazole, cyclobenzaprine, ketoconazole, ciprofloxacin, HYDROcodone-homatropine, and benzonatate.  No orders of the defined types were placed in this encounter.    Follow-up: No Follow-up on file.  Sonda Primes, MD

## 2016-09-10 NOTE — Assessment & Plan Note (Signed)
Added Singulair 

## 2016-09-14 ENCOUNTER — Encounter: Payer: Self-pay | Admitting: Internal Medicine

## 2016-09-16 ENCOUNTER — Other Ambulatory Visit: Payer: Self-pay | Admitting: Internal Medicine

## 2016-10-16 ENCOUNTER — Encounter: Payer: Self-pay | Admitting: Internal Medicine

## 2016-10-16 ENCOUNTER — Other Ambulatory Visit (INDEPENDENT_AMBULATORY_CARE_PROVIDER_SITE_OTHER): Payer: No Typology Code available for payment source

## 2016-10-16 ENCOUNTER — Ambulatory Visit (INDEPENDENT_AMBULATORY_CARE_PROVIDER_SITE_OTHER): Payer: No Typology Code available for payment source | Admitting: Internal Medicine

## 2016-10-16 VITALS — BP 104/64 | HR 60 | Ht 64.5 in | Wt 122.0 lb

## 2016-10-16 DIAGNOSIS — R05 Cough: Secondary | ICD-10-CM | POA: Insufficient documentation

## 2016-10-16 DIAGNOSIS — R058 Other specified cough: Secondary | ICD-10-CM

## 2016-10-16 DIAGNOSIS — J302 Other seasonal allergic rhinitis: Secondary | ICD-10-CM

## 2016-10-16 LAB — CBC WITH DIFFERENTIAL/PLATELET
BASOS PCT: 1 % (ref 0.0–3.0)
Basophils Absolute: 0.1 10*3/uL (ref 0.0–0.1)
EOS ABS: 0.1 10*3/uL (ref 0.0–0.7)
EOS PCT: 1.6 % (ref 0.0–5.0)
HCT: 39.4 % (ref 36.0–46.0)
HEMOGLOBIN: 13.2 g/dL (ref 12.0–15.0)
LYMPHS ABS: 2.1 10*3/uL (ref 0.7–4.0)
Lymphocytes Relative: 41.2 % (ref 12.0–46.0)
MCHC: 33.4 g/dL (ref 30.0–36.0)
MCV: 93.2 fl (ref 78.0–100.0)
MONO ABS: 0.4 10*3/uL (ref 0.1–1.0)
Monocytes Relative: 8.7 % (ref 3.0–12.0)
NEUTROS ABS: 2.4 10*3/uL (ref 1.4–7.7)
NEUTROS PCT: 47.5 % (ref 43.0–77.0)
PLATELETS: 188 10*3/uL (ref 150.0–400.0)
RBC: 4.23 Mil/uL (ref 3.87–5.11)
RDW: 13.7 % (ref 11.5–15.5)
WBC: 5 10*3/uL (ref 4.0–10.5)

## 2016-10-16 NOTE — Progress Notes (Signed)
Subjective:     Patient ID: Karen [name], female   DOB: 1956/02/16,   MRN: [id number]  HPI   3 yowf never smoker works for [employer] with variable exposure to sick kids and tendency to over use of voice and pattern of recurrent severe cough referred to pulmonary clinic 10/16/2016 by Dr  Posey Rea.   10/16/2016 1st [facility] office visit/ Brance Dartt   Chief Complaint  Patient presents with  . Pulmonary Consult    Referred by Dr Posey Rea for cough. Pt states that her cough has already resolved.   pattern of hoarsness x decades dx as laryngitis lasts typically a week or two with or without rx > neg eval by Pollyann Kennedy 06/01/16 bad cough started in march 2018  lasted 2-3 months and no better p doxy then better p zpak >  Takes allegra daily starting in March  thru July since 2012 and  Never took flonase prev rec and on med list   Not limited by breathing from desired activities    No other  obvious triggers in symptoms or day to day or daytime variability or assoc excess/ purulent sputum or mucus plugs or hemoptysis or cp or chest tightness, subjective wheeze or overt sinus or hb symptoms. No unusual exp hx or h/o childhood pna/ asthma or knowledge of premature birth.  Sleeping ok without nocturnal  or early am exacerbation  of respiratory  c/o's or need for noct saba. Also denies any obvious fluctuation of symptoms with weather or environmental changes or other aggravating or alleviating factors except as outlined above   Current Medications, Allergies, Complete Past Medical History, Past Surgical History, Family History, and Social History were reviewed in [employer] record.  ROS  The following are not active complaints unless bolded sore throat, dysphagia, dental problems, itching, sneezing,  nasal congestion or excess/ purulent secretions, ear ache,   fever, chills, sweats, unintended wt loss, classically pleuritic or exertional cp,  orthopnea pnd or leg swelling,  presyncope, palpitations, abdominal pain, anorexia, nausea, vomiting, diarrhea  or change in bowel or bladder habits, change in stools or urine, dysuria,hematuria,  rash, arthralgias, visual complaints, headache, numbness, weakness or ataxia or problems with walking or coordination,  change in mood/affect or memory.              Review of Systems     Objective:   Physical Exam    amb pleasant wf nad  Wt Readings from Last 3 Encounters:  10/16/16 122 lb (55.3 kg)  09/10/16 121 lb (54.9 kg)  07/31/16 121 lb 1.3 oz (54.9 kg)    Vital signs reviewed   HEENT: nl dentition, turbinates bilaterally, and oropharynx. Nl external ear canals without cough reflex   NECK :  without JVD/Nodes/TM/ nl carotid upstrokes bilaterally   LUNGS: no acc muscle use,  Nl contour chest which is clear to A and P bilaterally without cough on insp or exp maneuvers   CV:  RRR  no s3 or murmur or increase in P2, and no edema   ABD:  soft and nontender with nl inspiratory excursion in the supine position. No bruits or organomegaly appreciated, bowel sounds nl  MS:  Nl gait/ ext warm without deformities, calf tenderness, cyanosis or clubbing No obvious joint restrictions   SKIN: warm and dry without lesions    NEURO:  alert, approp, nl sensorium with  no motor or cerebellar deficits apparent.      I personally reviewed images and agree with radiology  impression as follows:  CXR:   07/31/16 No active cardiopulmonary disease.   Labs ordered 10/16/2016   Allergy profile    Assessment:

## 2016-10-16 NOTE — Patient Instructions (Addendum)
At onset of cough > Try prilosec otc 20mg   Take 30-60 min before first meal of the day and Pepcid ac (famotidine) 20 mg one @  bedtime until cough is completely gone for at least a week without the need for cough suppression (mucinex dm up to 1200 mg every 12 hours as needed  For nasal congestion  >  advil cold and sinus over the counter  GERD (REFLUX)  is an extremely common cause of respiratory symptoms just like yours , many times with no obvious heartburn at all.    It can be treated with medication, but also with lifestyle changes including elevation of the head of your bed (ideally with 6 inch  bed blocks),  Smoking cessation, avoidance of late meals, excessive alcohol, and avoid fatty foods, chocolate, peppermint, colas, red wine, and acidic juices such as orange juice.  NO MINT OR MENTHOL PRODUCTS SO NO COUGH DROPS   USE SUGARLESS CANDY INSTEAD (Jolley ranchers or Stover's or Life Savers) or even ice chips will also do - the key is to swallow to prevent all throat clearing. NO OIL BASED VITAMINS - use powdered substitutes.   Please remember to go to the  x-ray department downstairs in the basement  for your tests - we will call you with the results when they are available.  Pulmomary follow up as needed   A copy of this visit will be sent to your PCP with my recommendations.

## 2016-10-17 NOTE — Assessment & Plan Note (Addendum)
06/2015 seasonal symptoms reported  Variably on Allegra, Flonase, Patanol Benadryl, Afrin Added Singulair - Allergy profile 10/16/2016 >  Eos 0.1 /  IgE    The only med she really takes consistently is allegra which is not really effective with pnds/cough syndromes / has not tried flonase and not clear she gave singulair much of a chance.   Will await allergy testing and rec consider use of 1st gen h1 or zyrtec with next flare of rhinitis or refer to allergy depending on results.

## 2016-10-17 NOTE — Assessment & Plan Note (Addendum)
Pattern is most likely not asthma or any pulmonary problem but strongly favor  Upper airway cough syndrome (previously labeled PNDS) , is  so named because it's frequently impossible to sort out how much is  CR/sinusitis with freq throat clearing (which can be related to primary GERD)   vs  causing  secondary (" extra esophageal")  GERD from wide swings in gastric pressure that occur with throat clearing, often  promoting self use of mint and menthol lozenges that reduce the lower esophageal sphincter tone and exacerbate the problem further in a cyclical fashion.   These are the same pts (now being labeled as having "irritable larynx syndrome" by some cough centers) who not infrequently have a history of having failed to tolerate ace inhibitors,  dry powder inhalers or biphosphonates or report having atypical/extraesophageal reflux symptoms that don't respond to standard doses of PPI  and are easily confused as having aecopd or asthma flares by even experienced allergists/ pulmonologists (myself included).   Of the three most common causes of  Sub-acute or recurrent or chronic cough, only one (GERD)  can actually contribute to/ trigger  the other two (asthma and post nasal drip syndrome)  and perpetuate the cylce of cough.  While not intuitively obvious, many patients with chronic low grade reflux do not cough until there is a primary insult that disturbs the protective epithelial barrier and exposes sensitive nerve endings.   This is typically viral (as likely the case here) but can be direct physical injury such as with an endotracheal tube.   The point is that once this occurs, it is difficult to eliminate the cycle  using anything but a maximally effective acid suppression regimen at least in the short run, accompanied by an appropriate diet to address non acid GERD and control / eliminate the cough itself for at least 3 days.   Advised re next flare of either hoareness/ sore throat or cough to implement  action plan outlined in avs and if not better w/in  A week or two > ov   Total time devoted to counseling  > 50 % of initial 60 min office visit:  review case with pt/ discussion of options/alternatives/ personally creating written customized instructions  in presence of pt  then going over those specific  Instructions directly with the pt including how to use all of the meds but in particular covering each new medication in detail and the difference between the maintenance= "automatic" meds and the prns using an action plan format for the latter (If this problem/symptom => do that organization reading Left to right).  Please see AVS from this visit for a full list of these instructions which I personally wrote for this pt and  are unique to this visit.

## 2016-10-19 ENCOUNTER — Other Ambulatory Visit: Payer: Self-pay | Admitting: Internal Medicine

## 2016-10-19 LAB — RESPIRATORY ALLERGY PROFILE REGION II ~~LOC~~
ALLERGEN, COMM SILVER BIRCH, T3: 1.29 kU/L — AB
ALLERGEN, COTTONWOOD, T14: 0.4 kU/L — AB
ALLERGEN, OAK, T7: 0.4 kU/L — AB
Allergen, A. alternata, m6: <0.1 kU/L
Allergen, C. Herbarum, M2: <0.1 kU/L
Allergen, Cedar tree, t12: 0.13 kU/L — ABNORMAL HIGH
Allergen, D pternoyssinus,d7: 0.18 kU/L — ABNORMAL HIGH
Allergen, Mouse Urine Protein, e78: <0.1 kU/L
Allergen, Mulberry, t76: <0.1 kU/L
Aspergillus fumigatus, m3: <0.1 kU/L
BERMUDA GRASS: 0.67 kU/L — AB
Box Elder IgE: 0.49 kU/L — ABNORMAL HIGH
COMMON RAGWEED: 0.66 kU/L — AB
Cat Dander: 0.24 kU/L — ABNORMAL HIGH
Cockroach: <0.1 kU/L
D. farinae: 0.21 kU/L — ABNORMAL HIGH
Dog Dander: <0.1 kU/L
ELM IGE: 0.47 kU/L — AB
IGE (IMMUNOGLOBULIN E), SERUM: 34 kU/L (ref <115)
Johnson Grass: 0.15 kU/L — ABNORMAL HIGH
PECAN/HICKORY TREE IGE: 0.76 kU/L — AB
Rough Pigweed  IgE: 0.21 kU/L — ABNORMAL HIGH
Sheep Sorrel IgE: 0.63 kU/L — ABNORMAL HIGH
Timothy Grass: 0.57 kU/L — ABNORMAL HIGH

## 2016-10-20 NOTE — Progress Notes (Signed)
Spoke with pt and notified of results per Dr. Wert. Pt verbalized understanding and denied any questions. 

## 2016-12-08 ENCOUNTER — Telehealth: Payer: Self-pay | Admitting: Internal Medicine

## 2016-12-08 NOTE — Telephone Encounter (Signed)
Pt called stating she has thrown out her back and would like a refill of her meloxicam (MOBIC) 15 MG tablet  I told her the last time this was prescribed was 09/17 And you may need an appt to come in.  She said she cannot move or drive and her husband is out of town until Friday. She does have a few left.  Please advise and call back  [facility] on [address]

## 2016-12-09 ENCOUNTER — Ambulatory Visit (INDEPENDENT_AMBULATORY_CARE_PROVIDER_SITE_OTHER): Payer: No Typology Code available for payment source | Admitting: Family Medicine

## 2016-12-09 ENCOUNTER — Encounter: Payer: Self-pay | Admitting: Family Medicine

## 2016-12-09 VITALS — BP 118/80 | HR 60 | Temp 97.5°F | Ht 64.5 in | Wt 122.0 lb

## 2016-12-09 DIAGNOSIS — M545 Low back pain, unspecified: Secondary | ICD-10-CM

## 2016-12-09 MED ORDER — MELOXICAM 15 MG PO TABS: 15.0000 mg | ORAL_TABLET | Freq: Every day | ORAL | 1 refills | Status: DC | PRN

## 2016-12-09 MED ORDER — KETOROLAC TROMETHAMINE 60 MG/2ML IM SOLN
60.0000 mg | Freq: Once | INTRAMUSCULAR | Status: AC
  Administered 2016-12-09: 60 mg via INTRAMUSCULAR

## 2016-12-09 MED ORDER — CYCLOBENZAPRINE HCL 5 MG PO TABS: 5.0000 mg | ORAL_TABLET | Freq: Three times a day (TID) | ORAL | 1 refills | Status: DC | PRN
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
## 2016-12-09 NOTE — Telephone Encounter (Signed)
FYI Patient has made appt with Jordan Likes today at 10am

## 2016-12-09 NOTE — Assessment & Plan Note (Signed)
This is acute on chronic in nature. Does not appear to be associated with any kidney stones or infection. Appears muscular in nature. - Toradol injection today. - Sent mobic and Flexeril - Referral to physical therapy - Provided home exercises - If no improvement would obtain x-rays. Could consider trigger point injections.

## 2016-12-09 NOTE — Patient Instructions (Addendum)
Thank you for coming in,   I have referred you to physical therapy. I have started you on mobic and Flexeril. Please follow-up with me if you don not have any improvement of your symptoms.   Please feel free to call with any questions or concerns at any time, at [phone]. --Dr. Jordan Likes

## 2017-04-30 ENCOUNTER — Other Ambulatory Visit: Payer: Self-pay | Admitting: Obstetrics and Gynecology

## 2017-04-30 DIAGNOSIS — Z1231 Encounter for screening mammogram for malignant neoplasm of breast: Secondary | ICD-10-CM

## 2017-05-28 ENCOUNTER — Other Ambulatory Visit (INDEPENDENT_AMBULATORY_CARE_PROVIDER_SITE_OTHER): Payer: No Typology Code available for payment source

## 2017-05-28 ENCOUNTER — Encounter: Payer: Self-pay | Admitting: Internal Medicine

## 2017-05-28 ENCOUNTER — Ambulatory Visit (INDEPENDENT_AMBULATORY_CARE_PROVIDER_SITE_OTHER): Payer: No Typology Code available for payment source | Admitting: Internal Medicine

## 2017-05-28 VITALS — BP 110/60 | HR 52 | Temp 97.8°F | Ht 64.5 in | Wt 125.1 lb

## 2017-05-28 DIAGNOSIS — M545 Low back pain, unspecified: Secondary | ICD-10-CM

## 2017-05-28 DIAGNOSIS — J302 Other seasonal allergic rhinitis: Secondary | ICD-10-CM

## 2017-05-28 DIAGNOSIS — Z0000 Encounter for general adult medical examination without abnormal findings: Secondary | ICD-10-CM

## 2017-05-28 DIAGNOSIS — R202 Paresthesia of skin: Secondary | ICD-10-CM | POA: Diagnosis not present

## 2017-05-28 DIAGNOSIS — H1013 Acute atopic conjunctivitis, bilateral: Secondary | ICD-10-CM

## 2017-05-28 DIAGNOSIS — Z1211 Encounter for screening for malignant neoplasm of colon: Secondary | ICD-10-CM

## 2017-05-28 LAB — CBC WITH DIFFERENTIAL/PLATELET
BASOS PCT: 1.2 %
Basophils Absolute: 58 cells/uL (ref 0–200)
Eosinophils Absolute: 158 cells/uL (ref 15–500)
Eosinophils Relative: 3.3 %
HCT: 38.3 % (ref 35.0–45.0)
HEMOGLOBIN: 12.9 g/dL (ref 11.7–15.5)
LYMPHS ABS: 1915 {cells}/uL (ref 850–3900)
MCH: 30.8 pg (ref 27.0–33.0)
MCHC: 33.7 g/dL (ref 32.0–36.0)
MCV: 91.4 fL (ref 80.0–100.0)
MPV: 11.8 fL (ref 7.5–12.5)
Monocytes Relative: 8.9 %
NEUTROS PCT: 46.7 %
Neutro Abs: 2242 cells/uL (ref 1500–7800)
Platelets: 195 10*3/uL (ref 140–400)
RBC: 4.19 10*6/uL (ref 3.80–5.10)
RDW: 12 % (ref 11.0–15.0)
TOTAL LYMPHOCYTE: 39.9 %
WBC: 4.8 10*3/uL (ref 3.8–10.8)
WBCMIX: 427 {cells}/uL (ref 200–950)

## 2017-05-28 LAB — HEPATIC FUNCTION PANEL
ALT: 12 U/L (ref 0–35)
AST: 18 U/L (ref 0–37)
Albumin: 3.9 g/dL (ref 3.5–5.2)
Alkaline Phosphatase: 39 U/L (ref 39–117)
BILIRUBIN TOTAL: 0.5 mg/dL (ref 0.2–1.2)
Bilirubin, Direct: 0.1 mg/dL (ref 0.0–0.3)
Total Protein: 6.6 g/dL (ref 6.0–8.3)

## 2017-05-28 LAB — LIPID PANEL
CHOL/HDL RATIO: 3
Cholesterol: 201 mg/dL — ABNORMAL HIGH (ref 0–200)
HDL: 70 mg/dL (ref >39.00)
LDL Cholesterol: 124 mg/dL — ABNORMAL HIGH (ref 0–99)
NONHDL: 130.89
TRIGLYCERIDES: 34 mg/dL (ref 0.0–149.0)
VLDL: 6.8 mg/dL (ref 0.0–40.0)

## 2017-05-28 LAB — URINALYSIS, ROUTINE W REFLEX MICROSCOPIC
BILIRUBIN URINE: NEGATIVE
KETONES UR: NEGATIVE
Nitrite: NEGATIVE
PH: 6 (ref 5.0–8.0)
Total Protein, Urine: NEGATIVE
UROBILINOGEN UA: 0.2 (ref 0.0–1.0)
Urine Glucose: NEGATIVE

## 2017-05-28 LAB — BASIC METABOLIC PANEL
BUN: 18 mg/dL (ref 6–23)
CO2: 30 meq/L (ref 19–32)
Calcium: 8.8 mg/dL (ref 8.4–10.5)
Chloride: 105 mEq/L (ref 96–112)
Creatinine, Ser: 1 mg/dL (ref 0.40–1.20)
GFR: 59.59 mL/min — ABNORMAL LOW (ref >60.00)
GLUCOSE: 92 mg/dL (ref 70–99)
POTASSIUM: 4.1 meq/L (ref 3.5–5.1)
SODIUM: 139 meq/L (ref 135–145)

## 2017-05-28 LAB — VITAMIN D 25 HYDROXY (VIT D DEFICIENCY, FRACTURES): VITD: 41.15 ng/mL (ref 30.00–100.00)

## 2017-05-28 LAB — TSH: TSH: 2.67 u[IU]/mL (ref 0.35–4.50)

## 2017-05-28 MED ORDER — CYCLOBENZAPRINE HCL 5 MG PO TABS: 5.0000 mg | ORAL_TABLET | Freq: Three times a day (TID) | ORAL | 1 refills | Status: DC | PRN

## 2017-05-28 MED ORDER — MELOXICAM 15 MG PO TABS
15.0000 mg | ORAL_TABLET | Freq: Every day | ORAL | 3 refills | Status: DC | PRN
Start: 2017-05-28 — End: 2018-02-14

## 2017-05-28 MED ORDER — DIAZEPAM 2 MG PO TABS: 2.0000 mg | ORAL_TABLET | Freq: Three times a day (TID) | ORAL | 1 refills | Status: DC | PRN

## 2017-05-28 MED ORDER — CIPROFLOXACIN HCL 250 MG PO TABS: 250.0000 mg | ORAL_TABLET | Freq: Two times a day (BID) | ORAL | 1 refills | Status: DC

## 2017-05-28 MED ORDER — FEXOFENADINE HCL 180 MG PO TABS: 180.0000 mg | ORAL_TABLET | Freq: Every day | ORAL | 3 refills | Status: AC

## 2017-05-28 MED ORDER — PSEUDOEPHEDRINE HCL 60 MG PO TABS: 120.0000 mg | ORAL_TABLET | ORAL | 1 refills | Status: DC | PRN

## 2017-05-28 MED ORDER — DIPHENHYDRAMINE HCL 50 MG PO TABS: 50.0000 mg | ORAL_TABLET | Freq: Four times a day (QID) | ORAL | 1 refills | Status: AC | PRN

## 2017-05-28 NOTE — Assessment & Plan Note (Signed)
Allegra  

## 2017-05-28 NOTE — Assessment & Plan Note (Signed)
We discussed age appropriate health related issues, including available/recomended screening tests and vaccinations. We discussed a need for adhering to healthy diet and exercise. Labs were ordered to be later reviewed . All questions were answered.   

## 2017-05-28 NOTE — Assessment & Plan Note (Signed)
Meloxicam, Flexeril prn

## 2017-05-28 NOTE — Progress Notes (Signed)
Subjective:  Patient ID: Karen [name], female    DOB: 05-Jul-1955  Age: 62 y.o. MRN: [id number]  CC: No chief complaint on file.   HPI Karen [name] presents for a well exam.  Complaining of a recent onset severe back pain, currently is doing better with therapy.  Complaints of allergies  Outpatient Medications Prior to Visit  Medication Sig Dispense Refill  . Calcium-Vitamin D-Vitamin K (VIACTIV PO) Take 1 tablet by mouth 2 (two) times daily.    . cyclobenzaprine (FLEXERIL) 5 MG tablet Take 1 tablet (5 mg total) by mouth 3 (three) times daily as needed for muscle spasms. 30 tablet 1  . diazepam (VALIUM) 2 MG tablet Take 1 tablet (2 mg total) by mouth every 8 (eight) hours as needed for anxiety. 60 tablet 1  . EPINEPHrine (EPI-PEN) 0.3 mg/0.3 mL DEVI Inject 0.3 mg into the muscle as needed. Allergic reactions...anaphylaxis    . erythromycin ophthalmic ointment Place 1 application into both eyes at bedtime. 3.5 g 0  . estrogen-methylTESTOSTERone 0.625-1.25 MG per tablet Take 1 tablet by mouth daily. 90 tablet 3  . fexofenadine (ALLEGRA) 180 MG tablet take 1 tablet by mouth once daily 100 tablet 2  . fluticasone (FLONASE) 50 MCG/ACT nasal spray Place 2 sprays into both nostrils daily. 16 g 6  . meloxicam (MOBIC) 15 MG tablet Take 1 tablet (15 mg total) by mouth daily as needed. 30 tablet 1  . olopatadine (PATANOL) 0.1 % ophthalmic solution 1 drop as needed for allergies.    . vitamin C (ASCORBIC ACID) 500 MG tablet Take 500 mg by mouth daily.      . benzonatate (TESSALON) 200 MG capsule Take 1 capsule (200 mg total) by mouth 3 (three) times daily as needed for cough. (Patient not taking: Reported on 05/28/2017) 60 capsule 0  . ciprofloxacin (CIPRO) 250 MG tablet Take 1 tablet (250 mg total) by mouth 2 (two) times daily. (Patient not taking: Reported on 05/28/2017) 6 tablet 1  . promethazine-codeine (PHENERGAN WITH CODEINE) 6.25-10 MG/5ML syrup Take 5 mLs by mouth every 6 (six) hours as needed for  cough.     No facility-administered medications prior to visit.     ROS Review of Systems  Constitutional: Negative for activity change, appetite change, chills, fatigue and unexpected weight change.  HENT: Positive for rhinorrhea. Negative for congestion, mouth sores and sinus pressure.   Eyes: Negative for visual disturbance.  Respiratory: Negative for cough and chest tightness.   Gastrointestinal: Negative for abdominal pain and nausea.  Genitourinary: Negative for difficulty urinating, frequency and vaginal pain.  Musculoskeletal: Positive for arthralgias and back pain. Negative for gait problem.  Skin: Negative for pallor and rash.  Neurological: Negative for dizziness, tremors, weakness, numbness and headaches.  Psychiatric/Behavioral: Negative for confusion and sleep disturbance. The patient is nervous/anxious.     Objective:  BP 110/60 (BP Location: Left Arm, Patient Position: Sitting, Cuff Size: Normal)   Pulse (!) 52   Temp 97.8 F (36.6 C) (Oral)   Ht 5' 4.5" (1.638 m)   Wt 125 lb 2 oz (56.8 kg)   SpO2 100%   BMI 21.15 kg/m   BP Readings from Last 3 Encounters:  05/28/17 110/60  12/09/16 118/80  10/16/16 104/64    Wt Readings from Last 3 Encounters:  05/28/17 125 lb 2 oz (56.8 kg)  12/09/16 122 lb (55.3 kg)  10/16/16 122 lb (55.3 kg)    Physical Exam  Constitutional: She appears well-developed. No distress.  HENT:  Head: Normocephalic.  Right Ear: External ear normal.  Left Ear: External ear normal.  Nose: Nose normal.  Mouth/Throat: Oropharynx is clear and moist.  Eyes: Conjunctivae are normal. Pupils are equal, round, and reactive to light. Right eye exhibits no discharge. Left eye exhibits no discharge.  Neck: Normal range of motion. Neck supple. No JVD present. No tracheal deviation present. No thyromegaly present.  Cardiovascular: Normal rate, regular rhythm and normal heart sounds.  Pulmonary/Chest: No stridor. No respiratory distress. She has no  wheezes.  Abdominal: Soft. Bowel sounds are normal. She exhibits no distension and no mass. There is no tenderness. There is no rebound and no guarding.  Musculoskeletal: She exhibits tenderness. She exhibits no edema.  Lymphadenopathy:    She has no cervical adenopathy.  Neurological: She displays normal reflexes. No cranial nerve deficit. She exhibits normal muscle tone. Coordination normal.  Skin: No rash noted. No erythema.  Psychiatric: She has a normal mood and affect. Her behavior is normal. Judgment and thought content normal.    Lab Results  Component Value Date   WBC 5.0 10/16/2016   HGB 13.2 10/16/2016   HCT 39.4 10/16/2016   PLT 188.0 10/16/2016   GLUCOSE 95 05/27/2016   CHOL 206 (H) 05/27/2016   TRIG 35.0 05/27/2016   HDL 81.90 05/27/2016   LDLDIRECT 134.0 01/25/2012   LDLCALC 117 (H) 05/27/2016   ALT 15 05/27/2016   AST 21 05/27/2016   NA 136 05/27/2016   K 4.0 05/27/2016   CL 102 05/27/2016   CREATININE 0.94 05/27/2016   BUN 15 05/27/2016   CO2 29 05/27/2016   TSH 3.11 05/27/2016   INR 0.97 08/01/2011    Dg Chest 2 View  Result Date: 07/31/2016 CLINICAL DATA:  Cough for 1 week, patient had flu and sinus infection, was on antibiotics for 10 days EXAM: CHEST  2 VIEW COMPARISON:  01/01/2016 FINDINGS: Cardiomediastinal silhouette is unremarkable. No infiltrate or pleural effusion. No pulmonary edema. Bony thorax is unremarkable. IMPRESSION: No active cardiopulmonary disease. Electronically Signed   By: Natasha Mead M.D.   On: 07/31/2016 15:22    Assessment & Plan:   There are no diagnoses linked to this encounter. I am having Karen Ra maintain her vitamin C, EPINEPHrine, Calcium-Vitamin D-Vitamin K (VIACTIV PO), estrogen-methylTESTOSTERone, fluticasone, erythromycin, diazepam, ciprofloxacin, benzonatate, promethazine-codeine, olopatadine, fexofenadine, meloxicam, and cyclobenzaprine.  No orders of the defined types were placed in this  encounter.    Follow-up: No Follow-up on file.  Sonda Primes, MD

## 2017-05-31 ENCOUNTER — Telehealth: Payer: Self-pay | Admitting: *Deleted

## 2017-05-31 MED ORDER — PSEUDOEPHEDRINE HCL 60 MG PO TABS: 60.0000 mg | ORAL_TABLET | ORAL | 1 refills | Status: DC | PRN

## 2017-05-31 NOTE — Telephone Encounter (Signed)
Copied from CRM [id number]. Topic: General - Other >> May 31, 2017  2:21 PM Gerrianne ScalePayne, Angela L wrote: Reason for CRM: Cathy from [id number]  is calling about RX pseudoephedrine (SUDAFED) 60 MG tablet she wants to know what's  the correct dosage for pt stating that its to high for pt to take 60 mg of sudafed every 2 hours

## 2017-05-31 NOTE — Telephone Encounter (Signed)
Please change it to 60 mg 1 tablet every 4 hours as needed allergic reaction. Thank you

## 2017-06-01 MED ORDER — PSEUDOEPHEDRINE HCL 60 MG PO TABS: 60.0000 mg | ORAL_TABLET | ORAL | 1 refills | Status: AC | PRN

## 2017-06-01 NOTE — Telephone Encounter (Signed)
Updated rx w/correct directions. Sent new rx to [facility]Marland Kitchen.Raechel Chute/lmb

## 2017-06-23 ENCOUNTER — Ambulatory Visit
Admission: RE | Admit: 2017-06-23 | Discharge: 2017-06-23 | Disposition: A | Discharge status: Home / Self Care | Payer: No Typology Code available for payment source | Source: Ambulatory Visit | Attending: Obstetrics and Gynecology | Admitting: Obstetrics and Gynecology

## 2017-06-23 DIAGNOSIS — Z1231 Encounter for screening mammogram for malignant neoplasm of breast: Secondary | ICD-10-CM

## 2017-08-03 ENCOUNTER — Encounter: Payer: Self-pay | Admitting: Internal Medicine

## 2017-08-25 ENCOUNTER — Inpatient Hospital Stay (HOSPITAL_COMMUNITY)
Admission: EM | Admit: 2017-08-25 | Discharge: 2017-09-01 | DRG: 339 | Disposition: A | Discharge status: Home / Self Care | Payer: No Typology Code available for payment source | Attending: General Surgery | Admitting: General Surgery

## 2017-08-25 ENCOUNTER — Ambulatory Visit: Payer: No Typology Code available for payment source | Admitting: Internal Medicine

## 2017-08-25 ENCOUNTER — Encounter: Payer: Self-pay | Admitting: Internal Medicine

## 2017-08-25 DIAGNOSIS — K3532 Acute appendicitis with perforation and localized peritonitis, without abscess: Secondary | ICD-10-CM | POA: Diagnosis not present

## 2017-08-25 DIAGNOSIS — Z880 Allergy status to penicillin: Secondary | ICD-10-CM

## 2017-08-25 DIAGNOSIS — Z8249 Family history of ischemic heart disease and other diseases of the circulatory system: Secondary | ICD-10-CM

## 2017-08-25 DIAGNOSIS — Z885 Allergy status to narcotic agent status: Secondary | ICD-10-CM

## 2017-08-25 DIAGNOSIS — Z90710 Acquired absence of both cervix and uterus: Secondary | ICD-10-CM

## 2017-08-25 DIAGNOSIS — Z791 Long term (current) use of non-steroidal anti-inflammatories (NSAID): Secondary | ICD-10-CM

## 2017-08-25 DIAGNOSIS — N179 Acute kidney failure, unspecified: Secondary | ICD-10-CM | POA: Diagnosis not present

## 2017-08-25 DIAGNOSIS — R339 Retention of urine, unspecified: Secondary | ICD-10-CM | POA: Diagnosis not present

## 2017-08-25 DIAGNOSIS — Z91018 Allergy to other foods: Secondary | ICD-10-CM

## 2017-08-25 DIAGNOSIS — Z888 Allergy status to other drugs, medicaments and biological substances status: Secondary | ICD-10-CM

## 2017-08-25 DIAGNOSIS — I959 Hypotension, unspecified: Secondary | ICD-10-CM | POA: Diagnosis not present

## 2017-08-25 DIAGNOSIS — Z811 Family history of alcohol abuse and dependence: Secondary | ICD-10-CM

## 2017-08-25 DIAGNOSIS — A084 Viral intestinal infection, unspecified: Secondary | ICD-10-CM | POA: Insufficient documentation

## 2017-08-25 DIAGNOSIS — Z8349 Family history of other endocrine, nutritional and metabolic diseases: Secondary | ICD-10-CM

## 2017-08-25 DIAGNOSIS — Z7951 Long term (current) use of inhaled steroids: Secondary | ICD-10-CM

## 2017-08-25 DIAGNOSIS — K567 Ileus, unspecified: Secondary | ICD-10-CM

## 2017-08-25 HISTORY — DX: Other specified postprocedural states: Z98.890

## 2017-08-25 HISTORY — DX: Nausea with vomiting, unspecified: R11.2

## 2017-08-25 HISTORY — DX: Other complications of anesthesia, initial encounter: T88.59XA

## 2017-08-25 HISTORY — DX: Adverse effect of unspecified anesthetic, initial encounter: T41.45XA

## 2017-08-25 MED ORDER — ONDANSETRON HCL 4 MG PO TABS: 4.0000 mg | ORAL_TABLET | Freq: Three times a day (TID) | ORAL | 0 refills | Status: DC | PRN

## 2017-08-25 MED ORDER — ONDANSETRON HCL 4 MG/2ML IJ SOLN
4.0000 mg | Freq: Once | INTRAMUSCULAR | Status: AC
  Administered 2017-08-25: 4 mg via INTRAMUSCULAR

## 2017-08-25 NOTE — Assessment & Plan Note (Addendum)
Viral vs food poisoning  Zofran IM and PO Imodium prn To ER if not better in 4-6 hrs Oral hydration discussed

## 2017-08-25 NOTE — Progress Notes (Signed)
Subjective:  Patient ID: Karen [name], female    DOB: 03/05/1956  Age: 62 y.o. MRN: [id number]  CC: No chief complaint on file.   HPI Karen [name] presents for n/v and frequent stools starting last night. Did not sleep. Dry heaving. Weak.   Outpatient Medications Prior to Visit  Medication Sig Dispense Refill  . Calcium-Vitamin D-Vitamin K (VIACTIV PO) Take 1 tablet by mouth 2 (two) times daily.    . ciprofloxacin (CIPRO) 250 MG tablet Take 1 tablet (250 mg total) by mouth 2 (two) times daily. 6 tablet 1  . cyclobenzaprine (FLEXERIL) 5 MG tablet Take 1 tablet (5 mg total) by mouth 3 (three) times daily as needed for muscle spasms. 90 tablet 1  . diazepam (VALIUM) 2 MG tablet Take 1 tablet (2 mg total) by mouth every 8 (eight) hours as needed for anxiety. 60 tablet 1  . diphenhydrAMINE (BENADRYL) 50 MG tablet Take 1 tablet (50 mg total) by mouth every 6 (six) hours as needed for itching or allergies. 30 tablet 1  . EPINEPHrine (EPI-PEN) 0.3 mg/0.3 mL DEVI Inject 0.3 mg into the muscle as needed. Allergic reactions...anaphylaxis    . erythromycin ophthalmic ointment Place 1 application into both eyes at bedtime. 3.5 g 0  . estrogen-methylTESTOSTERone 0.625-1.25 MG per tablet Take 1 tablet by mouth daily. 90 tablet 3  . fexofenadine (ALLEGRA) 180 MG tablet Take 1 tablet (180 mg total) by mouth daily. 100 tablet 3  . fluticasone (FLONASE) 50 MCG/ACT nasal spray Place 2 sprays into both nostrils daily. 16 g 6  . meloxicam (MOBIC) 15 MG tablet Take 1 tablet (15 mg total) by mouth daily as needed. 90 tablet 3  . olopatadine (PATANOL) 0.1 % ophthalmic solution 1 drop as needed for allergies.    . pseudoephedrine (SUDAFED) 60 MG tablet Take 1 tablet (60 mg total) by mouth every 4 (four) hours as needed (allergic reaction). 60 tablet 1  . vitamin C (ASCORBIC ACID) 500 MG tablet Take 500 mg by mouth daily.       No facility-administered medications prior to visit.     ROS Review of Systems    Constitutional: Positive for chills and fatigue. Negative for activity change, appetite change and unexpected weight change.  HENT: Negative for congestion, mouth sores and sinus pressure.   Eyes: Negative for visual disturbance.  Respiratory: Negative for cough and chest tightness.   Gastrointestinal: Positive for diarrhea, nausea and vomiting. Negative for abdominal pain.  Genitourinary: Negative for difficulty urinating, frequency and vaginal pain.  Musculoskeletal: Positive for myalgias. Negative for back pain and gait problem.  Skin: Negative for pallor and rash.  Neurological: Positive for weakness. Negative for dizziness, tremors, numbness and headaches.  Psychiatric/Behavioral: Negative for confusion and sleep disturbance.    Objective:  BP (!) 106/58 (BP Location: Left Arm, Patient Position: Sitting, Cuff Size: Normal)   Pulse 68   Temp 99.1 F (37.3 C) (Oral)   Ht 5' 4.5" (1.638 m)   Wt 125 lb (56.7 kg)   SpO2 99%   BMI 21.12 kg/m   BP Readings from Last 3 Encounters:  08/25/17 (!) 106/58  05/28/17 110/60  12/09/16 118/80    Wt Readings from Last 3 Encounters:  08/25/17 125 lb (56.7 kg)  05/28/17 125 lb 2 oz (56.8 kg)  12/09/16 122 lb (55.3 kg)    Physical Exam  Constitutional: She appears well-developed. She appears distressed.  HENT:  Head: Normocephalic.  Right Ear: External ear normal.  Left Ear: External  ear normal.  Nose: Nose normal.  Mouth/Throat: Oropharynx is clear and moist.  Eyes: Pupils are equal, round, and reactive to light. Conjunctivae are normal. Right eye exhibits no discharge. Left eye exhibits no discharge.  Neck: Normal range of motion. Neck supple. No JVD present. No tracheal deviation present. No thyromegaly present.  Cardiovascular: Normal rate, regular rhythm and normal heart sounds.  Pulmonary/Chest: No stridor. No respiratory distress. She has no wheezes.  Abdominal: Soft. Bowel sounds are normal. She exhibits no distension and no  mass. There is tenderness. There is no rebound and no guarding.  Musculoskeletal: She exhibits no edema or tenderness.  Lymphadenopathy:    She has no cervical adenopathy.  Neurological: She displays normal reflexes. No cranial nerve deficit. She exhibits normal muscle tone. Coordination normal.  Skin: No rash noted. No erythema.  Psychiatric: She has a normal mood and affect. Her behavior is normal. Judgment and thought content normal.   Looks tired HEENT - dryish abd sensitive Non-toxic appearing  Lab Results  Component Value Date   WBC 4.8 05/28/2017   HGB 12.9 05/28/2017   HCT 38.3 05/28/2017   PLT 195 05/28/2017   GLUCOSE 92 05/28/2017   CHOL 201 (H) 05/28/2017   TRIG 34.0 05/28/2017   HDL 70.00 05/28/2017   LDLDIRECT 134.0 01/25/2012   LDLCALC 124 (H) 05/28/2017   ALT 12 05/28/2017   AST 18 05/28/2017   NA 139 05/28/2017   K 4.1 05/28/2017   CL 105 05/28/2017   CREATININE 1.00 05/28/2017   BUN 18 05/28/2017   CO2 30 05/28/2017   TSH 2.67 05/28/2017   INR 0.97 08/01/2011    Mm Digital Screening Bilateral  Result Date: 06/23/2017 CLINICAL DATA:  Screening. EXAM: DIGITAL SCREENING BILATERAL MAMMOGRAM WITH CAD COMPARISON:  Previous exam(s). ACR Breast Density Category b: There are scattered areas of fibroglandular density. FINDINGS: There are no findings suspicious for malignancy. Images were processed with CAD. IMPRESSION: No mammographic evidence of malignancy. A result letter of this screening mammogram will be mailed directly to the patient. RECOMMENDATION: Screening mammogram in one year. (Code:SM-B-01Y) BI-RADS CATEGORY  1: Negative. Electronically Signed   By: Sherian Rein M.D.   On: 06/23/2017 09:09    Assessment & Plan:   There are no diagnoses linked to this encounter. I am having [name] maintain her vitamin C, EPINEPHrine, Calcium-Vitamin D-Vitamin K (VIACTIV PO), estrogen-methylTESTOSTERone, fluticasone, erythromycin, olopatadine, cyclobenzaprine,  diazepam, fexofenadine, meloxicam, ciprofloxacin, diphenhydrAMINE, and pseudoephedrine.  No orders of the defined types were placed in this encounter.    Follow-up: No follow-ups on file.  Sonda Primes, MD

## 2017-08-25 NOTE — Patient Instructions (Signed)
Imodium  Nausea and Vomiting, Adult Nausea is the feeling that you have an upset stomach or have to vomit. As nausea gets worse, it can lead to vomiting. Vomiting occurs when stomach contents are thrown up and out of the mouth. Vomiting can make you feel weak and cause you to become dehydrated. Dehydration can make you tired and thirsty, cause you to have a dry mouth, and decrease how often you urinate. Older adults and people with other diseases or a weak immune system are at higher risk for dehydration. It is important to treat your nausea and vomiting as told by your health care provider. Follow these instructions at home: Follow instructions from your health care provider about how to care for yourself at home. Eating and drinking Follow these recommendations as told by your health care provider:  Take an oral rehydration solution (ORS). This is a drink that is sold at pharmacies and retail stores.  Drink clear fluids in small amounts as you are able. Clear fluids include water, ice chips, diluted fruit juice, and low-calorie sports drinks.  Eat bland, easy-to-digest foods in small amounts as you are able. These foods include bananas, applesauce, rice, lean meats, toast, and crackers.  Avoid fluids that contain a lot of sugar or caffeine, such as energy drinks, sports drinks, and soda.  Avoid alcohol.  Avoid spicy or fatty foods.  General instructions  Drink enough fluid to keep your urine clear or pale yellow.  Wash your hands often. If soap and water are not available, use hand sanitizer.  Make sure that all people in your household wash their hands well and often.  Take over-the-counter and prescription medicines only as told by your health care provider.  Rest at home while you recover.  Watch your condition for any changes.  Breathe slowly and deeply when you feel nauseated.  Keep all follow-up visits as told by your health care provider. This is important. Contact a  health care provider if:  You have a fever.  You cannot keep fluids down.  Your symptoms get worse.  You have new symptoms.  Your nausea does not go away after two days.  You feel light-headed or dizzy.  You have a headache.  You have muscle cramps. Get help right away if:  You have pain in your chest, neck, arm, or jaw.  You feel extremely weak or you faint.  You have persistent vomiting.  You see blood in your vomit.  Your vomit looks like black coffee grounds.  You have bloody or black stools or stools that look like tar.  You have a severe headache, a stiff neck, or both.  You have a rash.  You have severe pain, cramping, or bloating in your abdomen.  You have trouble breathing or you are breathing very quickly.  Your heart is beating very quickly.  Your skin feels cold and clammy.  You feel confused.  You have pain when you urinate.  You have signs of dehydration, such as: ? Dark urine, very little urine, or no urine. ? Cracked lips. ? Dry mouth. ? Sunken eyes. ? Sleepiness. ? Weakness. These symptoms may represent a serious problem that is an emergency. Do not wait to see if the symptoms will go away. Get medical help right away. Call your local emergency services (911 in the [place] Do not drive yourself to the hospital. This information is not intended to replace advice given to you by your health care provider. Make sure you discuss any  questions you have with your health care provider. Document Released: 04/06/2005 Document Revised: 09/09/2015 Document Reviewed: 12/11/2014 Elsevier Interactive Patient Education  [employer].

## 2017-08-25 NOTE — Addendum Note (Signed)
Addended by: Scarlett Presto on: 08/25/2017 03:16 PM   Modules accepted: Orders

## 2017-08-26 ENCOUNTER — Other Ambulatory Visit: Payer: Self-pay

## 2017-08-26 ENCOUNTER — Observation Stay (HOSPITAL_COMMUNITY): Payer: No Typology Code available for payment source | Admitting: Certified Registered Nurse Anesthetist

## 2017-08-26 ENCOUNTER — Encounter (HOSPITAL_COMMUNITY): Admission: EM | Disposition: A | Discharge status: Home / Self Care | Payer: Self-pay | Source: Home / Self Care

## 2017-08-26 ENCOUNTER — Emergency Department (HOSPITAL_COMMUNITY): Payer: No Typology Code available for payment source

## 2017-08-26 ENCOUNTER — Encounter (HOSPITAL_COMMUNITY): Payer: Self-pay | Admitting: Emergency Medicine

## 2017-08-26 DIAGNOSIS — K3532 Acute appendicitis with perforation and localized peritonitis, without abscess: Secondary | ICD-10-CM | POA: Diagnosis present

## 2017-08-26 HISTORY — PX: LAPAROSCOPIC APPENDECTOMY: SHX408

## 2017-08-26 HISTORY — PX: APPENDECTOMY: SHX54

## 2017-08-26 LAB — URINALYSIS, ROUTINE W REFLEX MICROSCOPIC
BACTERIA UA: NONE SEEN
BILIRUBIN URINE: NEGATIVE
Glucose, UA: NEGATIVE mg/dL
Ketones, ur: 20 mg/dL — AB
Leukocytes, UA: NEGATIVE
Nitrite: NEGATIVE
PH: 6 (ref 5.0–8.0)
Protein, ur: NEGATIVE mg/dL

## 2017-08-26 LAB — CBC WITH DIFFERENTIAL/PLATELET
BASOS ABS: 0 10*3/uL (ref 0.0–0.1)
Basophils Relative: 0 %
Eosinophils Absolute: 0 10*3/uL (ref 0.0–0.7)
Eosinophils Relative: 0 %
HEMATOCRIT: 37.1 % (ref 36.0–46.0)
HEMOGLOBIN: 12.3 g/dL (ref 12.0–15.0)
LYMPHS PCT: 13 %
Lymphs Abs: 1.9 10*3/uL (ref 0.7–4.0)
MCH: 31.5 pg (ref 26.0–34.0)
MCHC: 33.2 g/dL (ref 30.0–36.0)
MCV: 94.9 fL (ref 78.0–100.0)
MONO ABS: 1.1 10*3/uL — AB (ref 0.1–1.0)
MONOS PCT: 8 %
NEUTROS ABS: 11.3 10*3/uL — AB (ref 1.7–7.7)
NEUTROS PCT: 79 %
Platelets: 214 10*3/uL (ref 150–400)
RBC: 3.91 MIL/uL (ref 3.87–5.11)
RDW: 13.5 % (ref 11.5–15.5)
WBC: 14.3 10*3/uL — ABNORMAL HIGH (ref 4.0–10.5)

## 2017-08-26 LAB — COMPREHENSIVE METABOLIC PANEL
ALBUMIN: 3.8 g/dL (ref 3.5–5.0)
ALK PHOS: 50 U/L (ref 38–126)
ALT: 15 U/L (ref 14–54)
AST: 22 U/L (ref 15–41)
Anion gap: 8 (ref 5–15)
BILIRUBIN TOTAL: 1.2 mg/dL (ref 0.3–1.2)
BUN: 10 mg/dL (ref 6–20)
CO2: 25 mmol/L (ref 22–32)
Calcium: 8.6 mg/dL — ABNORMAL LOW (ref 8.9–10.3)
Chloride: 100 mmol/L — ABNORMAL LOW (ref 101–111)
Creatinine, Ser: 1.03 mg/dL — ABNORMAL HIGH (ref 0.44–1.00)
GFR calc Af Amer: >60 mL/min (ref >60)
GFR calc non Af Amer: 57 mL/min — ABNORMAL LOW (ref >60)
GLUCOSE: 144 mg/dL — AB (ref 65–99)
Potassium: 3.7 mmol/L (ref 3.5–5.1)
Sodium: 133 mmol/L — ABNORMAL LOW (ref 135–145)
Total Protein: 6.4 g/dL — ABNORMAL LOW (ref 6.5–8.1)

## 2017-08-26 LAB — LIPASE, BLOOD: Lipase: 25 U/L (ref 11–51)

## 2017-08-26 SURGERY — APPENDECTOMY, LAPAROSCOPIC
Anesthesia: General | Site: Abdomen

## 2017-08-26 MED ORDER — FENTANYL CITRATE (PF) 250 MCG/5ML IJ SOLN
INTRAMUSCULAR | Status: AC
  Filled 2017-08-26: qty 5

## 2017-08-26 MED ORDER — BUPIVACAINE-EPINEPHRINE (PF) 0.25% -1:200000 IJ SOLN
INTRAMUSCULAR | Status: AC
  Filled 2017-08-26: qty 30

## 2017-08-26 MED ORDER — MIDAZOLAM HCL 2 MG/2ML IJ SOLN
INTRAMUSCULAR | Status: DC | PRN
  Administered 2017-08-26: 2 mg via INTRAVENOUS

## 2017-08-26 MED ORDER — SCOPOLAMINE 1 MG/3DAYS TD PT72
MEDICATED_PATCH | TRANSDERMAL | Status: AC
  Filled 2017-08-26: qty 1

## 2017-08-26 MED ORDER — EST ESTROGENS-METHYLTEST 0.625-1.25 MG PO TABS: 1.0000 | ORAL_TABLET | Freq: Every day | ORAL | Status: DC

## 2017-08-26 MED ORDER — BUPIVACAINE-EPINEPHRINE 0.25% -1:200000 IJ SOLN
INTRAMUSCULAR | Status: DC | PRN
  Administered 2017-08-26: 10 mL

## 2017-08-26 MED ORDER — MORPHINE SULFATE (PF) 4 MG/ML IV SOLN
4.0000 mg | Freq: Once | INTRAVENOUS | Status: AC
  Administered 2017-08-26: 4 mg via INTRAVENOUS
  Filled 2017-08-26: qty 1

## 2017-08-26 MED ORDER — MIDAZOLAM HCL 2 MG/2ML IJ SOLN
INTRAMUSCULAR | Status: AC
  Filled 2017-08-26: qty 2

## 2017-08-26 MED ORDER — SUGAMMADEX SODIUM 200 MG/2ML IV SOLN
INTRAVENOUS | Status: DC | PRN
  Administered 2017-08-26: 200 mg via INTRAVENOUS

## 2017-08-26 MED ORDER — CIPROFLOXACIN IN D5W 400 MG/200ML IV SOLN
400.0000 mg | Freq: Once | INTRAVENOUS | Status: AC
  Administered 2017-08-26: 400 mg via INTRAVENOUS
  Filled 2017-08-26: qty 200

## 2017-08-26 MED ORDER — GABAPENTIN 300 MG PO CAPS
300.0000 mg | ORAL_CAPSULE | Freq: Once | ORAL | Status: AC
  Administered 2017-08-26: 300 mg via ORAL
  Filled 2017-08-26: qty 1

## 2017-08-26 MED ORDER — DOCUSATE SODIUM 100 MG PO CAPS
100.0000 mg | ORAL_CAPSULE | Freq: Two times a day (BID) | ORAL | Status: DC
  Administered 2017-08-26 – 2017-09-01 (×10): 100 mg via ORAL
  Filled 2017-08-26 (×11): qty 1

## 2017-08-26 MED ORDER — ACETAMINOPHEN 500 MG PO TABS
1000.0000 mg | ORAL_TABLET | Freq: Four times a day (QID) | ORAL | Status: DC
  Administered 2017-08-26 – 2017-08-29 (×10): 1000 mg via ORAL
  Filled 2017-08-26 (×13): qty 2

## 2017-08-26 MED ORDER — ONDANSETRON HCL 4 MG/2ML IJ SOLN
4.0000 mg | Freq: Four times a day (QID) | INTRAMUSCULAR | Status: DC | PRN
  Administered 2017-08-27 – 2017-08-28 (×2): 4 mg via INTRAVENOUS
  Filled 2017-08-26 (×2): qty 2

## 2017-08-26 MED ORDER — ONDANSETRON HCL 4 MG/2ML IJ SOLN
4.0000 mg | Freq: Once | INTRAMUSCULAR | Status: AC
  Administered 2017-08-26: 4 mg via INTRAVENOUS
  Filled 2017-08-26: qty 2

## 2017-08-26 MED ORDER — LIDOCAINE 2% (20 MG/ML) 5 ML SYRINGE
INTRAMUSCULAR | Status: DC | PRN
  Administered 2017-08-26: 100 mg via INTRAVENOUS

## 2017-08-26 MED ORDER — ONDANSETRON 4 MG PO TBDP
4.0000 mg | ORAL_TABLET | Freq: Four times a day (QID) | ORAL | Status: DC | PRN
  Administered 2017-08-26: 4 mg via ORAL
  Filled 2017-08-26: qty 1

## 2017-08-26 MED ORDER — ENOXAPARIN SODIUM 40 MG/0.4ML ~~LOC~~ SOLN
40.0000 mg | SUBCUTANEOUS | Status: DC
  Administered 2017-08-26 – 2017-08-31 (×5): 40 mg via SUBCUTANEOUS
  Filled 2017-08-26 (×6): qty 0.4

## 2017-08-26 MED ORDER — METRONIDAZOLE IN NACL 5-0.79 MG/ML-% IV SOLN
500.0000 mg | Freq: Three times a day (TID) | INTRAVENOUS | Status: DC
  Administered 2017-08-26 – 2017-09-01 (×18): 500 mg via INTRAVENOUS
  Filled 2017-08-26 (×19): qty 100

## 2017-08-26 MED ORDER — SODIUM CHLORIDE 0.9 % IV BOLUS
1000.0000 mL | Freq: Once | INTRAVENOUS | Status: AC
  Administered 2017-08-26: 1000 mL via INTRAVENOUS

## 2017-08-26 MED ORDER — DIAZEPAM 2 MG PO TABS
2.0000 mg | ORAL_TABLET | Freq: Three times a day (TID) | ORAL | Status: DC | PRN
  Administered 2017-08-27: 2 mg via ORAL
  Filled 2017-08-26: qty 1

## 2017-08-26 MED ORDER — ROCURONIUM BROMIDE 10 MG/ML (PF) SYRINGE
PREFILLED_SYRINGE | INTRAVENOUS | Status: DC | PRN
  Administered 2017-08-26: 20 mg via INTRAVENOUS
  Administered 2017-08-26: 30 mg via INTRAVENOUS

## 2017-08-26 MED ORDER — LACTATED RINGERS IV SOLN
INTRAVENOUS | Status: DC
  Administered 2017-08-26: 09:00:00 via INTRAVENOUS

## 2017-08-26 MED ORDER — DEXAMETHASONE SODIUM PHOSPHATE 10 MG/ML IJ SOLN
INTRAMUSCULAR | Status: DC | PRN
  Administered 2017-08-26: 10 mg via INTRAVENOUS

## 2017-08-26 MED ORDER — KETOROLAC TROMETHAMINE 15 MG/ML IJ SOLN
15.0000 mg | Freq: Three times a day (TID) | INTRAMUSCULAR | Status: DC
  Administered 2017-08-26 – 2017-08-27 (×3): 15 mg via INTRAVENOUS
  Filled 2017-08-26 (×3): qty 1

## 2017-08-26 MED ORDER — FLUTICASONE PROPIONATE 50 MCG/ACT NA SUSP: 2.0000 | Freq: Every day | NASAL | Status: DC | PRN

## 2017-08-26 MED ORDER — CIPROFLOXACIN IN D5W 400 MG/200ML IV SOLN
400.0000 mg | Freq: Two times a day (BID) | INTRAVENOUS | Status: DC
  Administered 2017-08-26 – 2017-09-01 (×12): 400 mg via INTRAVENOUS
  Filled 2017-08-26 (×12): qty 200

## 2017-08-26 MED ORDER — PHENYLEPHRINE 40 MCG/ML (10ML) SYRINGE FOR IV PUSH (FOR BLOOD PRESSURE SUPPORT)
PREFILLED_SYRINGE | INTRAVENOUS | Status: DC | PRN
  Administered 2017-08-26 (×4): 80 ug via INTRAVENOUS

## 2017-08-26 MED ORDER — PROMETHAZINE HCL 25 MG/ML IJ SOLN: 6.2500 mg | INTRAMUSCULAR | Status: DC | PRN

## 2017-08-26 MED ORDER — FAMOTIDINE IN NACL 20-0.9 MG/50ML-% IV SOLN
20.0000 mg | Freq: Two times a day (BID) | INTRAVENOUS | Status: DC
  Administered 2017-08-26 – 2017-08-29 (×7): 20 mg via INTRAVENOUS
  Filled 2017-08-26 (×8): qty 50

## 2017-08-26 MED ORDER — KETOROLAC TROMETHAMINE 30 MG/ML IJ SOLN
INTRAMUSCULAR | Status: DC | PRN
  Administered 2017-08-26: 30 mg via INTRAVENOUS

## 2017-08-26 MED ORDER — MEPERIDINE HCL 50 MG/ML IJ SOLN: 6.2500 mg | INTRAMUSCULAR | Status: DC | PRN

## 2017-08-26 MED ORDER — PHENYLEPHRINE HCL 10 MG/ML IJ SOLN
INTRAMUSCULAR | Status: DC | PRN
  Administered 2017-08-26: 25 ug/min via INTRAVENOUS

## 2017-08-26 MED ORDER — PROPOFOL 10 MG/ML IV BOLUS
INTRAVENOUS | Status: AC
  Filled 2017-08-26: qty 20

## 2017-08-26 MED ORDER — OLOPATADINE HCL 0.1 % OP SOLN: 1.0000 [drp] | Freq: Two times a day (BID) | OPHTHALMIC | Status: DC | PRN

## 2017-08-26 MED ORDER — PROPOFOL 10 MG/ML IV BOLUS
INTRAVENOUS | Status: DC | PRN
  Administered 2017-08-26: 150 mg via INTRAVENOUS

## 2017-08-26 MED ORDER — SCOPOLAMINE 1 MG/3DAYS TD PT72
MEDICATED_PATCH | TRANSDERMAL | Status: DC | PRN
  Administered 2017-08-26: 1 via TRANSDERMAL

## 2017-08-26 MED ORDER — SODIUM CHLORIDE 0.9 % IV BOLUS
1000.0000 mL | Freq: Once | INTRAVENOUS | Status: AC
Start: 2017-08-26 — End: 2017-08-26
  Administered 2017-08-26: 1000 mL via INTRAVENOUS

## 2017-08-26 MED ORDER — DIPHENHYDRAMINE HCL 12.5 MG/5ML PO ELIX: 12.5000 mg | ORAL_SOLUTION | Freq: Four times a day (QID) | ORAL | Status: DC | PRN

## 2017-08-26 MED ORDER — KCL IN DEXTROSE-NACL 20-5-0.45 MEQ/L-%-% IV SOLN
INTRAVENOUS | Status: DC
  Administered 2017-08-26 – 2017-08-31 (×11): via INTRAVENOUS
  Filled 2017-08-26 (×13): qty 1000

## 2017-08-26 MED ORDER — ACETAMINOPHEN 500 MG PO TABS
1000.0000 mg | ORAL_TABLET | Freq: Once | ORAL | Status: AC
  Administered 2017-08-26: 1000 mg via ORAL
  Filled 2017-08-26: qty 2

## 2017-08-26 MED ORDER — SODIUM CHLORIDE 0.9 % IR SOLN
Status: DC | PRN
  Administered 2017-08-26 (×4): 1000 mL

## 2017-08-26 MED ORDER — FENTANYL CITRATE (PF) 100 MCG/2ML IJ SOLN: 25.0000 ug | INTRAMUSCULAR | Status: DC | PRN

## 2017-08-26 MED ORDER — 0.9 % SODIUM CHLORIDE (POUR BTL) OPTIME
TOPICAL | Status: DC | PRN
  Administered 2017-08-26: 1000 mL

## 2017-08-26 MED ORDER — GABAPENTIN 300 MG PO CAPS
300.0000 mg | ORAL_CAPSULE | Freq: Two times a day (BID) | ORAL | Status: DC
  Administered 2017-08-26 – 2017-08-29 (×6): 300 mg via ORAL
  Filled 2017-08-26 (×7): qty 1

## 2017-08-26 MED ORDER — IOHEXOL 300 MG/ML  SOLN
100.0000 mL | Freq: Once | INTRAMUSCULAR | Status: AC | PRN
  Administered 2017-08-26: 100 mL via INTRAVENOUS

## 2017-08-26 MED ORDER — SODIUM CHLORIDE 0.9 % IV SOLN: 1.0000 g | Freq: Once | INTRAVENOUS | Status: DC

## 2017-08-26 MED ORDER — KETOROLAC TROMETHAMINE 30 MG/ML IJ SOLN: 30.0000 mg | Freq: Once | INTRAMUSCULAR | Status: DC | PRN

## 2017-08-26 MED ORDER — DIPHENHYDRAMINE HCL 50 MG/ML IJ SOLN: 12.5000 mg | Freq: Four times a day (QID) | INTRAMUSCULAR | Status: DC | PRN

## 2017-08-26 MED ORDER — FENTANYL CITRATE (PF) 250 MCG/5ML IJ SOLN
INTRAMUSCULAR | Status: DC | PRN
  Administered 2017-08-26 (×2): 50 ug via INTRAVENOUS

## 2017-08-26 MED ORDER — METRONIDAZOLE IN NACL 5-0.79 MG/ML-% IV SOLN
500.0000 mg | Freq: Once | INTRAVENOUS | Status: AC
  Administered 2017-08-26: 500 mg via INTRAVENOUS
  Filled 2017-08-26: qty 100

## 2017-08-26 MED ORDER — PROMETHAZINE HCL 25 MG/ML IJ SOLN
12.5000 mg | Freq: Four times a day (QID) | INTRAMUSCULAR | Status: DC | PRN
  Administered 2017-08-27 – 2017-08-28 (×2): 12.5 mg via INTRAVENOUS
  Filled 2017-08-26 (×3): qty 1

## 2017-08-26 MED ORDER — ONDANSETRON HCL 4 MG/2ML IJ SOLN
INTRAMUSCULAR | Status: DC | PRN
  Administered 2017-08-26: 4 mg via INTRAVENOUS

## 2017-08-26 SURGICAL SUPPLY — 46 items
APPLIER CLIP ROT 10 11.4 M/L (STAPLE)
BENZOIN TINCTURE PRP APPL 2/3 (GAUZE/BANDAGES/DRESSINGS) ×3 IMPLANT
BLADE CLIPPER SURG (BLADE) ×3 IMPLANT
CANISTER SUCT 3000ML PPV (MISCELLANEOUS) ×3 IMPLANT
CHLORAPREP W/TINT 26ML (MISCELLANEOUS) ×3 IMPLANT
CLIP APPLIE ROT 10 11.4 M/L (STAPLE) IMPLANT
CLOSURE WOUND 1/2 X4 (GAUZE/BANDAGES/DRESSINGS) ×1
COVER SURGICAL LIGHT HANDLE (MISCELLANEOUS) ×3 IMPLANT
CUTTER FLEX LINEAR 45M (STAPLE) ×3 IMPLANT
DEVICE PMI PUNCTURE CLOSURE (MISCELLANEOUS) ×3 IMPLANT
DRSG TEGADERM 2-3/8X2-3/4 SM (GAUZE/BANDAGES/DRESSINGS) ×3 IMPLANT
DRSG TEGADERM 4X4.75 (GAUZE/BANDAGES/DRESSINGS) IMPLANT
ELECT REM PT RETURN 9FT ADLT (ELECTROSURGICAL) ×3
ELECTRODE REM PT RTRN 9FT ADLT (ELECTROSURGICAL) ×1 IMPLANT
GAUZE SPONGE 2X2 8PLY STRL LF (GAUZE/BANDAGES/DRESSINGS) ×1 IMPLANT
GLOVE BIOGEL M STRL SZ7.5 (GLOVE) ×3 IMPLANT
GLOVE BIOGEL PI IND STRL 8 (GLOVE) ×2 IMPLANT
GLOVE BIOGEL PI INDICATOR 8 (GLOVE) ×4
GOWN STRL REUS W/ TWL LRG LVL3 (GOWN DISPOSABLE) ×2 IMPLANT
GOWN STRL REUS W/TWL 2XL LVL3 (GOWN DISPOSABLE) ×3 IMPLANT
GOWN STRL REUS W/TWL LRG LVL3 (GOWN DISPOSABLE) ×4
GRASPER SUT TROCAR 14GX15 (MISCELLANEOUS) IMPLANT
KIT BASIN OR (CUSTOM PROCEDURE TRAY) ×3 IMPLANT
KIT TURNOVER KIT B (KITS) ×3 IMPLANT
NS IRRIG 1000ML POUR BTL (IV SOLUTION) ×3 IMPLANT
PAD ARMBOARD 7.5X6 YLW CONV (MISCELLANEOUS) ×6 IMPLANT
POUCH RETRIEVAL ECOSAC 10 (ENDOMECHANICALS) ×1 IMPLANT
POUCH RETRIEVAL ECOSAC 10MM (ENDOMECHANICALS) ×2
RELOAD 45 VASCULAR/THIN (ENDOMECHANICALS) ×6 IMPLANT
RELOAD STAPLE TA45 3.5 REG BLU (ENDOMECHANICALS) IMPLANT
SCISSORS LAP 5X35 DISP (ENDOMECHANICALS) IMPLANT
SET IRRIG TUBING LAPAROSCOPIC (IRRIGATION / IRRIGATOR) ×3 IMPLANT
SHEARS HARMONIC ACE PLUS 36CM (ENDOMECHANICALS) ×3 IMPLANT
SLEEVE ENDOPATH XCEL 5M (ENDOMECHANICALS) ×3 IMPLANT
SPECIMEN JAR SMALL (MISCELLANEOUS) ×3 IMPLANT
SPONGE GAUZE 2X2 STER 10/PKG (GAUZE/BANDAGES/DRESSINGS) ×2
STRIP CLOSURE SKIN 1/2X4 (GAUZE/BANDAGES/DRESSINGS) ×2 IMPLANT
SUT MNCRL AB 4-0 PS2 18 (SUTURE) ×3 IMPLANT
SUT VICRYL 0 UR6 27IN ABS (SUTURE) IMPLANT
TOWEL OR 17X24 6PK STRL BLUE (TOWEL DISPOSABLE) ×3 IMPLANT
TRAY FOLEY CATH SILVER 16FR (SET/KITS/TRAYS/PACK) ×3 IMPLANT
TRAY LAPAROSCOPIC MC (CUSTOM PROCEDURE TRAY) ×3 IMPLANT
TROCAR XCEL BLUNT TIP 100MML (ENDOMECHANICALS) ×3 IMPLANT
TROCAR XCEL NON-BLD 5MMX100MML (ENDOMECHANICALS) ×3 IMPLANT
TUBING INSUFFLATION (TUBING) ×3 IMPLANT
WATER STERILE IRR 1000ML POUR (IV SOLUTION) ×3 IMPLANT

## 2017-08-26 NOTE — Anesthesia Preprocedure Evaluation (Signed)
Anesthesia Evaluation  Patient identified by MRN, date of birth, ID band Patient awake    Reviewed: Allergy & Precautions, NPO status , Patient's Chart, lab work & pertinent test results  History of Anesthesia Complications (+) PONV and history of anesthetic complications  Airway Mallampati: I       Dental no notable dental hx. (+) Teeth Intact   Pulmonary    Pulmonary exam normal breath sounds clear to auscultation       Cardiovascular negative cardio ROS Normal cardiovascular exam Rhythm:Regular Rate:Normal     Neuro/Psych PSYCHIATRIC DISORDERS Anxiety    GI/Hepatic negative GI ROS, Neg liver ROS,   Endo/Other  negative endocrine ROS  Renal/GU negative Renal ROS  negative genitourinary   Musculoskeletal   Abdominal Normal abdominal exam  (+)   Peds  Hematology negative hematology ROS (+)   Anesthesia Other Findings   Reproductive/Obstetrics                             Anesthesia Physical Anesthesia Plan  ASA: II  Anesthesia Plan: General   Post-op Pain Management:    Induction: Intravenous  PONV Risk Score and Plan: 4 or greater and Ondansetron, Dexamethasone, Midazolam and Scopolamine patch - Pre-op  Airway Management Planned: Oral ETT  Additional Equipment:   Intra-op Plan:   Post-operative Plan: Extubation in OR  Informed Consent: I have reviewed the patients History and Physical, chart, labs and discussed the procedure including the risks, benefits and alternatives for the proposed anesthesia with the patient or authorized representative who has indicated his/her understanding and acceptance.   Dental advisory given  Plan Discussed with: CRNA and Surgeon  Anesthesia Plan Comments:         Anesthesia Quick Evaluation

## 2017-08-26 NOTE — Anesthesia Procedure Notes (Signed)
Procedure Name: Intubation Date/Time: 08/26/2017 9:43 AM Performed by: Valda Favia, CRNA Pre-anesthesia Checklist: Patient identified, Emergency Drugs available, Suction available and Patient being monitored Patient Re-evaluated:Patient Re-evaluated prior to induction Oxygen Delivery Method: Circle System Utilized Preoxygenation: Pre-oxygenation with 100% oxygen Induction Type: IV induction Ventilation: Mask ventilation without difficulty Laryngoscope Size: Mac and 4 Grade View: Grade I Tube type: Oral Tube size: 7.0 mm Number of attempts: 1 Airway Equipment and Method: Stylet and Oral airway Placement Confirmation: ETT inserted through vocal cords under direct vision,  positive ETCO2 and breath sounds checked- equal and bilateral Secured at: 21 cm Tube secured with: Tape Dental Injury: Teeth and Oropharynx as per pre-operative assessment

## 2017-08-26 NOTE — Op Note (Signed)
Karen [name] [id number] July 06, 1955 08/26/2017  Appendectomy, Lap, Procedure Note  Indications: The patient presented with a history of right-sided abdominal pain. A CT revealed findings consistent with acute appendicitis.  Pre-operative Diagnosis: Acute appendicitis without mention of peritonitis  Post-operative Diagnosis: perforated appendicitis  Surgeon: Gaynelle Adu MD FACS  Assistants: Clenton Pare RNFA  Anesthesia: General endotracheal anesthesia  Procedure Details  The patient was seen again in the Holding Room. The risks, benefits, complications, treatment options, and expected outcomes were discussed with the patient and/or family. The possibilities of perforation of viscus, bleeding, recurrent infection, the need for additional procedures, failure to diagnose a condition, and creating a complication requiring transfusion or operation were discussed. There was concurrence with the proposed plan and informed consent was obtained. The site of surgery was properly noted. The patient was taken to Operating Room, identified as Karen [name] and the procedure verified as Appendectomy. A Time Out was held and the above information confirmed.  The patient was placed in the supine position and general anesthesia was induced, along with placement of orogastric tube, SCDs, and a Foley catheter. The abdomen was prepped and draped in a sterile fashion. A 1.5 centimeter infraumbilical incision was made.  The umbilical stalk was elevated, and the midline fascia was incised with a #11 blade.  A Kelly clamp was used to confirm entrance into the peritoneal cavity.  A pursestring suture was passed around the incision with a 0 Vicryl.  A 12mm Hasson was introduced into the abdomen and the tails of the suture were used to hold the Hasson in place.   The pneumoperitoneum was then established to steady pressure of 15 mmHg.  Additional 5 mm cannulas then placed in the left lower quadrant of the abdomen and the  suprapubic region under direct visualization. A careful evaluation of the entire abdomen was carried out. The patient was placed in Trendelenburg and left lateral decubitus position. The small intestines were retracted in the cephalad and left lateral direction away from the pelvis and right lower quadrant. The patient was found to have an inflamed perforated appendix that was extending into the pelvis. There was evidence of perforation.  Some of her small bowel was already starting to become dilated.  There is some inflammatory rind on some of the adjacent small bowel loops.  This was carefully peeled off and removed.  The appendix was carefully dissected. The appendix was was skeletonized with the harmonic scalpel.   The appendix was divided at its base using an endo-GIA stapler with a white load.  The staple line was essentially all the way across a small cuff of the cecum however there was about 1 mm of bowel still intact.  So therefore another fire of a stapler with a white load was used to completely staple across this.  No appendiceal stump was left in place. The appendix was removed from the abdomen with an Ecco bag through the umbilical port.  There was no evidence of bleeding, leakage, or complication after division of the appendix.  She had purulent fluid in her pelvis, some in her right paracolic gutter, and a fair amount above her right lobe of her liver.  4L of Irrigation was also performed and irrigate suctioned from the abdomen as well.  The umbilical port site was closed with the purse string suture. The closure was viewed laparoscopically. There was no residual palpable fascial defect.  The trocar site skin wounds were closed with 4-0 Monocryl.  Benzoin, Steri-Strips, and Band-Aids were applied  to the skin incisions.  Instrument, sponge, and needle counts were correct at the conclusion of the case.   Findings: The appendix was found to be inflamed. There were signs of necrosis.  There was  perforation. There was not abscess formation.  However there was a fair amount of purulent fluid as described above  Estimated Blood Loss:  Minimal         Drains: none         Specimens: appendix         Complications:  None; patient tolerated the procedure well.         Disposition: PACU - hemodynamically stable.         Condition: stable  Karen [name]. Andrey Campanile, MD, FACS General, Bariatric, & Minimally Invasive Surgery [facility], [place]

## 2017-08-26 NOTE — Discharge Instructions (Addendum)
Please arrive at least 30 min before your appointment to complete your check in paperwork.  If you are unable to arrive 30 min prior to your appointment time we may have to cancel or reschedule you.  LAPAROSCOPIC SURGERY: POST OP INSTRUCTIONS  1. DIET: Follow a light bland diet after arrival home, such as soup, liquids, crackers, etc. Be sure to include lots of fluids daily. Avoid fast food or heavy meals as your are more likely to get nauseated. Eat a low fat the next few days after surgery.  2. Take your usually prescribed home medications unless otherwise directed. 3. PAIN CONTROL:  1. Pain is best controlled by a usual combination of three different methods TOGETHER:  1. Ice/Heat 2. Over the counter pain medication 3. Prescription pain medication 2. Most patients will experience some swelling and bruising around the incisions. Ice packs or heating pads (30-60 minutes up to 6 times a day) will help. Use ice for the first few days to help decrease swelling and bruising, then switch to heat to help relax tight/sore spots and speed recovery. Some people prefer to use ice alone, heat alone, alternating between ice & heat. Experiment to what works for you. Swelling and bruising can take several weeks to resolve.  3. It is helpful to take an over-the-counter pain medication regularly for the first few weeks. Choose one of the following that works best for you:  1. Naproxen (Aleve, etc) Two  tabs twice a day 2. Ibuprofen (Advil, etc) Three  tabs four times a day (every meal & bedtime) 3. Acetaminophen (Tylenol, etc) 500-650mg  four times a day (every meal & bedtime) 4. A prescription for pain medication (such as oxycodone, hydrocodone, etc) should be given to you upon discharge. Take your pain medication as prescribed.  1. If you are having problems/concerns with the prescription medicine (does not control pain, nausea, vomiting, rash, itching, etc), please call us [phone] to see if we  need to switch you to a different pain medicine that will work better for you and/or control your side effect better. 2. If you need a refill on your pain medication, please contact your pharmacy. They will contact our office to request authorization. Prescriptions will not be filled after 5 pm or on week-ends. 4. Avoid getting constipated. Between the surgery and the pain medications, it is common to experience some constipation. Increasing fluid intake and taking a fiber supplement (such as Metamucil, Citrucel, FiberCon, MiraLax, etc) 1-2 times a day regularly will usually help prevent this problem from occurring. A mild laxative (prune juice, Milk of Magnesia, MiraLax, etc) should be taken according to package directions if there are no bowel movements after 48 hours.  5. Watch out for diarrhea. If you have many loose bowel movements, simplify your diet to bland foods & liquids for a few days. Stop any stool softeners and decrease your fiber supplement. Switching to mild anti-diarrheal medications (Kayopectate, Pepto Bismol) can help. If this worsens or does not improve, please call us. 6. Wash / shower every day. You may shower over the dressings as they are waterproof. Continue to shower over incision(s) after the dressing is off. If there is glue over the incisions try not to pick it off, let it fall off naturally. 7. Leave steri strips in place and allow them to fall off by themselves. You may leave the incision open to air. You may replace a dressing/Band-Aid to cover the incision for comfort if you wish.  8. ACTIVITIES as tolerated:  1. You may resume regular (light) daily activities beginning the next day--such as daily self-care, walking, climbing stairs--gradually increasing activities as tolerated. If you can walk 30 minutes without difficulty, it is safe to try more intense activity such as jogging, treadmill, bicycling, low-impact aerobics, swimming, etc. 2. Save the most intensive and  strenuous activity for last such as sit-ups, heavy lifting, contact sports, etc Refrain from any heavy lifting or straining until you are off narcotics for pain control. For the first 2-3 weeks do not lift over 10-15lb.  3. DO NOT PUSH THROUGH PAIN. Let pain be your guide: If it hurts to do something, don't do it. Pain is your body warning you to avoid that activity for another week until the pain goes down. 4. You may drive when you are no longer taking prescription pain medication, you can comfortably wear a seatbelt, and you can safely maneuver your car and apply brakes. 5. You may have sexual intercourse when it is comfortable.  9. FOLLOW UP in our office  1. Please call [facility] at [phone] to set up an appointment to see your surgeon in the office for a follow-up appointment approximately 2-3 weeks after your surgery. 2. Make sure that you call for this appointment the day you arrive home to insure a convenient appointment time.      10. IF YOU HAVE DISABILITY OR FAMILY LEAVE FORMS, BRING THEM TO THE               OFFICE FOR PROCESSING.   WHEN TO CALL us [phone]:  1. Poor pain control 2. Reactions / problems with new medications (rash/itching, nausea, etc)  3. Fever over 101.5 F (38.5 C) 4. Inability to urinate 5. Nausea and/or vomiting 6. Worsening swelling or bruising 7. Continued bleeding from incision. 8. Increased pain, redness, or drainage from the incision  The clinic staff is available to answer your questions during regular business hours (8:30am-5pm). Please dont hesitate to call and ask to speak to one of our nurses for clinical concerns.  If you have a medical emergency, go to the nearest emergency room or call 911.  A surgeon from [facility] is always on call at the [facility], [place]  [address], [address], [place], [place] [address] ?  MAIN: [phone] ? TOLL FREE: [phone] ?  FAX [phone]   [email or web address]

## 2017-08-26 NOTE — H&P (Signed)
Karen [name] 04/30/1955  [id number].    Chief Complaint/Reason for Consult: acute appendicitis  HPI:  This is a 62 yo white female with no significant PMH who began having some nausea and abdominal pain on Tuesday night.  She began to dry heave as she has not had much to eat.  She saw her PCP the following day as nothing helped her nausea or her pain.  She began to feel hot and had a temp of 99.  Her PCP gave her a shot of anti-emetic which helped her nausea.  She was diagnosed with food poisoning.  He didn't say much about her abdominal pain per the patient.  Her nausea returned after the medication wore off.  She presented to the ED with horrible RLQ abdominal pain and nausea.  She had a CT scan that revealed acute appendicitis that did appear to be perforated.  We have been asked to see her for admission.  ROS: ROS: Please see HPI, otherwise negative, except +bloating, -diarrhea.  Family History  Problem Relation Age of Onset  . Hypertension Mother   . Heart disease Father        CABG  . Hyperlipidemia Father   . Hypertension Father   . Obesity Father   . Gout Father   . Alcohol abuse Father   . HIV Brother   . Cancer Neg Hx   . Diabetes Neg Hx     Past Medical History:  Diagnosis Date  . Arthritis    right  . Climacteric    on HRT  . Epiglottitis 1959   tracheotomy  . Finger pain    pain with pressure on the PIP joint with hemorrhage and swelling. MRI hand inconclusive   . History of varicella   . Lumbar back pain 1999    initial strain. Intermittent pain since. Re-injured Dec '11. Had PT IN THE PAST.  Marland Kitchen Plantar fasciitis   . Pneumonia 1993   hospitalized    Past Surgical History:  Procedure Laterality Date  . ABDOMINAL HYSTERECTOMY     endometriosis  . ARTHROSCOPIC REPAIR ACL  1995   right-reconstructed with patellar tendon  . FOOT SURGERY  1999   left foot-arch ligament release (metatarsal release ?)  . MENISCUS REPAIR  1975   right medial/ open  .  REDUCTION MAMMAPLASTY Bilateral 1976  . reduction mammoplasty  1975  . TOTAL ABDOMINAL HYSTERECTOMY W/ BILATERAL SALPINGOOPHORECTOMY  '02    Social History:  reports that she has never smoked. She has never used smokeless tobacco. She reports that she does not drink alcohol or use drugs.  Allergies:  Allergies  Allergen Reactions  . Penicillins Anaphylaxis    anaphylactic reaction  . Hydrocodone     nausea  . Singulair [Montelukast Sodium]     numbness  . Tape     Adhesive reaction     (Not in a hospital admission)   Physical Exam: Blood pressure (!) 91/54, pulse 75, temperature 98.5 F (36.9 C), temperature source Oral, resp. rate 18, height 5' 4.5" (1.638 m), weight 56.7 kg (125 lb), SpO2 97 %. General: pleasant, WD, WN white female who is laying in bed in some discomfort secondary to pain HEENT: head is normocephalic, atraumatic.  Sclera are noninjected.  PERRL.  Ears and nose without any masses or lesions.  Mouth is pink and moist Heart: regular, rate, and rhythm.  Normal s1,s2. No obvious murmurs, gallops, or rubs noted.  Palpable radial and pedal pulses bilaterally  Lungs: CTAB, no wheezes, rhonchi, or rales noted.  Respiratory effort nonlabored Abd: soft, diffusely tender throughout her lower abdomen, but greatest in the RLQ, ND, +BS, no masses, hernias, or organomegaly MS: all 4 extremities are symmetrical with no cyanosis, clubbing, or edema. Skin: warm and dry with no masses, lesions, or rashes Psych: A&Ox3 with an appropriate affect.   Results for orders placed or performed during the hospital encounter of 08/25/17 (from the past 48 hour(s))  Urinalysis, Routine w reflex microscopic     Status: Abnormal   Collection Time: 08/26/17 12:17 AM  Result Value Ref Range   Color, Urine YELLOW YELLOW   APPearance CLEAR CLEAR   Specific Gravity, Urine >1.046 (H) 1.005 - 1.030   pH 6.0 5.0 - 8.0   Glucose, UA NEGATIVE NEGATIVE mg/dL   Hgb urine dipstick MODERATE (A)  NEGATIVE   Bilirubin Urine NEGATIVE NEGATIVE   Ketones, ur 20 (A) NEGATIVE mg/dL   Protein, ur NEGATIVE NEGATIVE mg/dL   Nitrite NEGATIVE NEGATIVE   Leukocytes, UA NEGATIVE NEGATIVE   RBC / HPF 6-10 0 - 5 RBC/hpf   Bacteria, UA NONE SEEN NONE SEEN   Squamous Epithelial / LPF 0-5 0 - 5    Comment: Please note change in reference range. Performed at [facility], [place] [address] [place], [place] [address]   Lipase, blood     Status: None   Collection Time: 08/26/17 12:18 AM  Result Value Ref Range   Lipase 25 11 - 51 U/L    Comment: Performed at [facility] [address] [place], [place] [address]  Comprehensive metabolic panel     Status: Abnormal   Collection Time: 08/26/17 12:18 AM  Result Value Ref Range   Sodium 133 (L) 135 - 145 mmol/L   Potassium 3.7 3.5 - 5.1 mmol/L   Chloride 100 (L) 101 - 111 mmol/L   CO2 25 22 - 32 mmol/L   Glucose, Bld 144 (H) 65 - 99 mg/dL   BUN 10 6 - 20 mg/dL   Creatinine, Ser 1.03 (H) 0.44 - 1.00 mg/dL   Calcium 8.6 (L) 8.9 - 10.3 mg/dL   Total Protein 6.4 (L) 6.5 - 8.1 g/dL   Albumin 3.8 3.5 - 5.0 g/dL   AST 22 15 - 41 U/L   ALT 15 14 - 54 U/L   Alkaline Phosphatase 50 38 - 126 U/L   Total Bilirubin 1.2 0.3 - 1.2 mg/dL   GFR calc non Af Amer 57 (L) >60 mL/min   GFR calc Af Amer >60 >60 mL/min    Comment: (NOTE) The eGFR has been calculated using the CKD EPI equation. This calculation has not been validated in all clinical situations. eGFR's persistently <60 mL/min signify possible Chronic Kidney Disease.    Anion gap 8 5 - 15    Comment: Performed at [facility] [address], [place], [place] [address]  CBC with Differential     Status: Abnormal   Collection Time: 08/26/17 12:21 AM  Result Value Ref Range   WBC 14.3 (H) 4.0 - 10.5 K/uL   RBC 3.91 3.87 - 5.11 MIL/uL   Hemoglobin 12.3 12.0 - 15.0 g/dL   HCT 37.1 36.0 - 46.0 %   MCV 94.9 78.0 - 100.0 fL   MCH 31.5 26.0 - 34.0 pg   MCHC 33.2 30.0 - 36.0 g/dL    RDW 13.5 11.5 - 15.5 %   Platelets 214 150 - 400 K/uL  Neutrophils Relative % 79 %   Neutro Abs 11.3 (H) 1.7 - 7.7 K/uL   Lymphocytes Relative 13 %   Lymphs Abs 1.9 0.7 - 4.0 K/uL   Monocytes Relative 8 %   Monocytes Absolute 1.1 (H) 0.1 - 1.0 K/uL   Eosinophils Relative 0 %   Eosinophils Absolute 0.0 0.0 - 0.7 K/uL   Basophils Relative 0 %   Basophils Absolute 0.0 0.0 - 0.1 K/uL    Comment: Performed at [facility] [address] [place], [place] [address]   Ct Abdomen Pelvis W Contrast  Result Date: 08/26/2017 CLINICAL DATA:  62 year old female with abdominal pain. Concern for acute appendicitis. EXAM: CT ABDOMEN AND PELVIS WITH CONTRAST TECHNIQUE: Multidetector CT imaging of the abdomen and pelvis was performed using the standard protocol following bolus administration of intravenous contrast. CONTRAST:  136m OMNIPAQUE IOHEXOL 300 MG/ML  SOLN COMPARISON:  Ultrasound dated 06/10/2016 FINDINGS: Lower chest: The visualized lung bases are clear. Hepatobiliary: No focal liver abnormality is seen. No gallstones, gallbladder wall thickening, or biliary dilatation. Pancreas: Unremarkable. No pancreatic ductal dilatation or surrounding inflammatory changes. Spleen: Normal in size without focal abnormality. Adrenals/Urinary Tract: Adrenal glands are unremarkable. Kidneys are normal, without renal calculi, focal lesion, or hydronephrosis. Bladder is unremarkable. Stomach/Bowel: There is no bowel dilatation or evidence of obstruction. Moderate stool noted throughout the colon. The appendix is enlarged and inflamed. There is an 11 mm stone in the midportion of the appendix. The appendix is located in the right lower quadrant inferior to the cecum and extending into the right hemipelvis along the right pelvic sidewall. The appendix measures approximately 12 mm in diameter. There is apparent focal area of discontinuity of the wall of the appendix (coronal series 6, image 51) suspicious for perforation.  There is a small focus of air in the right hemipelvis adjacent to the sigmoid colon (coronal series 6, image 59) likely a focally contained perforation. There is a small amount of complex appearing fluid within the pelvis with enhancement of the adjacent peritoneum. Vascular/Lymphatic: No significant vascular findings are present. No enlarged abdominal or pelvic lymph nodes. Reproductive: Hysterectomy. Other: None Musculoskeletal: No acute or significant osseous findings. Grade 1 L4-L5 anterolisthesis. IMPRESSION: Acute appendicitis with findings of perforation. Small complex fluid within the pelvis with peripheral enhancement is concerning for early abscess formation. These results were called by telephone at the time of interpretation on 08/26/2017 at 3:40 am to Dr. DDelora Fuel, who verbally acknowledged these results. Electronically Signed   By: AAnner CreteM.D.   On: 08/26/2017 03:42      Assessment/Plan Acute appendicitis, likely perforated The patient will be admitted and taken to the OR where she will undergo appendectomy.  She has been started on cipro/flagyl secondary to a PCN allergy.  She is NPO and otherwise ready for OR.  She will be given tylenol and gabapentin on call to the OR as well.  She does have an adhesive tape allergy and this has been thoroughly discussed with her.  She would like to try steri-strips after her operation as she thinks this is her best option.  I have discussed the procedure and risks of appendectomy. The risks include but are not limited to bleeding, infection, wound problems, anesthesia, injury to intra-abdominal organs, possibility of postoperative ileus, and possibility of an open procedure with further bowel resection. She seems to understand and agrees with the plan.  KHenreitta Cea PWellmont Mountain View Regional Medical CenterSurgery 08/26/2017, 8:17 AM Pager: [phone]

## 2017-08-26 NOTE — Progress Notes (Signed)
BP 88/55, Karen [name] was made aware with order to give Normal Saline bolus to run for an hour.

## 2017-08-26 NOTE — ED Triage Notes (Signed)
Patient with 24 hours of nausea and vomiting.  She is having abdominal pain in right lower quadrant upon palpation.  She went to see PCP who gave an antiemetic there which has warn off.  She is afebrile, pale and diaphoretic at this time.

## 2017-08-26 NOTE — ED Provider Notes (Signed)
[facility] EMERGENCY DEPARTMENT Provider Note   CSN: [id number] Arrival date & time: 08/25/17  2344     History   Chief Complaint Chief Complaint  Patient presents with  . Abdominal Pain  . Nausea  . Emesis    HPI Karen [name] is a 62 y.o. female.  The history is provided by the patient.  She complains of nausea and vomiting which started last night.  She has had frequent bowel movements, but not diarrhea.  She states she only passes a small amount of stool each time.  She saw her primary care provider who thought she had viral gastroenteritis and gave her an injection of ondansetron and sent her home with prescription for ondansetron.  Following injection, nausea did subside for several hours and she was able to have some sips of water.  Unfortunately, nausea came back more severe and she has not been able to hold down the ondansetron tablets which were prescribed for her.  She is now having dry heaves.  She is also complaining of pain in the right lower abdomen.  Pain is severe and she rates it a 10/10.  Nothing makes it better, nothing makes it worse.  She has had subjective fever but no chills or sweats.  She denies any urinary difficulty.  Of note, she has had abdominal surgery-abdominal hysterectomy.  She still has her appendix.  Past Medical History:  Diagnosis Date  . Arthritis    right  . Climacteric    on HRT  . Epiglottitis 1959   tracheotomy  . Finger pain    pain with pressure on the PIP joint with hemorrhage and swelling. MRI hand inconclusive   . History of varicella   . Lumbar back pain 1999    initial strain. Intermittent pain since. Re-injured Dec '11. Had PT IN THE PAST.  Marland Kitchen Plantar fasciitis   . Pneumonia 1993   hospitalized    Patient Active Problem List   Diagnosis Date Noted  . Viral gastroenteritis 08/25/2017  . Upper airway cough syndrome 10/16/2016  . Asthmatic bronchitis 07/31/2016  . Microhematuria 06/04/2016  . Well adult  exam 05/27/2016  . Paresthesia 05/27/2016  . Tinea pedis 05/27/2016  . Laryngitis 03/24/2016  . Pain in joint, shoulder region 03/24/2016  . Chest pain 01/01/2016  . Allergic rhinitis 08/06/2015  . Allergic conjunctivitis 08/06/2015  . Grief 08/06/2015  . Anaphylactic reaction 05/02/2015  . Screen for colon cancer 05/02/2015  . Muscle spasms of neck 10/14/2014  . Left groin pain 01/26/2012  . Foot cramps 01/26/2012  . Pneumonia, organism unspecified(486) 08/04/2011  . Healthcare maintenance 11/23/2010  . Arthritis   . Lumbar back pain   . Finger pain   . Surgical menopause     Past Surgical History:  Procedure Laterality Date  . ABDOMINAL HYSTERECTOMY     endometriosis  . ARTHROSCOPIC REPAIR ACL  1995   right-reconstructed with patellar tendon  . FOOT SURGERY  1999   left foot-arch ligament release (metatarsal release ?)  . MENISCUS REPAIR  1975   right medial/ open  . REDUCTION MAMMAPLASTY Bilateral 1976  . reduction mammoplasty  1975  . TOTAL ABDOMINAL HYSTERECTOMY W/ BILATERAL SALPINGOOPHORECTOMY  '02     OB History   None      Home Medications    Prior to Admission medications   Medication Sig Start Date End Date Taking? Authorizing Provider  Calcium-Vitamin D-Vitamin K (VIACTIV PO) Take 1 tablet by mouth 2 (two) times daily.  [provider]  ciprofloxacin (CIPRO) 250 MG tablet Take 1 tablet (250 mg total) by mouth 2 (two) times daily. 05/28/17   Plotnikov, Georgina Quint, MD  cyclobenzaprine (FLEXERIL) 5 MG tablet Take 1 tablet (5 mg total) by mouth 3 (three) times daily as needed for muscle spasms. 05/28/17   Plotnikov, Georgina Quint, MD  diazepam (VALIUM) 2 MG tablet Take 1 tablet (2 mg total) by mouth every 8 (eight) hours as needed for anxiety. 05/28/17   Plotnikov, Georgina Quint, MD  diphenhydrAMINE (BENADRYL) 50 MG tablet Take 1 tablet (50 mg total) by mouth every 6 (six) hours as needed for itching or allergies. 05/28/17   Plotnikov, Georgina Quint, MD  EPINEPHrine  (EPI-PEN) 0.3 mg/0.3 mL DEVI Inject 0.3 mg into the muscle as needed. Allergic reactions...anaphylaxis    [provider]  erythromycin ophthalmic ointment Place 1 application into both eyes at bedtime. 08/06/15   Plotnikov, Georgina Quint, MD  estrogen-methylTESTOSTERone 0.625-1.25 MG per tablet Take 1 tablet by mouth daily. 02/02/14   Margaree Mackintosh, MD  fexofenadine (ALLEGRA) 180 MG tablet Take 1 tablet (180 mg total) by mouth daily. 05/28/17   Plotnikov, Georgina Quint, MD  fluticasone (FLONASE) 50 MCG/ACT nasal spray Place 2 sprays into both nostrils daily. 08/06/15   Plotnikov, Georgina Quint, MD  meloxicam (MOBIC) 15 MG tablet Take 1 tablet (15 mg total) by mouth daily as needed. 05/28/17   Plotnikov, Georgina Quint, MD  olopatadine (PATANOL) 0.1 % ophthalmic solution 1 drop as needed for allergies.    [provider]  ondansetron (ZOFRAN) 4 MG tablet Take 1 tablet (4 mg total) by mouth every 8 (eight) hours as needed for nausea or vomiting. 08/25/17   Plotnikov, Georgina Quint, MD  pseudoephedrine (SUDAFED) 60 MG tablet Take 1 tablet (60 mg total) by mouth every 4 (four) hours as needed (allergic reaction). 06/01/17   Plotnikov, Georgina Quint, MD  vitamin C (ASCORBIC ACID) 500 MG tablet Take 500 mg by mouth daily.      [provider]    Family History Family History  Problem Relation Age of Onset  . Hypertension Mother   . Heart disease Father        CABG  . Hyperlipidemia Father   . Hypertension Father   . Obesity Father   . Gout Father   . Alcohol abuse Father   . HIV Brother   . Cancer Neg Hx   . Diabetes Neg Hx     Social History Social History   Tobacco Use  . Smoking status: Never Smoker  . Smokeless tobacco: Never Used  Substance Use Topics  . Alcohol use: No  . Drug use: No     Allergies   Penicillins; Hydrocodone; Singulair [montelukast sodium]; and Tape   Review of Systems Review of Systems  All other systems reviewed and are negative.    Physical  Exam Updated Vital Signs BP (!) 119/58 (BP Location: Right Arm)   Pulse 65   Temp 98.5 F (36.9 C) (Oral)   Resp 16   Ht 5' 4.5" (1.638 m)   Wt 56.7 kg (125 lb)   SpO2 100%   BMI 21.12 kg/m   Physical Exam  Nursing note and vitals reviewed.  62 year old female, appears uncomfortable with frequent dry heaves, but is in no acute distress. Vital signs are normal. Oxygen saturation is 100%, which is normal. Head is normocephalic and atraumatic. PERRLA, EOMI. Oropharynx is clear. Neck is nontender and supple without adenopathy or  JVD. Back is nontender and there is no CVA tenderness. Lungs are clear without rales, wheezes, or rhonchi. Chest is nontender. Heart has regular rate and rhythm without murmur. Abdomen is soft, flat, with moderate tenderness in the right lower quadrant.  There is no rebound or guarding.  There are no masses or hepatosplenomegaly and peristalsis is hypoactive. Extremities have no cyanosis or edema, full range of motion is present. Skin is warm and dry without rash. Neurologic: Mental status is normal, cranial nerves are intact, there are no motor or sensory deficits.  ED Treatments / Results  Labs (all labs ordered are listed, but only abnormal results are displayed) Labs Reviewed  COMPREHENSIVE METABOLIC PANEL - Abnormal; Notable for the following components:      Result Value   Sodium 133 (*)    Chloride 100 (*)    Glucose, Bld 144 (*)    Creatinine, Ser 1.03 (*)    Calcium 8.6 (*)    Total Protein 6.4 (*)    GFR calc non Af Amer 57 (*)    All other components within normal limits  CBC WITH DIFFERENTIAL/PLATELET - Abnormal; Notable for the following components:   WBC 14.3 (*)    Neutro Abs 11.3 (*)    Monocytes Absolute 1.1 (*)    All other components within normal limits  LIPASE, BLOOD  URINALYSIS, ROUTINE W REFLEX MICROSCOPIC   Radiology Ct Abdomen Pelvis W Contrast  Result Date: 08/26/2017 CLINICAL DATA:  62 year old female with abdominal  pain. Concern for acute appendicitis. EXAM: CT ABDOMEN AND PELVIS WITH CONTRAST TECHNIQUE: Multidetector CT imaging of the abdomen and pelvis was performed using the standard protocol following bolus administration of intravenous contrast. CONTRAST:  OMNIPAQUE IOHEXOL 300 MG/ML  SOLN COMPARISON:  Ultrasound dated 06/10/2016 FINDINGS: Lower chest: The visualized lung bases are clear. Hepatobiliary: No focal liver abnormality is seen. No gallstones, gallbladder wall thickening, or biliary dilatation. Pancreas: Unremarkable. No pancreatic ductal dilatation or surrounding inflammatory changes. Spleen: Normal in size without focal abnormality. Adrenals/Urinary Tract: Adrenal glands are unremarkable. Kidneys are normal, without renal calculi, focal lesion, or hydronephrosis. Bladder is unremarkable. Stomach/Bowel: There is no bowel dilatation or evidence of obstruction. Moderate stool noted throughout the colon. The appendix is enlarged and inflamed. There is an 11 mm stone in the midportion of the appendix. The appendix is located in the right lower quadrant inferior to the cecum and extending into the right hemipelvis along the right pelvic sidewall. The appendix measures approximately 12 mm in diameter. There is apparent focal area of discontinuity of the wall of the appendix (coronal series 6, image 51) suspicious for perforation. There is a small focus of air in the right hemipelvis adjacent to the sigmoid colon (coronal series 6, image 59) likely a focally contained perforation. There is a small amount of complex appearing fluid within the pelvis with enhancement of the adjacent peritoneum. Vascular/Lymphatic: No significant vascular findings are present. No enlarged abdominal or pelvic lymph nodes. Reproductive: Hysterectomy. Other: None Musculoskeletal: No acute or significant osseous findings. Grade 1 L4-L5 anterolisthesis. IMPRESSION: Acute appendicitis with findings of perforation. Small complex fluid  within the pelvis with peripheral enhancement is concerning for early abscess formation. These results were called by telephone at the time of interpretation on 08/26/2017 at 3:40 am to Dr. Dione Booze , who verbally acknowledged these results. Electronically Signed   By: Elgie Collard M.D.   On: 08/26/2017 03:42    Procedures Procedures  Medications Ordered in ED Medications  metroNIDAZOLE (  FLAGYL) IVPB 500 mg (has no administration in time range)  morphine 4 MG/ML injection 4 mg (has no administration in time range)  ciprofloxacin (CIPRO) IVPB 400 mg (has no administration in time range)  morphine 4 MG/ML injection 4 mg (4 mg Intravenous Given 08/26/17 0109)  ondansetron (ZOFRAN) injection 4 mg (4 mg Intravenous Given 08/26/17 0106)  sodium chloride 0.9 % bolus 1,000 mL (0 mLs Intravenous Stopped 08/26/17 0405)  iohexol (OMNIPAQUE) 300 MG/ML solution 100 mL (100 mLs Intravenous Contrast Given 08/26/17 0122)     Initial Impression / Assessment and Plan / ED Course  I have reviewed the triage vital signs and the nursing notes.  Pertinent labs & imaging results that were available during my care of the patient were reviewed by me and considered in my medical decision making (see chart for details).  Abdominal pain with nausea and vomiting and right lower quadrant tenderness.  This is very concerning for appendicitis.  Possible small bowel obstruction.  Also consider possibility of diverticulitis, pancreatitis.  Old records are reviewed, and she has no relevant past visits.  Screening labs are ordered, she will be given IV fluids, morphine, ondansetron and will be sent for CT of abdomen and pelvis.  CT scan shows acute appendicitis with perforation but no definite abscess formation.  She is started on antibiotics of ciprofloxacin and metronidazole.  Case is discussed with Dr.Byerly of surgery service who agrees to admit the patient.  Final Clinical Impressions(s) / ED Diagnoses   Final diagnoses:   Acute perforated appendicitis    ED Discharge Orders    None       Dione Booze, MD 08/26/17 [phone]

## 2017-08-26 NOTE — Transfer of Care (Signed)
Immediate Anesthesia Transfer of Care Note  Patient: Karen [name]  Procedure(s) Performed: APPENDECTOMY LAPAROSCOPIC (N/A Abdomen)  Patient Location: PACU  Anesthesia Type:General  Level of Consciousness: drowsy  Airway & Oxygen Therapy: Patient Spontanous Breathing and Patient connected to nasal cannula oxygen  Post-op Assessment: Report given to RN and Post -op Vital signs reviewed and stable  Post vital signs: Reviewed and stable  Last Vitals:  Vitals Value Taken Time  BP 101/43 08/26/2017 11:19 AM  Temp 37.7 C 08/26/2017 11:19 AM  Pulse 70 08/26/2017 11:23 AM  Resp 12 08/26/2017 11:23 AM  SpO2 100 % 08/26/2017 11:23 AM  Vitals shown include unvalidated device data.  Last Pain:  Vitals:   08/26/17 1119  TempSrc:   PainSc: (P) 0-No pain         Complications: No apparent anesthesia complications

## 2017-08-26 NOTE — Anesthesia Postprocedure Evaluation (Signed)
Anesthesia Post Note  Patient: Karen [name]  Procedure(s) Performed: APPENDECTOMY LAPAROSCOPIC (N/A Abdomen)     Patient location during evaluation: PACU Anesthesia Type: General Level of consciousness: awake Pain management: pain level controlled Vital Signs Assessment: post-procedure vital signs reviewed and stable Respiratory status: spontaneous breathing Cardiovascular status: stable Postop Assessment: no apparent nausea or vomiting Anesthetic complications: no    Last Vitals:  Vitals:   08/26/17 1229 08/26/17 1230  BP:    Pulse:  67  Resp:  15  Temp: 37.1 C   SpO2:  97%    Last Pain:  Vitals:   08/26/17 1229  TempSrc:   PainSc: 0-No pain   Pain Goal:                 Dekota Kirlin JR,JOHN Madeleine Fenn

## 2017-08-26 NOTE — ED Notes (Signed)
Per CT, scan is being read now. Will continue to monitor.

## 2017-08-26 NOTE — ED Notes (Signed)
ED Provider at bedside. 

## 2017-08-27 ENCOUNTER — Encounter (INDEPENDENT_AMBULATORY_CARE_PROVIDER_SITE_OTHER): Payer: Self-pay

## 2017-08-27 ENCOUNTER — Encounter (HOSPITAL_COMMUNITY): Payer: Self-pay | Admitting: General Surgery

## 2017-08-27 LAB — CBC
HCT: 28.8 % — ABNORMAL LOW (ref 36.0–46.0)
HEMATOCRIT: 30.1 % — AB (ref 36.0–46.0)
HEMOGLOBIN: 9.7 g/dL — AB (ref 12.0–15.0)
Hemoglobin: 9.4 g/dL — ABNORMAL LOW (ref 12.0–15.0)
MCH: 30.9 pg (ref 26.0–34.0)
MCH: 30.6 pg (ref 26.0–34.0)
MCHC: 32.6 g/dL (ref 30.0–36.0)
MCHC: 32.2 g/dL (ref 30.0–36.0)
MCV: 94.7 fL (ref 78.0–100.0)
MCV: 95 fL (ref 78.0–100.0)
Platelets: 129 K/uL — ABNORMAL LOW (ref 150–400)
Platelets: 141 10*3/uL — ABNORMAL LOW (ref 150–400)
RBC: 3.04 MIL/uL — ABNORMAL LOW (ref 3.87–5.11)
RBC: 3.17 MIL/uL — AB (ref 3.87–5.11)
RDW: 13.9 % (ref 11.5–15.5)
RDW: 13.6 % (ref 11.5–15.5)
WBC: 6.3 K/uL (ref 4.0–10.5)
WBC: 7.8 10*3/uL (ref 4.0–10.5)

## 2017-08-27 LAB — BASIC METABOLIC PANEL
ANION GAP: 5 (ref 5–15)
BUN: 19 mg/dL (ref 6–20)
CALCIUM: 7.2 mg/dL — AB (ref 8.9–10.3)
CO2: 22 mmol/L (ref 22–32)
Chloride: 104 mmol/L (ref 101–111)
Creatinine, Ser: 1.15 mg/dL — ABNORMAL HIGH (ref 0.44–1.00)
GFR, EST AFRICAN AMERICAN: 58 mL/min — AB (ref >60)
GFR, EST NON AFRICAN AMERICAN: 50 mL/min — AB (ref >60)
Glucose, Bld: 136 mg/dL — ABNORMAL HIGH (ref 65–99)
POTASSIUM: 4.1 mmol/L (ref 3.5–5.1)
SODIUM: 131 mmol/L — AB (ref 135–145)

## 2017-08-27 MED ORDER — SODIUM CHLORIDE 0.9 % IV BOLUS
500.0000 mL | Freq: Once | INTRAVENOUS | Status: AC
  Administered 2017-08-27: 500 mL via INTRAVENOUS

## 2017-08-27 MED ORDER — EST ESTROGENS-METHYLTEST 0.625-1.25 MG PO TABS: 1.0000 | ORAL_TABLET | Freq: Every day | ORAL | Status: DC

## 2017-08-27 MED ORDER — PHENOL 1.4 % MT LIQD
1.0000 | OROMUCOSAL | Status: DC | PRN
Start: 2017-08-27 — End: 2017-09-01
  Administered 2017-08-27: 1 via OROMUCOSAL
  Filled 2017-08-27: qty 177

## 2017-08-27 MED ORDER — ESTROGENS CONJUGATED 0.625 MG PO TABS
0.6250 mg | ORAL_TABLET | Freq: Every day | ORAL | Status: DC
  Filled 2017-08-27 (×2): qty 1

## 2017-08-27 MED ORDER — LORATADINE 10 MG PO TABS
10.0000 mg | ORAL_TABLET | Freq: Every day | ORAL | Status: DC
  Filled 2017-08-27: qty 1

## 2017-08-27 NOTE — Progress Notes (Signed)
Pt has ambulated half a lap on unit, tolerated well

## 2017-08-27 NOTE — Progress Notes (Signed)
[facility] Progress Note  1 Day Post-Op  Subjective: CC- RLQ pain Patient states that she is having some RLQ discomfort and abdominal bloating, but overall pain is well controlled. Denies n/v. No flatus or BM. States that she has tried to get up twice but got lightheaded. She required I&O cath over night, bladder scan this AM shows about 120cc urine. She has not yet been able to void since surgery.  Objective: Vital signs in last 24 hours: Temp:  [97.8 F (36.6 C)-99.8 F (37.7 C)] 98.5 F (36.9 C) (05/10 0547) Pulse Rate:  [45-74] 49 (05/10 0547) Resp:  [13-22] 16 (05/09 1245) BP: (80-103)/(43-57) 90/57 (05/10 0547) SpO2:  [94 %-100 %] 97 % (05/10 0547) Weight:  [94.2 kg (207 lb 10.8 oz)] 94.2 kg (207 lb 10.8 oz) (05/09 1245) Last BM Date: 08/25/17  Intake/Output from previous day: 05/09 0701 - 05/10 0700 In: 1552.5 [I.V.:1002.5; IV Piggyback:550] Out: 510 [Urine:500; Blood:10] Intake/Output this shift: No intake/output data recorded.  PE: Gen:  Alert, NAD, pleasant HEENT: EOM's intact, pupils equal and round Card:  RRR, no M/G/R heard Pulm:  CTAB, no W/R/R, effort normal Abd: Soft, mild distension, mild lower abdominal TTP, few BS heard, multiple lap incision with C/D/I dressings  Lab Results:  Recent Labs    08/26/17 0021 08/27/17 0523  WBC 14.3* 6.3  HGB 12.3 9.4*  HCT 37.1 28.8*  PLT 214 129*   BMET Recent Labs    08/26/17 0018 08/27/17 0523  NA 133* 131*  K 3.7 4.1  CL 100* 104  CO2 25 22  GLUCOSE 144* 136*  BUN 10 19  CREATININE 1.03* 1.15*  CALCIUM 8.6* 7.2*   PT/INR No results for input(s): LABPROT, INR in the last 72 hours. CMP     Component Value Date/Time   NA 131 (L) 08/27/2017 0523   K 4.1 08/27/2017 0523   CL 104 08/27/2017 0523   CO2 22 08/27/2017 0523   GLUCOSE 136 (H) 08/27/2017 0523   BUN 19 08/27/2017 0523   CREATININE 1.15 (H) 08/27/2017 0523   CREATININE 0.94 03/21/2015 0903   CALCIUM 7.2 (L) 08/27/2017 0523    PROT 6.4 (L) 08/26/2017 0018   ALBUMIN 3.8 08/26/2017 0018   AST 22 08/26/2017 0018   ALT 15 08/26/2017 0018   ALKPHOS 50 08/26/2017 0018   BILITOT 1.2 08/26/2017 0018   GFRNONAA 50 (L) 08/27/2017 0523   GFRNONAA 66 03/21/2015 0903   GFRAA 58 (L) 08/27/2017 0523   GFRAA 76 03/21/2015 0903   Lipase     Component Value Date/Time   LIPASE 25 08/26/2017 0018       Studies/Results: Ct Abdomen Pelvis W Contrast  Result Date: 08/26/2017 CLINICAL DATA:  62 year old female with abdominal pain. Concern for acute appendicitis. EXAM: CT ABDOMEN AND PELVIS WITH CONTRAST TECHNIQUE: Multidetector CT imaging of the abdomen and pelvis was performed using the standard protocol following bolus administration of intravenous contrast. CONTRAST:  OMNIPAQUE IOHEXOL 300 MG/ML  SOLN COMPARISON:  Ultrasound dated 06/10/2016 FINDINGS: Lower chest: The visualized lung bases are clear. Hepatobiliary: No focal liver abnormality is seen. No gallstones, gallbladder wall thickening, or biliary dilatation. Pancreas: Unremarkable. No pancreatic ductal dilatation or surrounding inflammatory changes. Spleen: Normal in size without focal abnormality. Adrenals/Urinary Tract: Adrenal glands are unremarkable. Kidneys are normal, without renal calculi, focal lesion, or hydronephrosis. Bladder is unremarkable. Stomach/Bowel: There is no bowel dilatation or evidence of obstruction. Moderate stool noted throughout the colon. The appendix is enlarged and inflamed. There  is an 11 mm stone in the midportion of the appendix. The appendix is located in the right lower quadrant inferior to the cecum and extending into the right hemipelvis along the right pelvic sidewall. The appendix measures approximately 12 mm in diameter. There is apparent focal area of discontinuity of the wall of the appendix (coronal series 6, image 51) suspicious for perforation. There is a small focus of air in the right hemipelvis adjacent to the sigmoid colon  (coronal series 6, image 59) likely a focally contained perforation. There is a small amount of complex appearing fluid within the pelvis with enhancement of the adjacent peritoneum. Vascular/Lymphatic: No significant vascular findings are present. No enlarged abdominal or pelvic lymph nodes. Reproductive: Hysterectomy. Other: None Musculoskeletal: No acute or significant osseous findings. Grade 1 L4-L5 anterolisthesis. IMPRESSION: Acute appendicitis with findings of perforation. Small complex fluid within the pelvis with peripheral enhancement is concerning for early abscess formation. These results were called by telephone at the time of interpretation on 08/26/2017 at 3:40 am to Dr. Dione Booze , who verbally acknowledged these results. Electronically Signed   By: Elgie Collard M.D.   On: 08/26/2017 03:42    Anti-infectives: Anti-infectives (From admission, onward)   Start     Dose/Rate Route Frequency Ordered Stop   08/26/17 1700  ciprofloxacin (CIPRO) IVPB 400 mg     400 mg 200 mL/hr over 60 Minutes Intravenous Every 12 hours 08/26/17 1304     08/26/17 1315  metroNIDAZOLE (FLAGYL) IVPB 500 mg     500 mg 100 mL/hr over 60 Minutes Intravenous Every 8 hours 08/26/17 1304     08/26/17 0415  ciprofloxacin (CIPRO) IVPB 400 mg     400 mg 200 mL/hr over 60 Minutes Intravenous  Once 08/26/17 0400 08/26/17 0723   08/26/17 0400  ceFEPIme (MAXIPIME) 1 g in sodium chloride 0.9 % 100 mL IVPB  Status:  Discontinued     1 g 200 mL/hr over 30 Minutes Intravenous  Once 08/26/17 0353 08/26/17 0400   08/26/17 0400  metroNIDAZOLE (FLAGYL) IVPB 500 mg     500 mg 100 mL/hr over 60 Minutes Intravenous  Once 08/26/17 0353 08/26/17 0645       Assessment/Plan Perforated appendicitis S/p laparoscopic appendectomy 5/9 Dr. Andrey Campanile - POD 1 - WBC 6.3, afebrile - no flatus or BM  ID - cipro/flagyl 5/9>> (needs 7 days total abx) FEN - IVF, CLD, colace VTE - SCDs, lovenox Foley - none Follow up - [facility]  clinic  Plan - Continue clear liquids and await bowel function. Increase IVF to 125cc/hr and give 500cc bolus due to hypotension, urinary retention, and AKI. Hold toradol. Encourage ambulation. Labs in AM.   LOS: 0 days    Franne Forts , [facility] 08/27/2017, 8:16 AM Pager: [phone] Consults: [phone] Mon-Fri 7:00 am-4:30 pm Sat-Sun 7:00 am-11:30 am

## 2017-08-27 NOTE — Progress Notes (Signed)
Pt has still not voided, bladder scanned with 167 cc noted. Pt still hypotensive despite having received a 500cc bolus and receiving iv fluids at 125cc/hr. PA paged as need to get pt up and moving but she says she feels dizzy

## 2017-08-27 NOTE — Progress Notes (Signed)
Pt has not been able to void on her own since surgery. Bladder scanned this morning with 130cc noted

## 2017-08-27 NOTE — Progress Notes (Signed)
MD text paged regarding urinary retention concerns. Pt also reports chills but no fever recorded

## 2017-08-27 NOTE — Progress Notes (Signed)
Pt.BP 83/55,HR 50 Dr.Cornnett is aware of it ordered 1L NS.Pt.is not able to urinate bladder scanned and urinate via in and  out cath.

## 2017-08-27 NOTE — Progress Notes (Signed)
Pt bladder scanned with 398cc noted on bladder scan. Pt still has no sensation to void but reports passing gas

## 2017-08-28 DIAGNOSIS — K567 Ileus, unspecified: Secondary | ICD-10-CM | POA: Diagnosis not present

## 2017-08-28 DIAGNOSIS — Z885 Allergy status to narcotic agent status: Secondary | ICD-10-CM | POA: Diagnosis not present

## 2017-08-28 DIAGNOSIS — I959 Hypotension, unspecified: Secondary | ICD-10-CM | POA: Diagnosis not present

## 2017-08-28 DIAGNOSIS — Z91018 Allergy to other foods: Secondary | ICD-10-CM | POA: Diagnosis not present

## 2017-08-28 DIAGNOSIS — Z880 Allergy status to penicillin: Secondary | ICD-10-CM | POA: Diagnosis not present

## 2017-08-28 DIAGNOSIS — Z888 Allergy status to other drugs, medicaments and biological substances status: Secondary | ICD-10-CM | POA: Diagnosis not present

## 2017-08-28 DIAGNOSIS — Z90710 Acquired absence of both cervix and uterus: Secondary | ICD-10-CM | POA: Diagnosis not present

## 2017-08-28 DIAGNOSIS — Z8249 Family history of ischemic heart disease and other diseases of the circulatory system: Secondary | ICD-10-CM | POA: Diagnosis not present

## 2017-08-28 DIAGNOSIS — N179 Acute kidney failure, unspecified: Secondary | ICD-10-CM | POA: Diagnosis not present

## 2017-08-28 DIAGNOSIS — K3532 Acute appendicitis with perforation and localized peritonitis, without abscess: Secondary | ICD-10-CM | POA: Diagnosis present

## 2017-08-28 DIAGNOSIS — Z811 Family history of alcohol abuse and dependence: Secondary | ICD-10-CM | POA: Diagnosis not present

## 2017-08-28 DIAGNOSIS — R339 Retention of urine, unspecified: Secondary | ICD-10-CM | POA: Diagnosis not present

## 2017-08-28 DIAGNOSIS — Z791 Long term (current) use of non-steroidal anti-inflammatories (NSAID): Secondary | ICD-10-CM | POA: Diagnosis not present

## 2017-08-28 DIAGNOSIS — Z7951 Long term (current) use of inhaled steroids: Secondary | ICD-10-CM | POA: Diagnosis not present

## 2017-08-28 DIAGNOSIS — Z8349 Family history of other endocrine, nutritional and metabolic diseases: Secondary | ICD-10-CM | POA: Diagnosis not present

## 2017-08-28 LAB — CBC
HEMATOCRIT: 29.1 % — AB (ref 36.0–46.0)
HEMOGLOBIN: 9.5 g/dL — AB (ref 12.0–15.0)
MCH: 30.7 pg (ref 26.0–34.0)
MCHC: 32.6 g/dL (ref 30.0–36.0)
MCV: 94.2 fL (ref 78.0–100.0)
Platelets: 137 10*3/uL — ABNORMAL LOW (ref 150–400)
RBC: 3.09 MIL/uL — ABNORMAL LOW (ref 3.87–5.11)
RDW: 13.7 % (ref 11.5–15.5)
WBC: 6.2 10*3/uL (ref 4.0–10.5)

## 2017-08-28 LAB — BASIC METABOLIC PANEL
Anion gap: 4 — ABNORMAL LOW (ref 5–15)
BUN: 11 mg/dL (ref 6–20)
CHLORIDE: 108 mmol/L (ref 101–111)
CO2: 19 mmol/L — AB (ref 22–32)
CREATININE: 1 mg/dL (ref 0.44–1.00)
Calcium: 7 mg/dL — ABNORMAL LOW (ref 8.9–10.3)
GFR calc Af Amer: >60 mL/min (ref >60)
GFR calc non Af Amer: 59 mL/min — ABNORMAL LOW (ref >60)
Glucose, Bld: 145 mg/dL — ABNORMAL HIGH (ref 65–99)
POTASSIUM: 4.2 mmol/L (ref 3.5–5.1)
Sodium: 131 mmol/L — ABNORMAL LOW (ref 135–145)

## 2017-08-28 MED ORDER — METOCLOPRAMIDE HCL 5 MG/ML IJ SOLN
10.0000 mg | Freq: Four times a day (QID) | INTRAMUSCULAR | Status: DC
  Administered 2017-08-28 – 2017-08-30 (×8): 10 mg via INTRAVENOUS
  Filled 2017-08-28 (×8): qty 2

## 2017-08-28 MED ORDER — BETHANECHOL CHLORIDE 5 MG PO TABS
5.0000 mg | ORAL_TABLET | Freq: Three times a day (TID) | ORAL | Status: DC
  Administered 2017-08-28 – 2017-08-30 (×6): 5 mg via ORAL
  Filled 2017-08-28 (×9): qty 1

## 2017-08-28 NOTE — Progress Notes (Signed)
Obtained gas rocking chair for pt.

## 2017-08-28 NOTE — Progress Notes (Signed)
2 Days Post-Op   Subjective/Chief Complaint: Patient with some urinary retention - required replacement of Foley Passing some flatus, but feels nauseated and bloated No BM yet Pain - minimal   Objective: Vital signs in last 24 hours: Temp:  [97.9 F (36.6 C)-98.5 F (36.9 C)] 98.5 F (36.9 C) (05/11 0545) Pulse Rate:  [53-70] 62 (05/11 0545) Resp:  [14-20] 15 (05/11 0545) BP: (88-109)/(58-68) 109/68 (05/11 0545) SpO2:  [92 %-99 %] 92 % (05/11 0545) Last BM Date: 08/24/17  Intake/Output from previous day: 05/10 0701 - 05/11 0700 In: 650 [I.V.:500; IV Piggyback:150] Out: 1150 [Urine:1150] Intake/Output this shift: No intake/output data recorded.  General appearance: alert, cooperative and mild distress Resp: clear to auscultation bilaterally Cardio: regular rate and rhythm, S1, S2 normal, no murmur, click, rub or gallop GI: soft, mildly distended; minimal incisional tenderness Dressings removed - incisions c/d/i with steri-strips  Lab Results:  Recent Labs    08/27/17 1346 08/28/17 0456  WBC 7.8 6.2  HGB 9.7* 9.5*  HCT 30.1* 29.1*  PLT 141* 137*   BMET Recent Labs    08/27/17 0523 08/28/17 0456  NA 131* 131*  K 4.1 4.2  CL 104 108  CO2 22 19*  GLUCOSE 136* 145*  BUN 19 11  CREATININE 1.15* 1.00  CALCIUM 7.2* 7.0*   PT/INR No results for input(s): LABPROT, INR in the last 72 hours. ABG No results for input(s): PHART, HCO3 in the last 72 hours.  Invalid input(s): PCO2, PO2  Studies/Results: No results found.  Anti-infectives: Anti-infectives (From admission, onward)   Start     Dose/Rate Route Frequency Ordered Stop   08/26/17 1700  ciprofloxacin (CIPRO) IVPB 400 mg     400 mg 200 mL/hr over 60 Minutes Intravenous Every 12 hours 08/26/17 1304     08/26/17 1315  metroNIDAZOLE (FLAGYL) IVPB 500 mg     500 mg 100 mL/hr over 60 Minutes Intravenous Every 8 hours 08/26/17 1304     08/26/17 0415  ciprofloxacin (CIPRO) IVPB 400 mg     400 mg 200  mL/hr over 60 Minutes Intravenous  Once 08/26/17 0400 08/26/17 0723   08/26/17 0400  ceFEPIme (MAXIPIME) 1 g in sodium chloride 0.9 % 100 mL IVPB  Status:  Discontinued     1 g 200 mL/hr over 30 Minutes Intravenous  Once 08/26/17 0353 08/26/17 0400   08/26/17 0400  metroNIDAZOLE (FLAGYL) IVPB 500 mg     500 mg 100 mL/hr over 60 Minutes Intravenous  Once 08/26/17 0353 08/26/17 0645      Assessment/Plan: Perforated appendicitis S/p laparoscopic appendectomy 5/9 Dr. Andrey Campanile - POD 2 - WBC 6.2, afebrile - mild post-op ileus - add Reglan for 24 hours -urinary retention - Urecholine; leave Foley today  ID - cipro/flagyl 5/9>> (needs 7 days total abx) FEN - IVF, CLD, colace VTE - SCDs, lovenox Foley - none Follow up - [facility]  Plan - Continue clear liquids and await bowel function. Increase IVF to 125cc/hr  hypotension, urinary retention, and AKI. Hold toradol. Encourage ambulation.   LOS: 0 days    Wynona Luna 08/28/2017

## 2017-08-29 MED ORDER — IBUPROFEN 400 MG PO TABS
400.0000 mg | ORAL_TABLET | Freq: Four times a day (QID) | ORAL | Status: DC | PRN
Start: 2017-08-29 — End: 2017-09-01

## 2017-08-29 MED ORDER — ENSURE ENLIVE PO LIQD
237.0000 mL | Freq: Two times a day (BID) | ORAL | Status: DC
  Administered 2017-08-29: 237 mL via ORAL

## 2017-08-29 MED ORDER — FAMOTIDINE 20 MG PO TABS
20.0000 mg | ORAL_TABLET | Freq: Two times a day (BID) | ORAL | Status: DC
  Administered 2017-08-29 – 2017-09-01 (×6): 20 mg via ORAL
  Filled 2017-08-29 (×6): qty 1

## 2017-08-29 NOTE — Progress Notes (Signed)
Pt ambulated a full lap on unit this pm. Reports passing gas but no bowel movement

## 2017-08-29 NOTE — Progress Notes (Signed)
Pt ambulated two laps.

## 2017-08-29 NOTE — Progress Notes (Signed)
Progress Note: General Surgery Service   Assessment/Plan: Patient Active Problem List   Diagnosis Date Noted  . Acute perforated appendicitis 08/26/2017  . Perforated appendicitis 08/26/2017  . Upper airway cough syndrome 10/16/2016  . Asthmatic bronchitis 07/31/2016  . Microhematuria 06/04/2016  . Well adult exam 05/27/2016  . Paresthesia 05/27/2016  . Tinea pedis 05/27/2016  . Laryngitis 03/24/2016  . Pain in joint, shoulder region 03/24/2016  . Chest pain 01/01/2016  . Allergic rhinitis 08/06/2015  . Allergic conjunctivitis 08/06/2015  . Grief 08/06/2015  . Anaphylactic reaction 05/02/2015  . Screen for colon cancer 05/02/2015  . Muscle spasms of neck 10/14/2014  . Left groin pain 01/26/2012  . Foot cramps 01/26/2012  . Pneumonia, organism unspecified(486) 08/04/2011  . Healthcare maintenance 11/23/2010  . Arthritis   . Lumbar back pain   . Finger pain   . Surgical menopause    s/p Procedure(s): APPENDECTOMY LAPAROSCOPIC 08/26/2017 Perforated appendicitis with ileus. +flatus overnight, still nauseated -full liquids as able -ambulate -dc foley -continue cipro/flagyl for 7 days total (started 5/9    LOS: 1 day  Chief Complaint/Subjective: Still nauseated, no appetite, +flatus, no BM, feels bloated  Objective: Vital signs in last 24 hours: Temp:  [98.7 F (37.1 C)-99.3 F (37.4 C)] 98.7 F (37.1 C) (05/12 0453) Pulse Rate:  [62-66] 64 (05/12 0453) Resp:  [16-18] 18 (05/12 0453) BP: (118-122)/(69-80) 118/80 (05/12 0453) SpO2:  [95 %-96 %] 95 % (05/12 0453) Last BM Date: 08/25/17  Intake/Output from previous day: 05/11 0701 - 05/12 0700 In: 120 [P.O.:120] Out: 4650 [Urine:4650] Intake/Output this shift: No intake/output data recorded.  Lungs: CTAB  Cardiovascular: RRR  Abd: soft, ATTP, incisions c/d/i ecchymosis at umbilical area, mild distension  Extremities: no edema  Neuro: AOX4  Lab Results: CBC  Recent Labs    08/27/17 1346  08/28/17 0456  WBC 7.8 6.2  HGB 9.7* 9.5*  HCT 30.1* 29.1*  PLT 141* 137*   BMET Recent Labs    08/27/17 0523 08/28/17 0456  NA 131* 131*  K 4.1 4.2  CL 104 108  CO2 22 19*  GLUCOSE 136* 145*  BUN 19 11  CREATININE 1.15* 1.00  CALCIUM 7.2* 7.0*   PT/INR No results for input(s): LABPROT, INR in the last 72 hours. ABG No results for input(s): PHART, HCO3 in the last 72 hours.  Invalid input(s): PCO2, PO2  Studies/Results:  Anti-infectives: Anti-infectives (From admission, onward)   Start     Dose/Rate Route Frequency Ordered Stop   08/26/17 1700  ciprofloxacin (CIPRO) IVPB 400 mg     400 mg 200 mL/hr over 60 Minutes Intravenous Every 12 hours 08/26/17 1304     08/26/17 1315  metroNIDAZOLE (FLAGYL) IVPB 500 mg     500 mg 100 mL/hr over 60 Minutes Intravenous Every 8 hours 08/26/17 1304     08/26/17 0415  ciprofloxacin (CIPRO) IVPB 400 mg     400 mg 200 mL/hr over 60 Minutes Intravenous  Once 08/26/17 0400 08/26/17 0723   08/26/17 0400  ceFEPIme (MAXIPIME) 1 g in sodium chloride 0.9 % 100 mL IVPB  Status:  Discontinued     1 g 200 mL/hr over 30 Minutes Intravenous  Once 08/26/17 0353 08/26/17 0400   08/26/17 0400  metroNIDAZOLE (FLAGYL) IVPB 500 mg     500 mg 100 mL/hr over 60 Minutes Intravenous  Once 08/26/17 0353 08/26/17 0645      Medications: Scheduled Meds: . acetaminophen  1,000 mg Oral Q6H  . bethanechol  5  mg Oral TID  . docusate sodium  100 mg Oral BID  . enoxaparin (LOVENOX) injection  40 mg Subcutaneous Q24H  . gabapentin  300 mg Oral BID  . metoCLOPramide (REGLAN) injection  10 mg Intravenous Q6H   Continuous Infusions: . ciprofloxacin 400 mg (08/29/17 0555)  . dextrose 5 % and 0.45 % NaCl with KCl 20 mEq/L 125 mL/hr at 08/29/17 0600  . famotidine (PEPCID) IV Stopped (08/28/17 2351)  . metronidazole 500 mg (08/29/17 0602)   PRN Meds:.diazepam, diphenhydrAMINE **OR** diphenhydrAMINE, fluticasone, olopatadine, ondansetron **OR** ondansetron  (ZOFRAN) IV, phenol, promethazine  Rodman Pickle, MD Pg# [phone] [facility]

## 2017-08-29 NOTE — Progress Notes (Signed)
Pt ambulated another 300 feet on unit this evening before c/o nausea and returning back to her room. Overall, pt is ambulating well with walker

## 2017-08-30 LAB — BASIC METABOLIC PANEL
ANION GAP: 5 (ref 5–15)
BUN: <5 mg/dL — ABNORMAL LOW (ref 6–20)
CHLORIDE: 108 mmol/L (ref 101–111)
CO2: 22 mmol/L (ref 22–32)
Calcium: 8 mg/dL — ABNORMAL LOW (ref 8.9–10.3)
Creatinine, Ser: 0.86 mg/dL (ref 0.44–1.00)
Glucose, Bld: 124 mg/dL — ABNORMAL HIGH (ref 65–99)
POTASSIUM: 3.8 mmol/L (ref 3.5–5.1)
SODIUM: 135 mmol/L (ref 135–145)

## 2017-08-30 LAB — CBC
HEMATOCRIT: 34 % — AB (ref 36.0–46.0)
Hemoglobin: 11.4 g/dL — ABNORMAL LOW (ref 12.0–15.0)
MCH: 31.6 pg (ref 26.0–34.0)
MCHC: 33.5 g/dL (ref 30.0–36.0)
MCV: 94.2 fL (ref 78.0–100.0)
PLATELETS: 207 10*3/uL (ref 150–400)
RBC: 3.61 MIL/uL — AB (ref 3.87–5.11)
RDW: 13.6 % (ref 11.5–15.5)
WBC: 6 10*3/uL (ref 4.0–10.5)

## 2017-08-30 MED ORDER — POLYETHYLENE GLYCOL 3350 17 G PO PACK
17.0000 g | PACK | Freq: Every day | ORAL | Status: DC
  Administered 2017-08-30 – 2017-09-01 (×3): 17 g via ORAL
  Filled 2017-08-30 (×3): qty 1

## 2017-08-30 MED ORDER — GABAPENTIN 300 MG PO CAPS
300.0000 mg | ORAL_CAPSULE | Freq: Every day | ORAL | Status: DC
  Administered 2017-08-30: 300 mg via ORAL
  Filled 2017-08-30: qty 1

## 2017-08-30 MED ORDER — GABAPENTIN 300 MG PO CAPS: 300.0000 mg | ORAL_CAPSULE | Freq: Every day | ORAL | Status: DC

## 2017-08-30 NOTE — Progress Notes (Addendum)
[facility] Progress Note  4 Days Post-Op  Subjective: CC- headache Main complaint this morning and is headache/fogginess. States that is has been pretty constant over the weekend, no better or worse today. Reglan seems to be helping with some of her nausea, but she does state that she still gets a little nauseated and dizzy with ambulation. No emesis. Prior to admission patient states that she has severe motion sickness in cars, planes, and sometimes just looking at the computer. She denies any abdominal pain. She does still feel bloated but started passing more gas over night. No BM. She only drank cranberry juice for dinner. No appetite.  Objective: Vital signs in last 24 hours: Temp:  [98.1 F (36.7 C)-98.8 F (37.1 C)] 98.1 F (36.7 C) (05/13 0555) Pulse Rate:  [55-63] 63 (05/13 0555) Resp:  [15-16] 15 (05/13 0555) BP: (95-129)/(62-81) 129/81 (05/13 0555) SpO2:  [92 %-96 %] 92 % (05/13 0555) Last BM Date: 08/25/17  Intake/Output from previous day: 05/12 0701 - 05/13 0700 In: 180 [P.O.:180] Out: 2100 [Urine:2100] Intake/Output this shift: Total I/O In: -  Out: 1625 [Urine:1625]  PE: Gen:  Alert, NAD, pleasant HEENT: EOM's intact, pupils equal and round Card:  RRR, no M/G/R heard Pulm:  CTAB, no W/R/R, effort normal Abd: Soft, mild distension, mild lower abdominal TTP, few BS heard, multiple lap incision  C/D/I  Lab Results:  Recent Labs    08/27/17 1346 08/28/17 0456  WBC 7.8 6.2  HGB 9.7* 9.5*  HCT 30.1* 29.1*  PLT 141* 137*   BMET Recent Labs    08/28/17 0456  NA 131*  K 4.2  CL 108  CO2 19*  GLUCOSE 145*  BUN 11  CREATININE 1.00  CALCIUM 7.0*   PT/INR No results for input(s): LABPROT, INR in the last 72 hours. CMP     Component Value Date/Time   NA 131 (L) 08/28/2017 0456   K 4.2 08/28/2017 0456   CL 108 08/28/2017 0456   CO2 19 (L) 08/28/2017 0456   GLUCOSE 145 (H) 08/28/2017 0456   BUN 11 08/28/2017 0456   CREATININE 1.00  08/28/2017 0456   CREATININE 0.94 03/21/2015 0903   CALCIUM 7.0 (L) 08/28/2017 0456   PROT 6.4 (L) 08/26/2017 0018   ALBUMIN 3.8 08/26/2017 0018   AST 22 08/26/2017 0018   ALT 15 08/26/2017 0018   ALKPHOS 50 08/26/2017 0018   BILITOT 1.2 08/26/2017 0018   GFRNONAA 59 (L) 08/28/2017 0456   GFRNONAA 66 03/21/2015 0903   GFRAA >60 08/28/2017 0456   GFRAA 76 03/21/2015 0903   Lipase     Component Value Date/Time   LIPASE 25 08/26/2017 0018       Studies/Results: No results found.  Anti-infectives: Anti-infectives (From admission, onward)   Start     Dose/Rate Route Frequency Ordered Stop   08/26/17 1700  ciprofloxacin (CIPRO) IVPB 400 mg     400 mg 200 mL/hr over 60 Minutes Intravenous Every 12 hours 08/26/17 1304     08/26/17 1315  metroNIDAZOLE (FLAGYL) IVPB 500 mg     500 mg 100 mL/hr over 60 Minutes Intravenous Every 8 hours 08/26/17 1304     08/26/17 0415  ciprofloxacin (CIPRO) IVPB 400 mg     400 mg 200 mL/hr over 60 Minutes Intravenous  Once 08/26/17 0400 08/26/17 0723   08/26/17 0400  ceFEPIme (MAXIPIME) 1 g in sodium chloride 0.9 % 100 mL IVPB  Status:  Discontinued     1 g 200 mL/hr  over 30 Minutes Intravenous  Once 08/26/17 0353 08/26/17 0400   08/26/17 0400  metroNIDAZOLE (FLAGYL) IVPB 500 mg     500 mg 100 mL/hr over 60 Minutes Intravenous  Once 08/26/17 0353 08/26/17 0645       Assessment/Plan Perforated appendicitis S/p laparoscopic appendectomy 5/9 Dr. Andrey Campanile - POD 4 - VSS - passing gas, no BM  ID - cipro/flagyl 5/9>> (needs 7 days total abx) FEN - IVF, FLD, miralax/colace VTE - SCDs, lovenox Foley - replaced 5/11 for urinary retention, voiding trial today Follow up - [facility]  Plan - Check CBC/BMP. D/c gabapentin, this may be causing headache/fogginess. D/c reglan. Continue ambulating. Continue sips of full liquids as tolerated. Add miralax to bowel regimen. Voiding trial today.   LOS: 2 days    Franne Forts , [facility]  Surgery 08/30/2017, 8:47 AM Pager: [phone] Consults: [phone] Mon-Fri 7:00 am-4:30 pm Sat-Sun 7:00 am-11:30 am

## 2017-08-31 ENCOUNTER — Inpatient Hospital Stay (HOSPITAL_COMMUNITY): Payer: No Typology Code available for payment source

## 2017-08-31 MED ORDER — BISACODYL 10 MG RE SUPP
10.0000 mg | Freq: Once | RECTAL | Status: AC
  Administered 2017-08-31: 10 mg via RECTAL
  Filled 2017-08-31: qty 1

## 2017-08-31 MED ORDER — DIMENHYDRINATE 50 MG PO TABS
50.0000 mg | ORAL_TABLET | Freq: Every evening | ORAL | Status: DC | PRN
  Filled 2017-08-31: qty 1

## 2017-08-31 NOTE — Progress Notes (Addendum)
[facility] Progress Note  5 Days Post-Op  Subjective: CC- nausea Patient states that she had the worst night she's had since admission. She was very nauseated and vomited once this morning. No longer passing flatus. She had a small BM yesterday. Denies increased bloating. States that she feels like she needs to have BM. Foley removed yesterday, voiding without issues.  Objective: Vital signs in last 24 hours: Temp:  [98 F (36.7 C)-98.6 F (37 C)] 98 F (36.7 C) (05/14 0536) Pulse Rate:  [58-68] 68 (05/14 0536) Resp:  [16] 16 (05/14 0536) BP: (114-138)/(67-86) 118/73 (05/14 0536) SpO2:  [96 %-98 %] 98 % (05/14 0536) Last BM Date: 08/30/17  Intake/Output from previous day: 05/13 0701 - 05/14 0700 In: 11249.6 [P.O.:160; I.V.:9489.6; IV Piggyback:1600] Out: 3550 [Urine:3550] Intake/Output this shift: No intake/output data recorded.  PE: Gen: Alert, NAD, pleasant HEENT: EOM's intact, pupils equal and round Card: RRR, no M/G/R heard Pulm: CTAB, no W/R/R, effort normal Abd: Soft,mild distension, mild lower abdominal TTP, +BS,multiple lapincisionsC/D/I with steris intact  Lab Results:  Recent Labs    08/30/17 0902  WBC 6.0  HGB 11.4*  HCT 34.0*  PLT 207   BMET Recent Labs    08/30/17 0902  NA 135  K 3.8  CL 108  CO2 22  GLUCOSE 124*  BUN <5*  CREATININE 0.86  CALCIUM 8.0*   PT/INR No results for input(s): LABPROT, INR in the last 72 hours. CMP     Component Value Date/Time   NA 135 08/30/2017 0902   K 3.8 08/30/2017 0902   CL 108 08/30/2017 0902   CO2 22 08/30/2017 0902   GLUCOSE 124 (H) 08/30/2017 0902   BUN <5 (L) 08/30/2017 0902   CREATININE 0.86 08/30/2017 0902   CREATININE 0.94 03/21/2015 0903   CALCIUM 8.0 (L) 08/30/2017 0902   PROT 6.4 (L) 08/26/2017 0018   ALBUMIN 3.8 08/26/2017 0018   AST 22 08/26/2017 0018   ALT 15 08/26/2017 0018   ALKPHOS 50 08/26/2017 0018   BILITOT 1.2 08/26/2017 0018   GFRNONAA >60 08/30/2017  0902   GFRNONAA 66 03/21/2015 0903   GFRAA >60 08/30/2017 0902   GFRAA 76 03/21/2015 0903   Lipase     Component Value Date/Time   LIPASE 25 08/26/2017 0018       Studies/Results: No results found.  Anti-infectives: Anti-infectives (From admission, onward)   Start     Dose/Rate Route Frequency Ordered Stop   08/26/17 1700  ciprofloxacin (CIPRO) IVPB 400 mg     400 mg 200 mL/hr over 60 Minutes Intravenous Every 12 hours 08/26/17 1304     08/26/17 1315  metroNIDAZOLE (FLAGYL) IVPB 500 mg     500 mg 100 mL/hr over 60 Minutes Intravenous Every 8 hours 08/26/17 1304     08/26/17 0415  ciprofloxacin (CIPRO) IVPB 400 mg     400 mg 200 mL/hr over 60 Minutes Intravenous  Once 08/26/17 0400 08/26/17 0723   08/26/17 0400  ceFEPIme (MAXIPIME) 1 g in sodium chloride 0.9 % 100 mL IVPB  Status:  Discontinued     1 g 200 mL/hr over 30 Minutes Intravenous  Once 08/26/17 0353 08/26/17 0400   08/26/17 0400  metroNIDAZOLE (FLAGYL) IVPB 500 mg     500 mg 100 mL/hr over 60 Minutes Intravenous  Once 08/26/17 0353 08/26/17 0645       Assessment/Plan Perforated appendicitis S/p laparoscopic appendectomy 5/9 Dr. Andrey Campanile -POD 5 - VSS - WBC WNL 5/13  ID -cipro/flagyl  5/9>> (day 6/7) FEN -IVF, regular diet, miralax/colace VTE -SCDs, lovenox Foley -d/c 5/13 Follow up -[facility]  Plan- Check abdominal xray. Dulcolax suppository today. Continue ambulating. Diet as tolerated. If nausea improves patient will be ready for discharge.   LOS: 3 days    Franne Forts , [facility] 08/31/2017, 7:50 AM Pager: [phone] Consults: [phone] Mon-Fri 7:00 am-4:30 pm Sat-Sun 7:00 am-11:30 am

## 2017-08-31 NOTE — Progress Notes (Addendum)
1530 Bedside shift report. Pt resting in chair. C/O constipation, asking for another suppository. Stated she had a small BM but still feels urge to go more. Pt stated she is walking hallways and drinking fluids. Advised pt that RN with read MD note and updated with POC.   1600 Pt assessed, still c/o constipation, asked RN to page MD. Karen Bjornstad, PA paged. Awaiting callback. Pt with positive bowel sounds in all 4 quads, hypoactive in LLQ, all others active. Pt denies pain, SOB, surgical incisions x3 intact, mild ecchymosis to naval incision noted.   1730 Pt stated she ate 1 saltine and couple bites of applesauce. MD paged x2 awaiting call back. No other needs at this time.   1845 Pt resting in chair, no complaints, updated with POC, awaiting night shift RN for report.

## 2017-09-01 MED ORDER — DOCUSATE SODIUM 100 MG PO CAPS: 100.0000 mg | ORAL_CAPSULE | Freq: Two times a day (BID) | ORAL | 0 refills | Status: AC

## 2017-09-01 MED ORDER — CIPROFLOXACIN HCL 500 MG PO TABS: 500.0000 mg | ORAL_TABLET | Freq: Two times a day (BID) | ORAL | 0 refills | Status: AC

## 2017-09-01 MED ORDER — POLYETHYLENE GLYCOL 3350 17 G PO PACK: 17.0000 g | PACK | Freq: Every day | ORAL | 0 refills | Status: AC | PRN

## 2017-09-01 MED ORDER — METRONIDAZOLE 500 MG PO TABS: 500.0000 mg | ORAL_TABLET | Freq: Three times a day (TID) | ORAL | 0 refills | Status: AC

## 2017-09-01 MED ORDER — ONDANSETRON 4 MG PO TBDP: 4.0000 mg | ORAL_TABLET | Freq: Four times a day (QID) | ORAL | 0 refills | Status: DC | PRN

## 2017-09-01 NOTE — Progress Notes (Signed)
Discharge home. Home discharge instruction given, no question verbalized. 

## 2017-09-01 NOTE — Discharge Summary (Signed)
[facility] Discharge Summary   Patient ID: Karen [name] MRN: [id number] DOB/AGE: 10-18-1955 62 y.o.  Admit date: 08/25/2017 Discharge date: 09/01/2017  Admitting Diagnosis: Acute appendicitis  Discharge Diagnosis Patient Active Problem List   Diagnosis Date Noted  . Acute perforated appendicitis 08/26/2017  . Perforated appendicitis 08/26/2017  . Upper airway cough syndrome 10/16/2016  . Asthmatic bronchitis 07/31/2016  . Microhematuria 06/04/2016  . Well adult exam 05/27/2016  . Paresthesia 05/27/2016  . Tinea pedis 05/27/2016  . Laryngitis 03/24/2016  . Pain in joint, shoulder region 03/24/2016  . Chest pain 01/01/2016  . Allergic rhinitis 08/06/2015  . Allergic conjunctivitis 08/06/2015  . Grief 08/06/2015  . Anaphylactic reaction 05/02/2015  . Screen for colon cancer 05/02/2015  . Muscle spasms of neck 10/14/2014  . Left groin pain 01/26/2012  . Foot cramps 01/26/2012  . Pneumonia, organism unspecified(486) 08/04/2011  . Healthcare maintenance 11/23/2010  . Arthritis   . Lumbar back pain   . Finger pain   . Surgical menopause     Consultants None  Imaging: Dg Abd Portable 1v  Result Date: 08/31/2017 CLINICAL DATA:  Ileus. EXAM: PORTABLE ABDOMEN - 1 VIEW COMPARISON:  CT abdomen pelvis dated Aug 26, 2017. FINDINGS: The bowel gas pattern is normal. No radio-opaque calculi or other significant radiographic abnormality are seen. No acute osseous abnormality. IMPRESSION: Negative. Electronically Signed   By: Obie Dredge M.D.   On: 08/31/2017 09:38    Procedures Dr. Andrey Campanile (08/26/17) - Laparoscopic Appendectomy  Hospital Course:  Karen [name] is a 62yo female who presented to [facility] 5/9 with 2 days of worsening abdominal pain and nausea.  Workup showed perforated appendicitis.  Patient was admitted and underwent procedure listed above.  Tolerated procedure well and was transferred to the floor.  She did have an ileus postoperatively as expected, this  improved with time. She also had issues with persistent nausea after surgery, this improved but did not fully resolve prior to discharge. Labs were followed and were unrevealing. Abdominal xray showed no acute issues. Diet was advanced as tolerated.  On POD6 the patient was voiding well, tolerating small amount of diet, ambulating well, pain well controlled, vital signs stable, incisions c/d/i and felt stable for discharge home. She will be sent home with 1 day of cipro/flagyl to complete a 7 day course. Patient will follow up as below and knows to call with questions or concerns.     Physical Exam: Gen: Alert, NAD, pleasant HEENT: EOM's intact, pupils equal and round Card: RRR, no M/G/R heard Pulm: CTAB, no W/R/R, effort normal Abd: Soft,mild distension, nontender, +BS,multiple lapincisionsC/D/I with steris intact   Allergies as of 09/01/2017      Reactions   Penicillins Anaphylaxis   anaphylactic reaction   Hydrocodone    nausea   Singulair [montelukast Sodium]    numbness   Tape    Adhesive reaction      Medication List    STOP taking these medications   ondansetron 4 MG tablet Commonly known as:  ZOFRAN     TAKE these medications   ciprofloxacin 500 MG tablet Commonly known as:  CIPRO Take 1 tablet (500 mg total) by mouth 2 (two) times daily for 1 day.   cyclobenzaprine 5 MG tablet Commonly known as:  FLEXERIL Take 1 tablet (5 mg total) by mouth 3 (three) times daily as needed for muscle spasms.   diazepam 2 MG tablet Commonly known as:  VALIUM Take 1 tablet (2 mg total) by mouth every  8 (eight) hours as needed for anxiety.   diphenhydrAMINE 50 MG tablet Commonly known as:  BENADRYL Take 1 tablet (50 mg total) by mouth every 6 (six) hours as needed for itching or allergies.   docusate sodium 100 MG capsule Commonly known as:  COLACE Take 1 capsule (100 mg total) by mouth 2 (two) times daily.   EPINEPHrine 0.3 mg/0.3 mL Devi Commonly known as:   EPI-PEN Inject 0.3 mg into the muscle daily as needed (anaphylaxis).   erythromycin ophthalmic ointment Place 1 application into both eyes at bedtime. What changed:    when to take this  reasons to take this   estrogen-methylTESTOSTERone 0.625-1.25 MG tablet Take 1 tablet by mouth daily.   fexofenadine 180 MG tablet Commonly known as:  ALLEGRA Take 1 tablet (180 mg total) by mouth daily.   fluticasone 50 MCG/ACT nasal spray Commonly known as:  FLONASE Place 2 sprays into both nostrils daily. What changed:    when to take this  reasons to take this   meloxicam 15 MG tablet Commonly known as:  MOBIC Take 1 tablet (15 mg total) by mouth daily as needed. What changed:  reasons to take this   metroNIDAZOLE 500 MG tablet Commonly known as:  FLAGYL Take 1 tablet (500 mg total) by mouth 3 (three) times daily for 1 day.   multivitamin with minerals Tabs tablet Take 1 tablet by mouth daily.   olopatadine 0.1 % ophthalmic solution Commonly known as:  PATANOL Place 1 drop into both eyes daily as needed for allergies.   ondansetron 4 MG disintegrating tablet Commonly known as:  ZOFRAN-ODT Take 1 tablet (4 mg total) by mouth every 6 (six) hours as needed for nausea.   polyethylene glycol packet Commonly known as:  MIRALAX / GLYCOLAX Take 17 g by mouth daily as needed for moderate constipation.   pseudoephedrine 60 MG tablet Commonly known as:  SUDAFED Take 1 tablet (60 mg total) by mouth every 4 (four) hours as needed (allergic reaction).   VIACTIV PO Take 1 tablet by mouth 2 (two) times daily.   vitamin C 500 MG tablet Commonly known as:  ASCORBIC ACID Take 500 mg by mouth daily.        Follow-up Information    [facility], [place]. Go on 09/09/2017.   Specialty:  General Surgery Why:  Your appointment is 05/23 at 2:30 pm. Please arrive 30 minutes prior to your appointment to check in and fill out paperwork. Bring photo ID and insurance  information. Contact information: [address] [place] [address] [phone]          Signed: Franne Forts, [facility] 09/01/2017, 8:45 AM Pager: [phone] Consults: [phone] Mon-Fri 7:00 am-4:30 pm Sat-Sun 7:00 am-11:30 am

## 2017-09-05 ENCOUNTER — Encounter: Payer: Self-pay | Admitting: Internal Medicine

## 2018-01-04 ENCOUNTER — Ambulatory Visit: Payer: No Typology Code available for payment source | Admitting: Family

## 2018-01-04 ENCOUNTER — Encounter: Payer: Self-pay | Admitting: Family

## 2018-01-04 VITALS — BP 108/68 | HR 52 | Temp 98.0°F | Ht 61.0 in | Wt 112.0 lb

## 2018-01-04 DIAGNOSIS — L03031 Cellulitis of right toe: Secondary | ICD-10-CM | POA: Diagnosis not present

## 2018-01-04 MED ORDER — SULFAMETHOXAZOLE-TRIMETHOPRIM 800-160 MG PO TABS: 1.0000 | ORAL_TABLET | Freq: Two times a day (BID) | ORAL | 0 refills | Status: DC

## 2018-01-04 NOTE — Patient Instructions (Signed)
Paronychia  Paronychia is an infection of the skin. It happens near a fingernail or toenail. It may cause pain and swelling around the nail. Usually, it is not serious and it clears up with treatment.  Follow these instructions at home:   Soak the fingers or toes in warm water as told by your doctor. You may be told to do this for 20 minutes, 2-3 times a day.   Keep the area dry when you are not soaking it.   Take medicines only as told by your doctor.   If you were given an antibiotic medicine, finish all of it even if you start to feel better.   Keep the affected area clean.   Do not try to drain a fluid-filled bump yourself.   Wear rubber gloves when putting your hands in water.   Wear gloves if your hands might touch cleaners or chemicals.   Follow your doctor's instructions about:  ? Wound care.  ? Bandage (dressing) changes and removal.  Contact a doctor if:   Your symptoms get worse or do not improve.   You have a fever or chills.   You have redness spreading from the affected area.   You have more fluid, blood, or pus coming from the affected area.   Your finger or knuckle is swollen or is hard to move.  This information is not intended to replace advice given to you by your health care provider. Make sure you discuss any questions you have with your health care provider.  Document Released: 03/25/2009 Document Revised: 09/12/2015 Document Reviewed: 03/14/2014  Elsevier Interactive Patient Education  2018 [employer]

## 2018-01-04 NOTE — Progress Notes (Signed)
Karen [name] is a 62 y.o. female with the following history as recorded in EpicCare:  Patient Active Problem List   Diagnosis Date Noted  . Acute perforated appendicitis 08/26/2017  . Perforated appendicitis 08/26/2017  . Upper airway cough syndrome 10/16/2016  . Asthmatic bronchitis 07/31/2016  . Microhematuria 06/04/2016  . Well adult exam 05/27/2016  . Paresthesia 05/27/2016  . Tinea pedis 05/27/2016  . Laryngitis 03/24/2016  . Pain in joint, shoulder region 03/24/2016  . Chest pain 01/01/2016  . Allergic rhinitis 08/06/2015  . Allergic conjunctivitis 08/06/2015  . Grief 08/06/2015  . Anaphylactic reaction 05/02/2015  . Screen for colon cancer 05/02/2015  . Muscle spasms of neck 10/14/2014  . Left groin pain 01/26/2012  . Foot cramps 01/26/2012  . Pneumonia, organism unspecified(486) 08/04/2011  . Healthcare maintenance 11/23/2010  . Arthritis   . Lumbar back pain   . Finger pain   . Surgical menopause     Current Outpatient Medications  Medication Sig Dispense Refill  . Calcium-Vitamin D-Vitamin K (VIACTIV PO) Take 1 tablet by mouth 2 (two) times daily.    . cyclobenzaprine (FLEXERIL) 5 MG tablet Take 1 tablet (5 mg total) by mouth 3 (three) times daily as needed for muscle spasms. 90 tablet 1  . diazepam (VALIUM) 2 MG tablet Take 1 tablet (2 mg total) by mouth every 8 (eight) hours as needed for anxiety. 60 tablet 1  . diphenhydrAMINE (BENADRYL) 50 MG tablet Take 1 tablet (50 mg total) by mouth every 6 (six) hours as needed for itching or allergies. 30 tablet 1  . docusate sodium (COLACE) 100 MG capsule Take 1 capsule (100 mg total) by mouth 2 (two) times daily. 10 capsule 0  . EPINEPHrine (EPI-PEN) 0.3 mg/0.3 mL DEVI Inject 0.3 mg into the muscle daily as needed (anaphylaxis).     Marland Kitchen erythromycin ophthalmic ointment Place 1 application into both eyes at bedtime. (Patient taking differently: Place 1 application into both eyes daily as needed (conjunctivitis). ) 3.5 g 0  .  estrogen-methylTESTOSTERone 0.625-1.25 MG per tablet Take 1 tablet by mouth daily. 90 tablet 3  . fexofenadine (ALLEGRA) 180 MG tablet Take 1 tablet (180 mg total) by mouth daily. 100 tablet 3  . fluticasone (FLONASE) 50 MCG/ACT nasal spray Place 2 sprays into both nostrils daily. (Patient taking differently: Place 2 sprays into both nostrils daily as needed for allergies. ) 16 g 6  . meloxicam (MOBIC) 15 MG tablet Take 1 tablet (15 mg total) by mouth daily as needed. (Patient taking differently: Take 15 mg by mouth daily as needed for pain. ) 90 tablet 3  . Multiple Vitamin (MULTIVITAMIN WITH MINERALS) TABS tablet Take 1 tablet by mouth daily.    Marland Kitchen olopatadine (PATANOL) 0.1 % ophthalmic solution Place 1 drop into both eyes daily as needed for allergies.     Marland Kitchen ondansetron (ZOFRAN-ODT) 4 MG disintegrating tablet Take 1 tablet (4 mg total) by mouth every 6 (six) hours as needed for nausea. 15 tablet 0  . polyethylene glycol (MIRALAX / GLYCOLAX) packet Take 17 g by mouth daily as needed for moderate constipation. 14 each 0  . pseudoephedrine (SUDAFED) 60 MG tablet Take 1 tablet (60 mg total) by mouth every 4 (four) hours as needed (allergic reaction). 60 tablet 1  . sulfamethoxazole-trimethoprim (BACTRIM DS,SEPTRA DS) 800-160 MG tablet Take 1 tablet by mouth 2 (two) times daily. 10 tablet 0  . vitamin C (ASCORBIC ACID) 500 MG tablet Take 500 mg by mouth daily.  No current facility-administered medications for this visit.     Allergies: Penicillins; Hydrocodone; Singulair [montelukast sodium]; and Tape  Past Medical History:  Diagnosis Date  . Arthritis    right  . Climacteric    on HRT  . Complication of anesthesia   . Epiglottitis 1959   tracheotomy  . Finger pain    pain with pressure on the PIP joint with hemorrhage and swelling. MRI hand inconclusive   . History of varicella   . Lumbar back pain 1999    initial strain. Intermittent pain since. Re-injured Dec '11. Had PT IN THE PAST.   Marland Kitchen Plantar fasciitis   . Pneumonia 1993   hospitalized  . PONV (postoperative nausea and vomiting)     Past Surgical History:  Procedure Laterality Date  . ABDOMINAL HYSTERECTOMY     endometriosis  . APPENDECTOMY  08/26/2017  . ARTHROSCOPIC REPAIR ACL  1995   right-reconstructed with patellar tendon  . FOOT SURGERY  1999   left foot-arch ligament release (metatarsal release ?)  . LAPAROSCOPIC APPENDECTOMY N/A 08/26/2017   Procedure: APPENDECTOMY LAPAROSCOPIC;  Surgeon: Gaynelle Adu, MD;  Location: [facility] OR;  Service: General;  Laterality: N/A;  . MENISCUS REPAIR  1975   right medial/ open  . REDUCTION MAMMAPLASTY Bilateral 1976  . reduction mammoplasty  1975  . TOTAL ABDOMINAL HYSTERECTOMY W/ BILATERAL SALPINGOOPHORECTOMY  '02    Family History  Problem Relation Age of Onset  . Hypertension Mother   . Heart disease Father        CABG  . Hyperlipidemia Father   . Hypertension Father   . Obesity Father   . Gout Father   . Alcohol abuse Father   . HIV Brother   . Cancer Neg Hx   . Diabetes Neg Hx     Social History   Tobacco Use  . Smoking status: Never Smoker  . Smokeless tobacco: Never Used  Substance Use Topics  . Alcohol use: No    Subjective:  Patient presents with concern for infection of 2nd toe right foot; two different traumatic injuries in the past week; notes that base of toe is swollen/ looks full of infection/ red; denies any fever; + pain; very active person and wants to make sure she can continue to be active.   Objective:  Vitals:   01/04/18 0911  BP: 108/68  Pulse: (!) 52  Temp: 98 F (36.7 C)  TempSrc: Oral  SpO2: 98%  Weight: 112 lb (50.8 kg)  Height: 5\' 1"  (1.549 m)    General: Well developed, well nourished, in no acute distress  Skin : Warm and dry. Pus filled at base of 2nd toe right foot- small puncture made and able to drain infection with relief Head: Normocephalic and atraumatic  Lungs: Respirations unlabored; Extremities: No edema,  cyanosis, clubbing  Vessels: Symmetric bilaterally  Neurologic: Alert and oriented; speech intact; face symmetrical; moves all extremities well; CNII-XII intact without focal deficit   Assessment:  1. Paronychia of second toe, right     Plan:  Rx for Bactrim DS bid x 5 days; apply neosporin, keep covered; discussed benefit of Epsom Salt soaks; follow-up worse, no better.   Plan for flu shot in about 3 weeks; return for CPE with her PCP in February 2020;  No follow-ups on file.  No orders of the defined types were placed in this encounter.   Requested Prescriptions   Signed Prescriptions Disp Refills  . sulfamethoxazole-trimethoprim (BACTRIM DS,SEPTRA DS) 800-160 MG tablet 10  tablet 0    Sig: Take 1 tablet by mouth 2 (two) times daily.

## 2018-01-28 ENCOUNTER — Ambulatory Visit: Payer: No Typology Code available for payment source | Admitting: Internal Medicine

## 2018-01-28 ENCOUNTER — Encounter: Payer: Self-pay | Admitting: Internal Medicine

## 2018-01-28 ENCOUNTER — Other Ambulatory Visit (INDEPENDENT_AMBULATORY_CARE_PROVIDER_SITE_OTHER): Payer: No Typology Code available for payment source

## 2018-01-28 VITALS — BP 100/60 | HR 48 | Temp 97.9°F | Ht 61.0 in | Wt 114.0 lb

## 2018-01-28 DIAGNOSIS — Z23 Encounter for immunization: Secondary | ICD-10-CM

## 2018-01-28 DIAGNOSIS — M79676 Pain in unspecified toe(s): Secondary | ICD-10-CM | POA: Insufficient documentation

## 2018-01-28 DIAGNOSIS — M79675 Pain in left toe(s): Secondary | ICD-10-CM | POA: Diagnosis not present

## 2018-01-28 DIAGNOSIS — R5383 Other fatigue: Secondary | ICD-10-CM

## 2018-01-28 LAB — COMPREHENSIVE METABOLIC PANEL
ALBUMIN: 4 g/dL (ref 3.5–5.2)
ALK PHOS: 38 U/L — AB (ref 39–117)
ALT: 20 U/L (ref 0–35)
AST: 24 U/L (ref 0–37)
BILIRUBIN TOTAL: 0.4 mg/dL (ref 0.2–1.2)
BUN: 24 mg/dL — AB (ref 6–23)
CALCIUM: 9.1 mg/dL (ref 8.4–10.5)
CO2: 30 mEq/L (ref 19–32)
Chloride: 100 mEq/L (ref 96–112)
Creatinine, Ser: 1.23 mg/dL — ABNORMAL HIGH (ref 0.40–1.20)
GFR: 46.82 mL/min — ABNORMAL LOW (ref >60.00)
Glucose, Bld: 96 mg/dL (ref 70–99)
Potassium: 4.1 mEq/L (ref 3.5–5.1)
SODIUM: 134 meq/L — AB (ref 135–145)
TOTAL PROTEIN: 6.5 g/dL (ref 6.0–8.3)

## 2018-01-28 LAB — CBC
HCT: 35.2 % — ABNORMAL LOW (ref 36.0–46.0)
Hemoglobin: 11.9 g/dL — ABNORMAL LOW (ref 12.0–15.0)
MCHC: 33.7 g/dL (ref 30.0–36.0)
MCV: 96 fl (ref 78.0–100.0)
PLATELETS: 190 10*3/uL (ref 150.0–400.0)
RBC: 3.66 Mil/uL — AB (ref 3.87–5.11)
RDW: 15 % (ref 11.5–15.5)
WBC: 3.8 10*3/uL — AB (ref 4.0–10.5)

## 2018-01-28 LAB — TSH: TSH: 1.38 u[IU]/mL (ref 0.35–4.50)

## 2018-01-28 LAB — FERRITIN: FERRITIN: 55.4 ng/mL (ref 10.0–291.0)

## 2018-01-28 NOTE — Patient Instructions (Signed)
We will check the labs today. 

## 2018-01-28 NOTE — Assessment & Plan Note (Signed)
No infection on exam. Unable to examine nail well due to polish. Advised that nail will likely either fall off or be pushed off due to new nail growth and it could take up to 1 year for this to happen. Reassurance given.

## 2018-01-28 NOTE — Assessment & Plan Note (Signed)
Checking CBC, CMP, thyroid and advise as able. She may just be still recovering from surgery. Counseled extensively about possible causes.

## 2018-01-28 NOTE — Progress Notes (Signed)
   Subjective:    Patient ID: Karen [name], female    DOB: [birth year], 62 y.o.   MRN: [id number]  HPI The patient is a 62 YO female coming in for concern about toe infection. She was seen about 2-3 weeks ago for possible infection and given course of bactrim which she finished. She had some improvement of the foot and the pain stopped. She still has some color change by the nail bed. She did not realize that the nail was not attached until yesterday when she went for pedicure. They did not mess with that nail but just painted it. She is walking and playing tennis without problems.  Other concern is fatigue. Since surgery for appendix in May she has been feeling poorly and not usual. This was gradually improving but since the infection she is back down to not feeling as well. Needs to nap for an hour in the afternoon. She is sleeping well and wakes rested. Denies depression or anxiety. She does have a lot of concerns about her health. Denies weight loss and eating well. Denies diarrhea or constipation. Denies blood in stool.   Review of Systems  Constitutional: Positive for fatigue.  HENT: Negative.   Eyes: Negative.   Respiratory: Negative for cough, chest tightness and shortness of breath.   Cardiovascular: Negative for chest pain, palpitations and leg swelling.  Gastrointestinal: Negative for abdominal distention, abdominal pain, constipation, diarrhea, nausea and vomiting.  Musculoskeletal: Negative.   Skin: Negative.        Nail problem  Neurological: Negative.   Psychiatric/Behavioral: Negative.       Objective:   Physical Exam  Constitutional: She is oriented to person, place, and time. She appears well-developed and well-nourished.  HENT:  Head: Normocephalic and atraumatic.  Eyes: EOM are normal.  Neck: Normal range of motion.  Cardiovascular: Normal rate and regular rhythm.  Pulmonary/Chest: Effort normal and breath sounds normal. No respiratory distress. She has no wheezes. She  has no rales.  Abdominal: Soft. Bowel sounds are normal. She exhibits no distension. There is no tenderness. There is no rebound.  Musculoskeletal: She exhibits no edema.  Neurological: She is alert and oriented to person, place, and time. Coordination normal.  Skin: Skin is warm and dry.  Left foot toe with red rim around the nail bed, no purulent drainage expressible or abscess appreciated. Nail unable to be examined well due to red color from polish.   Psychiatric: She has a normal mood and affect.   Vitals:   01/28/18 1403  BP: 100/60  Pulse: (!) 48  Temp: 97.9 F (36.6 C)  TempSrc: Oral  SpO2: 99%  Weight: 114 lb (51.7 kg)  Height: 5\' 1"  (1.549 m)  Flu shot given at visit    Assessment & Plan:  Visit time 25 minutes: greater than 50% of that time was spent in face to face counseling and coordination of care with the patient: counseled about possible etiology of fatigue as well as the status of her foot and likely course of recovery there as well as signs to look for problems. The patient had numerous questions which were addressed as able.

## 2018-02-14 ENCOUNTER — Other Ambulatory Visit (INDEPENDENT_AMBULATORY_CARE_PROVIDER_SITE_OTHER): Payer: No Typology Code available for payment source

## 2018-02-14 ENCOUNTER — Encounter: Payer: Self-pay | Admitting: Internal Medicine

## 2018-02-14 ENCOUNTER — Ambulatory Visit: Payer: No Typology Code available for payment source | Admitting: Internal Medicine

## 2018-02-14 ENCOUNTER — Encounter

## 2018-02-14 VITALS — BP 98/60 | HR 52 | Temp 97.6°F | Ht 61.0 in | Wt 115.0 lb

## 2018-02-14 DIAGNOSIS — R5383 Other fatigue: Secondary | ICD-10-CM | POA: Diagnosis not present

## 2018-02-14 DIAGNOSIS — R7989 Other specified abnormal findings of blood chemistry: Secondary | ICD-10-CM | POA: Insufficient documentation

## 2018-02-14 LAB — URINALYSIS, ROUTINE W REFLEX MICROSCOPIC
Bilirubin Urine: NEGATIVE
Hgb urine dipstick: NEGATIVE
Ketones, ur: NEGATIVE
Nitrite: NEGATIVE
PH: 7.5 (ref 5.0–8.0)
RBC / HPF: NONE SEEN (ref 0–?)
SPECIFIC GRAVITY, URINE: 1.015 (ref 1.000–1.030)
Total Protein, Urine: NEGATIVE
Urine Glucose: NEGATIVE
Urobilinogen, UA: 0.2 (ref 0.0–1.0)

## 2018-02-14 LAB — CBC WITH DIFFERENTIAL/PLATELET
BASOS ABS: 0.1 10*3/uL (ref 0.0–0.1)
Basophils Relative: 2.1 % (ref 0.0–3.0)
Eosinophils Absolute: 0.1 10*3/uL (ref 0.0–0.7)
Eosinophils Relative: 1.5 % (ref 0.0–5.0)
HCT: 37.9 % (ref 36.0–46.0)
Hemoglobin: 13 g/dL (ref 12.0–15.0)
LYMPHS ABS: 1.4 10*3/uL (ref 0.7–4.0)
Lymphocytes Relative: 34.4 % (ref 12.0–46.0)
MCHC: 34.2 g/dL (ref 30.0–36.0)
MCV: 96.6 fl (ref 78.0–100.0)
MONOS PCT: 9.1 % (ref 3.0–12.0)
Monocytes Absolute: 0.4 10*3/uL (ref 0.1–1.0)
NEUTROS ABS: 2.2 10*3/uL (ref 1.4–7.7)
NEUTROS PCT: 52.9 % (ref 43.0–77.0)
PLATELETS: 180 10*3/uL (ref 150.0–400.0)
RBC: 3.92 Mil/uL (ref 3.87–5.11)
RDW: 14.6 % (ref 11.5–15.5)
WBC: 4.2 10*3/uL (ref 4.0–10.5)

## 2018-02-14 LAB — VITAMIN B12: Vitamin B-12: 491 pg/mL (ref 211–911)

## 2018-02-14 LAB — BASIC METABOLIC PANEL
BUN: 17 mg/dL (ref 6–23)
CALCIUM: 9.3 mg/dL (ref 8.4–10.5)
CO2: 30 meq/L (ref 19–32)
CREATININE: 1.06 mg/dL (ref 0.40–1.20)
Chloride: 98 mEq/L (ref 96–112)
GFR: 55.58 mL/min — ABNORMAL LOW (ref >60.00)
GLUCOSE: 103 mg/dL — AB (ref 70–99)
Potassium: 4.3 mEq/L (ref 3.5–5.1)
Sodium: 132 mEq/L — ABNORMAL LOW (ref 135–145)

## 2018-02-14 LAB — CORTISOL: Cortisol, Plasma: 4.4 ug/dL

## 2018-02-14 LAB — IRON,TIBC AND FERRITIN PANEL
%SAT: 33 % (calc) (ref 16–45)
FERRITIN: 61 ng/mL (ref 16–288)
Iron: 96 ug/dL (ref 45–160)
TIBC: 293 mcg/dL (calc) (ref 250–450)

## 2018-02-14 LAB — SEDIMENTATION RATE: Sed Rate: 1 mm/hr (ref 0–30)

## 2018-02-14 LAB — HEPATIC FUNCTION PANEL
ALK PHOS: 52 U/L (ref 39–117)
ALT: 39 U/L — ABNORMAL HIGH (ref 0–35)
AST: 37 U/L (ref 0–37)
Albumin: 4.4 g/dL (ref 3.5–5.2)
BILIRUBIN DIRECT: 0.1 mg/dL (ref 0.0–0.3)
BILIRUBIN TOTAL: 0.6 mg/dL (ref 0.2–1.2)
Total Protein: 6.8 g/dL (ref 6.0–8.3)

## 2018-02-14 NOTE — Progress Notes (Signed)
Subjective:  Patient ID: Karen [name], female    DOB: [birth year]  Age: 62 y.o. MRN: [id number]  CC: No chief complaint on file.   HPI Karen [name] presents for fatigue, low BP, wt loss 15 lbs since May 2019 BMs are nl q 3 d C/o fatigue F/u elev creatinine    Outpatient Medications Prior to Visit  Medication Sig Dispense Refill  . Calcium-Vitamin D-Vitamin K (VIACTIV PO) Take 1 tablet by mouth 2 (two) times daily.    . cyclobenzaprine (FLEXERIL) 5 MG tablet Take 1 tablet (5 mg total) by mouth 3 (three) times daily as needed for muscle spasms. 90 tablet 1  . diazepam (VALIUM) 2 MG tablet Take 1 tablet (2 mg total) by mouth every 8 (eight) hours as needed for anxiety. 60 tablet 1  . diphenhydrAMINE (BENADRYL) 50 MG tablet Take 1 tablet (50 mg total) by mouth every 6 (six) hours as needed for itching or allergies. 30 tablet 1  . docusate sodium (COLACE) 100 MG capsule Take 1 capsule (100 mg total) by mouth 2 (two) times daily. 10 capsule 0  . EPINEPHrine (EPI-PEN) 0.3 mg/0.3 mL DEVI Inject 0.3 mg into the muscle daily as needed (anaphylaxis).     Marland Kitchen erythromycin ophthalmic ointment Place 1 application into both eyes at bedtime. (Patient taking differently: Place 1 application into both eyes daily as needed (conjunctivitis). ) 3.5 g 0  . estrogen-methylTESTOSTERone 0.625-1.25 MG per tablet Take 1 tablet by mouth daily. 90 tablet 3  . fexofenadine (ALLEGRA) 180 MG tablet Take 1 tablet (180 mg total) by mouth daily. 100 tablet 3  . fluticasone (FLONASE) 50 MCG/ACT nasal spray Place 2 sprays into both nostrils daily. (Patient taking differently: Place 2 sprays into both nostrils daily as needed for allergies. ) 16 g 6  . meloxicam (MOBIC) 15 MG tablet Take 1 tablet (15 mg total) by mouth daily as needed. (Patient taking differently: Take 15 mg by mouth daily as needed for pain. ) 90 tablet 3  . Multiple Vitamin (MULTIVITAMIN WITH MINERALS) TABS tablet Take 1 tablet by mouth daily.    Marland Kitchen olopatadine  (PATANOL) 0.1 % ophthalmic solution Place 1 drop into both eyes daily as needed for allergies.     Marland Kitchen ondansetron (ZOFRAN-ODT) 4 MG disintegrating tablet Take 1 tablet (4 mg total) by mouth every 6 (six) hours as needed for nausea. 15 tablet 0  . polyethylene glycol (MIRALAX / GLYCOLAX) packet Take 17 g by mouth daily as needed for moderate constipation. 14 each 0  . pseudoephedrine (SUDAFED) 60 MG tablet Take 1 tablet (60 mg total) by mouth every 4 (four) hours as needed (allergic reaction). 60 tablet 1  . vitamin C (ASCORBIC ACID) 500 MG tablet Take 500 mg by mouth daily.       No facility-administered medications prior to visit.     ROS: Review of Systems  Constitutional: Positive for fatigue and unexpected weight change. Negative for activity change, appetite change and chills.  HENT: Negative for congestion, mouth sores and sinus pressure.   Eyes: Negative for visual disturbance.  Respiratory: Negative for cough and chest tightness.   Gastrointestinal: Negative for abdominal pain and nausea.  Genitourinary: Negative for difficulty urinating, frequency and vaginal pain.  Musculoskeletal: Negative for back pain and gait problem.  Skin: Negative for pallor and rash.  Neurological: Negative for dizziness, tremors, weakness, numbness and headaches.  Psychiatric/Behavioral: Negative for confusion, sleep disturbance and suicidal ideas. The patient is nervous/anxious.     Objective:  BP 98/60 (BP Location: Left Arm, Patient Position: Sitting, Cuff Size: Normal)   Pulse (!) 52   Temp 97.6 F (36.4 C) (Oral)   Ht 5\' 1"  (1.549 m)   Wt 115 lb (52.2 kg)   SpO2 98%   BMI 21.73 kg/m   BP Readings from Last 3 Encounters:  02/14/18 98/60  01/28/18 100/60  01/04/18 108/68    Wt Readings from Last 3 Encounters:  02/14/18 115 lb (52.2 kg)  01/28/18 114 lb (51.7 kg)  01/04/18 112 lb (50.8 kg)    Physical Exam  Constitutional: She appears well-developed. No distress.  HENT:  Head:  Normocephalic.  Right Ear: External ear normal.  Left Ear: External ear normal.  Nose: Nose normal.  Mouth/Throat: Oropharynx is clear and moist.  Eyes: Pupils are equal, round, and reactive to light. Conjunctivae are normal. Right eye exhibits no discharge. Left eye exhibits no discharge.  Neck: Normal range of motion. Neck supple. No JVD present. No tracheal deviation present. No thyromegaly present.  Cardiovascular: Normal rate, regular rhythm and normal heart sounds.  Pulmonary/Chest: No stridor. No respiratory distress. She has no wheezes.  Abdominal: Soft. Bowel sounds are normal. She exhibits no distension and no mass. There is no tenderness. There is no rebound and no guarding.  Musculoskeletal: She exhibits no edema or tenderness.  Lymphadenopathy:    She has no cervical adenopathy.  Neurological: She displays normal reflexes. No cranial nerve deficit. She exhibits normal muscle tone. Coordination normal.  Skin: No rash noted. No erythema.  Psychiatric: She has a normal mood and affect. Her behavior is normal. Judgment and thought content normal.   FTF>25 min discussing fatige, abn creatinine   Lab Results  Component Value Date   WBC 3.8 (L) 01/28/2018   HGB 11.9 (L) 01/28/2018   HCT 35.2 (L) 01/28/2018   PLT 190.0 01/28/2018   GLUCOSE 96 01/28/2018   CHOL 201 (H) 05/28/2017   TRIG 34.0 05/28/2017   HDL 70.00 05/28/2017   LDLDIRECT 134.0 01/25/2012   LDLCALC 124 (H) 05/28/2017   ALT 20 01/28/2018   AST 24 01/28/2018   NA 134 (L) 01/28/2018   K 4.1 01/28/2018   CL 100 01/28/2018   CREATININE 1.23 (H) 01/28/2018   BUN 24 (H) 01/28/2018   CO2 30 01/28/2018   TSH 1.38 01/28/2018   INR 0.97 08/01/2011    Ct Abdomen Pelvis W Contrast  Result Date: 08/26/2017 CLINICAL DATA:  62 year old female with abdominal pain. Concern for acute appendicitis. EXAM: CT ABDOMEN AND PELVIS WITH CONTRAST TECHNIQUE: Multidetector CT imaging of the abdomen and pelvis was performed using  the standard protocol following bolus administration of intravenous contrast. CONTRAST:  OMNIPAQUE IOHEXOL 300 MG/ML  SOLN COMPARISON:  Ultrasound dated 06/10/2016 FINDINGS: Lower chest: The visualized lung bases are clear. Hepatobiliary: No focal liver abnormality is seen. No gallstones, gallbladder wall thickening, or biliary dilatation. Pancreas: Unremarkable. No pancreatic ductal dilatation or surrounding inflammatory changes. Spleen: Normal in size without focal abnormality. Adrenals/Urinary Tract: Adrenal glands are unremarkable. Kidneys are normal, without renal calculi, focal lesion, or hydronephrosis. Bladder is unremarkable. Stomach/Bowel: There is no bowel dilatation or evidence of obstruction. Moderate stool noted throughout the colon. The appendix is enlarged and inflamed. There is an 11 mm stone in the midportion of the appendix. The appendix is located in the right lower quadrant inferior to the cecum and extending into the right hemipelvis along the right pelvic sidewall. The appendix measures approximately 12 mm in diameter. There is apparent  focal area of discontinuity of the wall of the appendix (coronal series 6, image 51) suspicious for perforation. There is a small focus of air in the right hemipelvis adjacent to the sigmoid colon (coronal series 6, image 59) likely a focally contained perforation. There is a small amount of complex appearing fluid within the pelvis with enhancement of the adjacent peritoneum. Vascular/Lymphatic: No significant vascular findings are present. No enlarged abdominal or pelvic lymph nodes. Reproductive: Hysterectomy. Other: None Musculoskeletal: No acute or significant osseous findings. Grade 1 L4-L5 anterolisthesis. IMPRESSION: Acute appendicitis with findings of perforation. Small complex fluid within the pelvis with peripheral enhancement is concerning for early abscess formation. These results were called by telephone at the time of interpretation on  08/26/2017 at 3:40 am to Dr. Dione Booze , who verbally acknowledged these results. Electronically Signed   By: Elgie Collard M.D.   On: 08/26/2017 03:42    Assessment & Plan:   There are no diagnoses linked to this encounter.   No orders of the defined types were placed in this encounter.    Follow-up: No follow-ups on file.  Sonda Primes, MD

## 2018-02-14 NOTE — Assessment & Plan Note (Signed)
New Post-CT contrast vs other Labs May need the [place]

## 2018-02-14 NOTE — Assessment & Plan Note (Signed)
Post-op fatigue Labs

## 2018-02-15 ENCOUNTER — Other Ambulatory Visit: Payer: Self-pay | Admitting: Internal Medicine

## 2018-02-15 ENCOUNTER — Other Ambulatory Visit: Payer: No Typology Code available for payment source

## 2018-02-15 DIAGNOSIS — N390 Urinary tract infection, site not specified: Secondary | ICD-10-CM

## 2018-02-15 MED ORDER — CIPROFLOXACIN HCL 250 MG PO TABS: 250.0000 mg | ORAL_TABLET | Freq: Two times a day (BID) | ORAL | 0 refills | Status: DC

## 2018-02-17 LAB — CULTURE, URINE COMPREHENSIVE
MICRO NUMBER:: 91299900
RESULT:: NO GROWTH
SPECIMEN QUALITY:: ADEQUATE

## 2018-03-14 ENCOUNTER — Ambulatory Visit: Payer: No Typology Code available for payment source | Admitting: Internal Medicine

## 2018-03-29 ENCOUNTER — Encounter: Payer: Self-pay | Admitting: Internal Medicine

## 2018-03-29 ENCOUNTER — Ambulatory Visit: Payer: No Typology Code available for payment source | Admitting: Internal Medicine

## 2018-03-29 VITALS — BP 108/66 | HR 47 | Temp 97.5°F | Ht 61.0 in | Wt 118.0 lb

## 2018-03-29 DIAGNOSIS — R945 Abnormal results of liver function studies: Secondary | ICD-10-CM

## 2018-03-29 DIAGNOSIS — R7989 Other specified abnormal findings of blood chemistry: Secondary | ICD-10-CM | POA: Insufficient documentation

## 2018-03-29 DIAGNOSIS — R5383 Other fatigue: Secondary | ICD-10-CM

## 2018-03-29 DIAGNOSIS — N3090 Cystitis, unspecified without hematuria: Secondary | ICD-10-CM | POA: Diagnosis not present

## 2018-03-29 NOTE — Patient Instructions (Signed)

## 2018-03-29 NOTE — Assessment & Plan Note (Signed)
Repeat UA 

## 2018-03-29 NOTE — Assessment & Plan Note (Signed)
Mild Abd CT was nl 2019

## 2018-03-29 NOTE — Assessment & Plan Note (Addendum)
Better Taking naps Repeat labs Mild Abd CT was nl 2019 CT Ca scoring test was offered

## 2018-03-29 NOTE — Progress Notes (Signed)
Subjective:  Patient ID: Karen [name], female    DOB: 10/21/1955  Age: 62 y.o. MRN: [id number]  CC: No chief complaint on file.   HPI Karen RaWendy Wolfman presents for wt loss, fatigue -better, CRI - mild, UTI f/u  Outpatient Medications Prior to Visit  Medication Sig Dispense Refill  . Calcium-Vitamin D-Vitamin K (VIACTIV PO) Take 1 tablet by mouth 2 (two) times daily.    . ciprofloxacin (CIPRO) 250 MG tablet Take 1 tablet (250 mg total) by mouth 2 (two) times daily. 10 tablet 0  . cyclobenzaprine (FLEXERIL) 5 MG tablet Take 1 tablet (5 mg total) by mouth 3 (three) times daily as needed for muscle spasms. 90 tablet 1  . diazepam (VALIUM) 2 MG tablet Take 1 tablet (2 mg total) by mouth every 8 (eight) hours as needed for anxiety. 60 tablet 1  . diphenhydrAMINE (BENADRYL) 50 MG tablet Take 1 tablet (50 mg total) by mouth every 6 (six) hours as needed for itching or allergies. 30 tablet 1  . docusate sodium (COLACE) 100 MG capsule Take 1 capsule (100 mg total) by mouth 2 (two) times daily. 10 capsule 0  . EPINEPHrine (EPI-PEN) 0.3 mg/0.3 mL DEVI Inject 0.3 mg into the muscle daily as needed (anaphylaxis).     Marland Kitchen. erythromycin ophthalmic ointment Place 1 application into both eyes at bedtime. (Patient taking differently: Place 1 application into both eyes daily as needed (conjunctivitis). ) 3.5 g 0  . estrogen-methylTESTOSTERone 0.625-1.25 MG per tablet Take 1 tablet by mouth daily. 90 tablet 3  . fexofenadine (ALLEGRA) 180 MG tablet Take 1 tablet (180 mg total) by mouth daily. 100 tablet 3  . fluticasone (FLONASE) 50 MCG/ACT nasal spray Place 2 sprays into both nostrils daily. (Patient taking differently: Place 2 sprays into both nostrils daily as needed for allergies. ) 16 g 6  . Multiple Vitamin (MULTIVITAMIN WITH MINERALS) TABS tablet Take 1 tablet by mouth daily.    Marland Kitchen. olopatadine (PATANOL) 0.1 % ophthalmic solution Place 1 drop into both eyes daily as needed for allergies.     Marland Kitchen. ondansetron  (ZOFRAN-ODT) 4 MG disintegrating tablet Take 1 tablet (4 mg total) by mouth every 6 (six) hours as needed for nausea. 15 tablet 0  . polyethylene glycol (MIRALAX / GLYCOLAX) packet Take 17 g by mouth daily as needed for moderate constipation. 14 each 0  . pseudoephedrine (SUDAFED) 60 MG tablet Take 1 tablet (60 mg total) by mouth every 4 (four) hours as needed (allergic reaction). 60 tablet 1  . vitamin C (ASCORBIC ACID) 500 MG tablet Take 500 mg by mouth daily.       No facility-administered medications prior to visit.     ROS: Review of Systems  Constitutional: Negative for activity change, appetite change, chills and unexpected weight change.  HENT: Negative for congestion, mouth sores and sinus pressure.   Eyes: Negative for visual disturbance.  Respiratory: Negative for cough and chest tightness.   Gastrointestinal: Negative for abdominal pain and nausea.  Genitourinary: Negative for difficulty urinating, frequency and vaginal pain.  Musculoskeletal: Negative for back pain and gait problem.  Skin: Negative for pallor and rash.  Neurological: Negative for dizziness, tremors, weakness, numbness and headaches.  Psychiatric/Behavioral: Negative for confusion and sleep disturbance.    Objective:  BP 108/66 (BP Location: Left Arm, Patient Position: Sitting, Cuff Size: Normal)   Pulse (!) 47   Temp (!) 97.5 F (36.4 C) (Oral)   Ht 5\' 1"  (1.549 m)   Wt 118 lb (  53.5 kg)   SpO2 97%   BMI 22.30 kg/m   BP Readings from Last 3 Encounters:  03/29/18 108/66  02/14/18 98/60  01/28/18 100/60    Wt Readings from Last 3 Encounters:  03/29/18 118 lb (53.5 kg)  02/14/18 115 lb (52.2 kg)  01/28/18 114 lb (51.7 kg)    Physical Exam  Constitutional: She appears well-developed. No distress.  HENT:  Head: Normocephalic.  Right Ear: External ear normal.  Left Ear: External ear normal.  Nose: Nose normal.  Mouth/Throat: Oropharynx is clear and moist.  Eyes: Pupils are equal, round, and  reactive to light. Conjunctivae are normal. Right eye exhibits no discharge. Left eye exhibits no discharge.  Neck: Normal range of motion. Neck supple. No JVD present. No tracheal deviation present. No thyromegaly present.  Cardiovascular: Normal rate, regular rhythm and normal heart sounds.  Pulmonary/Chest: No stridor. No respiratory distress. She has no wheezes.  Abdominal: Soft. Bowel sounds are normal. She exhibits no distension and no mass. There is no tenderness. There is no rebound and no guarding.  Musculoskeletal: She exhibits no edema or tenderness.  Lymphadenopathy:    She has no cervical adenopathy.  Neurological: She displays normal reflexes. No cranial nerve deficit. She exhibits normal muscle tone. Coordination normal.  Skin: No rash noted. No erythema.  Psychiatric: She has a normal mood and affect. Her behavior is normal. Judgment and thought content normal.    Lab Results  Component Value Date   WBC 4.2 02/14/2018   HGB 13.0 02/14/2018   HCT 37.9 02/14/2018   PLT 180.0 02/14/2018   GLUCOSE 103 (H) 02/14/2018   CHOL 201 (H) 05/28/2017   TRIG 34.0 05/28/2017   HDL 70.00 05/28/2017   LDLDIRECT 134.0 01/25/2012   LDLCALC 124 (H) 05/28/2017   ALT 39 (H) 02/14/2018   AST 37 02/14/2018   NA 132 (L) 02/14/2018   K 4.3 02/14/2018   CL 98 02/14/2018   CREATININE 1.06 02/14/2018   BUN 17 02/14/2018   CO2 30 02/14/2018   TSH 1.38 01/28/2018   INR 0.97 08/01/2011    Ct Abdomen Pelvis W Contrast  Result Date: 08/26/2017 CLINICAL DATA:  62 year old female with abdominal pain. Concern for acute appendicitis. EXAM: CT ABDOMEN AND PELVIS WITH CONTRAST TECHNIQUE: Multidetector CT imaging of the abdomen and pelvis was performed using the standard protocol following bolus administration of intravenous contrast. CONTRAST:  OMNIPAQUE IOHEXOL 300 MG/ML  SOLN COMPARISON:  Ultrasound dated 06/10/2016 FINDINGS: Lower chest: The visualized lung bases are clear. Hepatobiliary: No  focal liver abnormality is seen. No gallstones, gallbladder wall thickening, or biliary dilatation. Pancreas: Unremarkable. No pancreatic ductal dilatation or surrounding inflammatory changes. Spleen: Normal in size without focal abnormality. Adrenals/Urinary Tract: Adrenal glands are unremarkable. Kidneys are normal, without renal calculi, focal lesion, or hydronephrosis. Bladder is unremarkable. Stomach/Bowel: There is no bowel dilatation or evidence of obstruction. Moderate stool noted throughout the colon. The appendix is enlarged and inflamed. There is an 11 mm stone in the midportion of the appendix. The appendix is located in the right lower quadrant inferior to the cecum and extending into the right hemipelvis along the right pelvic sidewall. The appendix measures approximately 12 mm in diameter. There is apparent focal area of discontinuity of the wall of the appendix (coronal series 6, image 51) suspicious for perforation. There is a small focus of air in the right hemipelvis adjacent to the sigmoid colon (coronal series 6, image 59) likely a focally contained perforation. There is a  small amount of complex appearing fluid within the pelvis with enhancement of the adjacent peritoneum. Vascular/Lymphatic: No significant vascular findings are present. No enlarged abdominal or pelvic lymph nodes. Reproductive: Hysterectomy. Other: None Musculoskeletal: No acute or significant osseous findings. Grade 1 L4-L5 anterolisthesis. IMPRESSION: Acute appendicitis with findings of perforation. Small complex fluid within the pelvis with peripheral enhancement is concerning for early abscess formation. These results were called by telephone at the time of interpretation on 08/26/2017 at 3:40 am to Dr. Dione Booze , who verbally acknowledged these results. Electronically Signed   By: Elgie Collard M.D.   On: 08/26/2017 03:42    Assessment & Plan:   There are no diagnoses linked to this encounter.   No orders of  the defined types were placed in this encounter.    Follow-up: No follow-ups on file.  Sonda Primes, MD

## 2018-03-29 NOTE — Assessment & Plan Note (Signed)
Better Repeat labs

## 2018-03-31 ENCOUNTER — Other Ambulatory Visit (INDEPENDENT_AMBULATORY_CARE_PROVIDER_SITE_OTHER): Payer: No Typology Code available for payment source

## 2018-03-31 DIAGNOSIS — R945 Abnormal results of liver function studies: Secondary | ICD-10-CM

## 2018-03-31 DIAGNOSIS — N3090 Cystitis, unspecified without hematuria: Secondary | ICD-10-CM | POA: Diagnosis not present

## 2018-03-31 DIAGNOSIS — R5383 Other fatigue: Secondary | ICD-10-CM

## 2018-03-31 DIAGNOSIS — R7989 Other specified abnormal findings of blood chemistry: Secondary | ICD-10-CM

## 2018-03-31 LAB — URINALYSIS
Bilirubin Urine: NEGATIVE
Ketones, ur: NEGATIVE
Leukocytes, UA: NEGATIVE
Nitrite: NEGATIVE
Specific Gravity, Urine: 1.01 (ref 1.000–1.030)
Total Protein, Urine: NEGATIVE
Urine Glucose: NEGATIVE
Urobilinogen, UA: 0.2 (ref 0.0–1.0)
pH: 7.5 (ref 5.0–8.0)

## 2018-03-31 LAB — BASIC METABOLIC PANEL
BUN: 21 mg/dL (ref 6–23)
CALCIUM: 9.1 mg/dL (ref 8.4–10.5)
CO2: 26 mEq/L (ref 19–32)
Chloride: 99 mEq/L (ref 96–112)
Creatinine, Ser: 1.01 mg/dL (ref 0.40–1.20)
GFR: 58.75 mL/min — AB (ref >60.00)
Glucose, Bld: 98 mg/dL (ref 70–99)
Potassium: 4.1 mEq/L (ref 3.5–5.1)
Sodium: 131 mEq/L — ABNORMAL LOW (ref 135–145)

## 2018-03-31 LAB — HEPATIC FUNCTION PANEL
ALT: 20 U/L (ref 0–35)
AST: 24 U/L (ref 0–37)
Albumin: 4.1 g/dL (ref 3.5–5.2)
Alkaline Phosphatase: 42 U/L (ref 39–117)
BILIRUBIN DIRECT: 0.1 mg/dL (ref 0.0–0.3)
BILIRUBIN TOTAL: 0.6 mg/dL (ref 0.2–1.2)
Total Protein: 6.6 g/dL (ref 6.0–8.3)

## 2018-03-31 LAB — CORTISOL: Cortisol, Plasma: 18.2 ug/dL

## 2018-05-16 ENCOUNTER — Other Ambulatory Visit: Payer: Self-pay | Admitting: Internal Medicine

## 2018-05-16 DIAGNOSIS — Z1231 Encounter for screening mammogram for malignant neoplasm of breast: Secondary | ICD-10-CM

## 2018-06-02 ENCOUNTER — Ambulatory Visit (INDEPENDENT_AMBULATORY_CARE_PROVIDER_SITE_OTHER): Payer: No Typology Code available for payment source | Admitting: Internal Medicine

## 2018-06-02 ENCOUNTER — Encounter: Payer: Self-pay | Admitting: Internal Medicine

## 2018-06-02 ENCOUNTER — Other Ambulatory Visit (INDEPENDENT_AMBULATORY_CARE_PROVIDER_SITE_OTHER): Payer: No Typology Code available for payment source

## 2018-06-02 VITALS — BP 106/62 | HR 44 | Temp 98.0°F | Ht 61.0 in | Wt 113.0 lb

## 2018-06-02 DIAGNOSIS — Z0000 Encounter for general adult medical examination without abnormal findings: Secondary | ICD-10-CM

## 2018-06-02 DIAGNOSIS — R7989 Other specified abnormal findings of blood chemistry: Secondary | ICD-10-CM | POA: Diagnosis not present

## 2018-06-02 DIAGNOSIS — R634 Abnormal weight loss: Secondary | ICD-10-CM | POA: Diagnosis not present

## 2018-06-02 DIAGNOSIS — B351 Tinea unguium: Secondary | ICD-10-CM

## 2018-06-02 DIAGNOSIS — M545 Low back pain, unspecified: Secondary | ICD-10-CM

## 2018-06-02 LAB — URINALYSIS, ROUTINE W REFLEX MICROSCOPIC
Bilirubin Urine: NEGATIVE
Ketones, ur: NEGATIVE
Nitrite: NEGATIVE
SPECIFIC GRAVITY, URINE: 1.01 (ref 1.000–1.030)
Total Protein, Urine: NEGATIVE
Urine Glucose: NEGATIVE
Urobilinogen, UA: 0.2 (ref 0.0–1.0)
pH: 7 (ref 5.0–8.0)

## 2018-06-02 LAB — CBC WITH DIFFERENTIAL/PLATELET
Basophils Absolute: 0.1 10*3/uL (ref 0.0–0.1)
Basophils Relative: 2 % (ref 0.0–3.0)
Eosinophils Absolute: 0.1 10*3/uL (ref 0.0–0.7)
Eosinophils Relative: 2.6 % (ref 0.0–5.0)
HCT: 38.9 % (ref 36.0–46.0)
Hemoglobin: 13.1 g/dL (ref 12.0–15.0)
Lymphocytes Relative: 36.6 % (ref 12.0–46.0)
Lymphs Abs: 1.5 10*3/uL (ref 0.7–4.0)
MCHC: 33.6 g/dL (ref 30.0–36.0)
MCV: 97.7 fl (ref 78.0–100.0)
Monocytes Absolute: 0.4 10*3/uL (ref 0.1–1.0)
Monocytes Relative: 9.1 % (ref 3.0–12.0)
NEUTROS PCT: 49.7 % (ref 43.0–77.0)
Neutro Abs: 2.1 10*3/uL (ref 1.4–7.7)
Platelets: 190 10*3/uL (ref 150.0–400.0)
RBC: 3.98 Mil/uL (ref 3.87–5.11)
RDW: 13.5 % (ref 11.5–15.5)
WBC: 4.2 10*3/uL (ref 4.0–10.5)

## 2018-06-02 LAB — BASIC METABOLIC PANEL
BUN: 17 mg/dL (ref 6–23)
CO2: 27 mEq/L (ref 19–32)
Calcium: 9.8 mg/dL (ref 8.4–10.5)
Chloride: 102 mEq/L (ref 96–112)
Creatinine, Ser: 1.07 mg/dL (ref 0.40–1.20)
GFR: 51.68 mL/min — ABNORMAL LOW (ref >60.00)
Glucose, Bld: 94 mg/dL (ref 70–99)
Potassium: 4.6 mEq/L (ref 3.5–5.1)
SODIUM: 137 meq/L (ref 135–145)

## 2018-06-02 LAB — LIPID PANEL
Cholesterol: 262 mg/dL — ABNORMAL HIGH (ref 0–200)
HDL: 115.6 mg/dL (ref >39.00)
LDL Cholesterol: 140 mg/dL — ABNORMAL HIGH (ref 0–99)
NonHDL: 146.21
Total CHOL/HDL Ratio: 2
Triglycerides: 29 mg/dL (ref 0.0–149.0)
VLDL: 5.8 mg/dL (ref 0.0–40.0)

## 2018-06-02 LAB — HEPATIC FUNCTION PANEL
ALT: 23 U/L (ref 0–35)
AST: 28 U/L (ref 0–37)
Albumin: 4.5 g/dL (ref 3.5–5.2)
Alkaline Phosphatase: 53 U/L (ref 39–117)
Bilirubin, Direct: 0.1 mg/dL (ref 0.0–0.3)
Total Bilirubin: 0.4 mg/dL (ref 0.2–1.2)
Total Protein: 6.8 g/dL (ref 6.0–8.3)

## 2018-06-02 LAB — TSH: TSH: 2.3 u[IU]/mL (ref 0.35–4.50)

## 2018-06-02 MED ORDER — CIPROFLOXACIN HCL 250 MG PO TABS: 250.0000 mg | ORAL_TABLET | Freq: Two times a day (BID) | ORAL | 0 refills | Status: DC

## 2018-06-02 MED ORDER — CICLOPIROX 8 % EX SOLN: Freq: Every day | CUTANEOUS | 0 refills | Status: DC

## 2018-06-02 MED ORDER — CYCLOBENZAPRINE HCL 5 MG PO TABS: 5.0000 mg | ORAL_TABLET | Freq: Three times a day (TID) | ORAL | 1 refills | Status: DC | PRN

## 2018-06-02 MED ORDER — DIAZEPAM 2 MG PO TABS: 2.0000 mg | ORAL_TABLET | Freq: Three times a day (TID) | ORAL | 0 refills | Status: DC | PRN

## 2018-06-02 MED ORDER — MELOXICAM 7.5 MG PO TABS: 7.5000 mg | ORAL_TABLET | Freq: Every day | ORAL | 0 refills | Status: DC

## 2018-06-02 NOTE — Assessment & Plan Note (Addendum)
We discussed age appropriate health related issues, including available/recomended screening tests and vaccinations. We discussed a need for adhering to healthy diet and exercise. Labs were ordered to be later reviewed . All questions were answered. Not eating red meat A cardiac CT scan for calcium scoring offered Nutrition referral

## 2018-06-02 NOTE — Assessment & Plan Note (Addendum)
[place] [facility] appt Thick toenails #1,2,3 B L>R Derm f/u

## 2018-06-02 NOTE — Patient Instructions (Signed)

## 2018-06-02 NOTE — Assessment & Plan Note (Signed)
Labs

## 2018-06-02 NOTE — Progress Notes (Signed)
Subjective:  Patient ID: Karen [name], female    DOB: 1955/08/31  Age: 63 y.o. MRN: [id number]  CC: No chief complaint on file.   HPI Karen [name] presents for a well exam C/o thick toenails, pains after tennis, occ anxiety Karen [name] would like to see a nutritionist  Outpatient Medications Prior to Visit  Medication Sig Dispense Refill  . Calcium-Vitamin D-Vitamin K (VIACTIV PO) Take 1 tablet by mouth 2 (two) times daily.    . cyclobenzaprine (FLEXERIL) 5 MG tablet Take 1 tablet (5 mg total) by mouth 3 (three) times daily as needed for muscle spasms. 90 tablet 1  . diazepam (VALIUM) 2 MG tablet Take 1 tablet (2 mg total) by mouth every 8 (eight) hours as needed for anxiety. 60 tablet 1  . diphenhydrAMINE (BENADRYL) 50 MG tablet Take 1 tablet (50 mg total) by mouth every 6 (six) hours as needed for itching or allergies. 30 tablet 1  . docusate sodium (COLACE) 100 MG capsule Take 1 capsule (100 mg total) by mouth 2 (two) times daily. 10 capsule 0  . EPINEPHrine (EPI-PEN) 0.3 mg/0.3 mL DEVI Inject 0.3 mg into the muscle daily as needed (anaphylaxis).     Marland Kitchen erythromycin ophthalmic ointment Place 1 application into both eyes at bedtime. (Patient taking differently: Place 1 application into both eyes daily as needed (conjunctivitis). ) 3.5 g 0  . estrogen-methylTESTOSTERone 0.625-1.25 MG per tablet Take 1 tablet by mouth daily. 90 tablet 3  . fexofenadine (ALLEGRA) 180 MG tablet Take 1 tablet (180 mg total) by mouth daily. 100 tablet 3  . fluticasone (FLONASE) 50 MCG/ACT nasal spray Place 2 sprays into both nostrils daily. (Patient taking differently: Place 2 sprays into both nostrils daily as needed for allergies. ) 16 g 6  . Multiple Vitamin (MULTIVITAMIN WITH MINERALS) TABS tablet Take 1 tablet by mouth daily.    Marland Kitchen olopatadine (PATANOL) 0.1 % ophthalmic solution Place 1 drop into both eyes daily as needed for allergies.     Marland Kitchen ondansetron (ZOFRAN-ODT) 4 MG disintegrating tablet Take 1 tablet (4 mg  total) by mouth every 6 (six) hours as needed for nausea. 15 tablet 0  . polyethylene glycol (MIRALAX / GLYCOLAX) packet Take 17 g by mouth daily as needed for moderate constipation. 14 each 0  . pseudoephedrine (SUDAFED) 60 MG tablet Take 1 tablet (60 mg total) by mouth every 4 (four) hours as needed (allergic reaction). 60 tablet 1  . vitamin C (ASCORBIC ACID) 500 MG tablet Take 500 mg by mouth daily.       No facility-administered medications prior to visit.     ROS: Review of Systems  Constitutional: Negative for activity change, appetite change, chills, fatigue and unexpected weight change.  HENT: Negative for congestion, mouth sores and sinus pressure.   Eyes: Negative for visual disturbance.  Respiratory: Negative for cough and chest tightness.   Gastrointestinal: Negative for abdominal pain and nausea.  Genitourinary: Negative for difficulty urinating, frequency and vaginal pain.  Musculoskeletal: Negative for back pain and gait problem.  Skin: Negative for pallor and rash.  Neurological: Negative for dizziness, tremors, weakness, numbness and headaches.  Psychiatric/Behavioral: Negative for confusion, sleep disturbance and suicidal ideas. The patient is nervous/anxious.     Objective:  BP 106/62 (BP Location: Left Arm, Patient Position: Sitting, Cuff Size: Normal)   Pulse (!) 44   Temp 98 F (36.7 C) (Oral)   Ht 5\' 1"  (1.549 m)   Wt 113 lb (51.3 kg)   SpO2 97%  BMI 21.35 kg/m   BP Readings from Last 3 Encounters:  06/02/18 106/62  03/29/18 108/66  02/14/18 98/60    Wt Readings from Last 3 Encounters:  06/02/18 113 lb (51.3 kg)  03/29/18 118 lb (53.5 kg)  02/14/18 115 lb (52.2 kg)    Physical Exam Constitutional:      General: She is not in acute distress.    Appearance: She is well-developed.  HENT:     Head: Normocephalic.     Right Ear: External ear normal.     Left Ear: External ear normal.     Nose: Nose normal.  Eyes:     General:        Right  eye: No discharge.        Left eye: No discharge.     Conjunctiva/sclera: Conjunctivae normal.     Pupils: Pupils are equal, round, and reactive to light.  Neck:     Musculoskeletal: Normal range of motion and neck supple.     Thyroid: No thyromegaly.     Vascular: No JVD.     Trachea: No tracheal deviation.  Cardiovascular:     Rate and Rhythm: Normal rate and regular rhythm.     Heart sounds: Normal heart sounds.  Pulmonary:     Effort: No respiratory distress.     Breath sounds: No stridor. No wheezing.  Abdominal:     General: Bowel sounds are normal. There is no distension.     Palpations: Abdomen is soft. There is no mass.     Tenderness: There is no abdominal tenderness. There is no guarding or rebound.  Musculoskeletal:        General: No tenderness.  Lymphadenopathy:     Cervical: No cervical adenopathy.  Skin:    Findings: No erythema or rash.  Neurological:     Cranial Nerves: No cranial nerve deficit.     Motor: No abnormal muscle tone.     Coordination: Coordination normal.     Deep Tendon Reflexes: Reflexes normal.  Psychiatric:        Behavior: Behavior normal.        Thought Content: Thought content normal.        Judgment: Judgment normal.   Thin Thick toenails #1,2,3 B L>R  Lab Results  Component Value Date   WBC 4.2 02/14/2018   HGB 13.0 02/14/2018   HCT 37.9 02/14/2018   PLT 180.0 02/14/2018   GLUCOSE 98 03/31/2018   CHOL 201 (H) 05/28/2017   TRIG 34.0 05/28/2017   HDL 70.00 05/28/2017   LDLDIRECT 134.0 01/25/2012   LDLCALC 124 (H) 05/28/2017   ALT 20 03/31/2018   AST 24 03/31/2018   NA 131 (L) 03/31/2018   K 4.1 03/31/2018   CL 99 03/31/2018   CREATININE 1.01 03/31/2018   BUN 21 03/31/2018   CO2 26 03/31/2018   TSH 1.38 01/28/2018   INR 0.97 08/01/2011    Ct Abdomen Pelvis W Contrast  Result Date: 08/26/2017 CLINICAL DATA:  63 year old female with abdominal pain. Concern for acute appendicitis. EXAM: CT ABDOMEN AND PELVIS WITH  CONTRAST TECHNIQUE: Multidetector CT imaging of the abdomen and pelvis was performed using the standard protocol following bolus administration of intravenous contrast. CONTRAST:  OMNIPAQUE IOHEXOL 300 MG/ML  SOLN COMPARISON:  Ultrasound dated 06/10/2016 FINDINGS: Lower chest: The visualized lung bases are clear. Hepatobiliary: No focal liver abnormality is seen. No gallstones, gallbladder wall thickening, or biliary dilatation. Pancreas: Unremarkable. No pancreatic ductal dilatation or surrounding inflammatory changes. Spleen:  Normal in size without focal abnormality. Adrenals/Urinary Tract: Adrenal glands are unremarkable. Kidneys are normal, without renal calculi, focal lesion, or hydronephrosis. Bladder is unremarkable. Stomach/Bowel: There is no bowel dilatation or evidence of obstruction. Moderate stool noted throughout the colon. The appendix is enlarged and inflamed. There is an 11 mm stone in the midportion of the appendix. The appendix is located in the right lower quadrant inferior to the cecum and extending into the right hemipelvis along the right pelvic sidewall. The appendix measures approximately 12 mm in diameter. There is apparent focal area of discontinuity of the wall of the appendix (coronal series 6, image 51) suspicious for perforation. There is a small focus of air in the right hemipelvis adjacent to the sigmoid colon (coronal series 6, image 59) likely a focally contained perforation. There is a small amount of complex appearing fluid within the pelvis with enhancement of the adjacent peritoneum. Vascular/Lymphatic: No significant vascular findings are present. No enlarged abdominal or pelvic lymph nodes. Reproductive: Hysterectomy. Other: None Musculoskeletal: No acute or significant osseous findings. Grade 1 L4-L5 anterolisthesis. IMPRESSION: Acute appendicitis with findings of perforation. Small complex fluid within the pelvis with peripheral enhancement is concerning for early  abscess formation. These results were called by telephone at the time of interpretation on 08/26/2017 at 3:40 am to Dr. Dione BoozeAVID GLICK , who verbally acknowledged these results. Electronically Signed   By: Elgie CollardArash  Radparvar M.D.   On: 08/26/2017 03:42    Assessment & Plan:   There are no diagnoses linked to this encounter.   No orders of the defined types were placed in this encounter.    Follow-up: No follow-ups on file.  Sonda PrimesAlex Lautaro Koral, MD

## 2018-06-13 ENCOUNTER — Ambulatory Visit (INDEPENDENT_AMBULATORY_CARE_PROVIDER_SITE_OTHER): Payer: No Typology Code available for payment source

## 2018-06-13 DIAGNOSIS — Z23 Encounter for immunization: Secondary | ICD-10-CM | POA: Diagnosis not present

## 2018-06-13 DIAGNOSIS — Z299 Encounter for prophylactic measures, unspecified: Secondary | ICD-10-CM

## 2018-06-28 ENCOUNTER — Ambulatory Visit
Admission: RE | Admit: 2018-06-28 | Discharge: 2018-06-28 | Disposition: A | Discharge status: Home / Self Care | Payer: No Typology Code available for payment source | Source: Ambulatory Visit | Attending: Internal Medicine | Admitting: Internal Medicine

## 2018-06-28 DIAGNOSIS — Z1231 Encounter for screening mammogram for malignant neoplasm of breast: Secondary | ICD-10-CM

## 2018-08-23 ENCOUNTER — Ambulatory Visit: Payer: No Typology Code available for payment source

## 2018-09-20 ENCOUNTER — Ambulatory Visit (INDEPENDENT_AMBULATORY_CARE_PROVIDER_SITE_OTHER): Payer: No Typology Code available for payment source

## 2018-09-20 DIAGNOSIS — Z23 Encounter for immunization: Secondary | ICD-10-CM

## 2018-09-20 DIAGNOSIS — Z299 Encounter for prophylactic measures, unspecified: Secondary | ICD-10-CM

## 2018-09-22 ENCOUNTER — Ambulatory Visit: Payer: No Typology Code available for payment source | Admitting: Internal Medicine

## 2018-10-19 ENCOUNTER — Ambulatory Visit: Payer: No Typology Code available for payment source

## 2018-12-28 ENCOUNTER — Encounter: Payer: Self-pay | Admitting: Internal Medicine

## 2018-12-28 ENCOUNTER — Other Ambulatory Visit (INDEPENDENT_AMBULATORY_CARE_PROVIDER_SITE_OTHER): Payer: No Typology Code available for payment source

## 2018-12-28 ENCOUNTER — Other Ambulatory Visit: Payer: Self-pay

## 2018-12-28 ENCOUNTER — Ambulatory Visit (INDEPENDENT_AMBULATORY_CARE_PROVIDER_SITE_OTHER): Payer: No Typology Code available for payment source | Admitting: Internal Medicine

## 2018-12-28 VITALS — BP 102/64 | HR 51 | Temp 97.8°F | Ht 61.0 in | Wt 118.0 lb

## 2018-12-28 DIAGNOSIS — M25561 Pain in right knee: Secondary | ICD-10-CM

## 2018-12-28 DIAGNOSIS — R7989 Other specified abnormal findings of blood chemistry: Secondary | ICD-10-CM | POA: Diagnosis not present

## 2018-12-28 DIAGNOSIS — M25569 Pain in unspecified knee: Secondary | ICD-10-CM | POA: Insufficient documentation

## 2018-12-28 DIAGNOSIS — R5383 Other fatigue: Secondary | ICD-10-CM | POA: Diagnosis not present

## 2018-12-28 DIAGNOSIS — Z23 Encounter for immunization: Secondary | ICD-10-CM | POA: Diagnosis not present

## 2018-12-28 DIAGNOSIS — J302 Other seasonal allergic rhinitis: Secondary | ICD-10-CM | POA: Diagnosis not present

## 2018-12-28 LAB — CBC WITH DIFFERENTIAL/PLATELET
Basophils Absolute: 0.1 10*3/uL (ref 0.0–0.1)
Basophils Relative: 1.3 % (ref 0.0–3.0)
Eosinophils Absolute: 0.1 10*3/uL (ref 0.0–0.7)
Eosinophils Relative: 1.8 % (ref 0.0–5.0)
HCT: 38.1 % (ref 36.0–46.0)
Hemoglobin: 12.8 g/dL (ref 12.0–15.0)
Lymphocytes Relative: 33.3 % (ref 12.0–46.0)
Lymphs Abs: 1.4 10*3/uL (ref 0.7–4.0)
MCHC: 33.7 g/dL (ref 30.0–36.0)
MCV: 97.8 fl (ref 78.0–100.0)
Monocytes Absolute: 0.4 10*3/uL (ref 0.1–1.0)
Monocytes Relative: 9.8 % (ref 3.0–12.0)
Neutro Abs: 2.3 10*3/uL (ref 1.4–7.7)
Neutrophils Relative %: 53.8 % (ref 43.0–77.0)
Platelets: 189 10*3/uL (ref 150.0–400.0)
RBC: 3.9 Mil/uL (ref 3.87–5.11)
RDW: 13.3 % (ref 11.5–15.5)
WBC: 4.4 10*3/uL (ref 4.0–10.5)

## 2018-12-28 LAB — BASIC METABOLIC PANEL
BUN: 19 mg/dL (ref 6–23)
CO2: 28 mEq/L (ref 19–32)
Calcium: 9.3 mg/dL (ref 8.4–10.5)
Chloride: 96 mEq/L (ref 96–112)
Creatinine, Ser: 0.86 mg/dL (ref 0.40–1.20)
GFR: 66.38 mL/min (ref >60.00)
Glucose, Bld: 97 mg/dL (ref 70–99)
Potassium: 4.1 mEq/L (ref 3.5–5.1)
Sodium: 129 mEq/L — ABNORMAL LOW (ref 135–145)

## 2018-12-28 LAB — URINALYSIS, ROUTINE W REFLEX MICROSCOPIC
Bilirubin Urine: NEGATIVE
Ketones, ur: NEGATIVE
Nitrite: NEGATIVE
Specific Gravity, Urine: 1.015 (ref 1.000–1.030)
Total Protein, Urine: NEGATIVE
Urine Glucose: NEGATIVE
Urobilinogen, UA: 0.2 (ref 0.0–1.0)
pH: 8 (ref 5.0–8.0)

## 2018-12-28 LAB — LIPID PANEL
Cholesterol: 202 mg/dL — ABNORMAL HIGH (ref 0–200)
HDL: 93.2 mg/dL (ref >39.00)
LDL Cholesterol: 103 mg/dL — ABNORMAL HIGH (ref 0–99)
NonHDL: 108.75
Total CHOL/HDL Ratio: 2
Triglycerides: 31 mg/dL (ref 0.0–149.0)
VLDL: 6.2 mg/dL (ref 0.0–40.0)

## 2018-12-28 LAB — HEPATIC FUNCTION PANEL
ALT: 19 U/L (ref 0–35)
AST: 28 U/L (ref 0–37)
Albumin: 4.1 g/dL (ref 3.5–5.2)
Alkaline Phosphatase: 48 U/L (ref 39–117)
Bilirubin, Direct: 0 mg/dL (ref 0.0–0.3)
Total Bilirubin: 0.4 mg/dL (ref 0.2–1.2)
Total Protein: 7 g/dL (ref 6.0–8.3)

## 2018-12-28 LAB — TSH: TSH: 2.13 u[IU]/mL (ref 0.35–4.50)

## 2018-12-28 MED ORDER — DICLOFENAC SODIUM 1 % TD GEL: 4.0000 g | Freq: Four times a day (QID) | TRANSDERMAL | 3 refills | Status: AC

## 2018-12-28 NOTE — Assessment & Plan Note (Addendum)
Sports med ref Voltaren gel 

## 2018-12-28 NOTE — Progress Notes (Signed)
Subjective:  Patient ID: Karen [name], female    DOB: 1955/12/30  Age: 63 y.o. MRN: [id number]  CC: No chief complaint on file.   HPI Zanna Hawn presents for allergies, elevated LFTs, elevated creatinine f/u  Outpatient Medications Prior to Visit  Medication Sig Dispense Refill  . Calcium-Vitamin D-Vitamin K (VIACTIV PO) Take 1 tablet by mouth 2 (two) times daily.    . ciclopirox (PENLAC) 8 % solution Apply topically at bedtime. Apply over nail and surrounding skin. Apply daily over previous coat. After seven (7) days, may remove with alcohol and continue cycle. 6.6 mL 0  . ciprofloxacin (CIPRO) 250 MG tablet Take 1 tablet (250 mg total) by mouth 2 (two) times daily. 10 tablet 0  . cyclobenzaprine (FLEXERIL) 5 MG tablet Take 1 tablet (5 mg total) by mouth 3 (three) times daily as needed for muscle spasms. 90 tablet 1  . diazepam (VALIUM) 2 MG tablet Take 1 tablet (2 mg total) by mouth every 8 (eight) hours as needed for anxiety. 60 tablet 0  . diphenhydrAMINE (BENADRYL) 50 MG tablet Take 1 tablet (50 mg total) by mouth every 6 (six) hours as needed for itching or allergies. 30 tablet 1  . docusate sodium (COLACE) 100 MG capsule Take 1 capsule (100 mg total) by mouth 2 (two) times daily. 10 capsule 0  . EPINEPHrine (EPI-PEN) 0.3 mg/0.3 mL DEVI Inject 0.3 mg into the muscle daily as needed (anaphylaxis).     Marland Kitchen erythromycin ophthalmic ointment Place 1 application into both eyes at bedtime. (Patient taking differently: Place 1 application into both eyes daily as needed (conjunctivitis). ) 3.5 g 0  . estrogen-methylTESTOSTERone 0.625-1.25 MG per tablet Take 1 tablet by mouth daily. 90 tablet 3  . fexofenadine (ALLEGRA) 180 MG tablet Take 1 tablet (180 mg total) by mouth daily. 100 tablet 3  . fluticasone (FLONASE) 50 MCG/ACT nasal spray Place 2 sprays into both nostrils daily. (Patient taking differently: Place 2 sprays into both nostrils daily as needed for allergies. ) 16 g 6  . hydrocortisone  2.5 % cream Apply topically 2 (two) times daily.    . meloxicam (MOBIC) 7.5 MG tablet Take 1 tablet (7.5 mg total) by mouth daily. 30 tablet 0  . Multiple Vitamin (MULTIVITAMIN WITH MINERALS) TABS tablet Take 1 tablet by mouth daily.    Marland Kitchen olopatadine (PATANOL) 0.1 % ophthalmic solution Place 1 drop into both eyes daily as needed for allergies.     Marland Kitchen ondansetron (ZOFRAN-ODT) 4 MG disintegrating tablet Take 1 tablet (4 mg total) by mouth every 6 (six) hours as needed for nausea. 15 tablet 0  . polyethylene glycol (MIRALAX / GLYCOLAX) packet Take 17 g by mouth daily as needed for moderate constipation. 14 each 0  . pseudoephedrine (SUDAFED) 60 MG tablet Take 1 tablet (60 mg total) by mouth every 4 (four) hours as needed (allergic reaction). 60 tablet 1  . vitamin C (ASCORBIC ACID) 500 MG tablet Take 500 mg by mouth daily.       No facility-administered medications prior to visit.     ROS: Review of Systems  Constitutional: Negative for activity change, appetite change, chills, fatigue and unexpected weight change.  HENT: Negative for congestion, mouth sores and sinus pressure.   Eyes: Negative for visual disturbance.  Respiratory: Negative for cough and chest tightness.   Gastrointestinal: Negative for abdominal pain and nausea.  Genitourinary: Negative for difficulty urinating, frequency and vaginal pain.  Musculoskeletal: Positive for arthralgias. Negative for back pain and  gait problem.  Skin: Negative for pallor and rash.  Neurological: Negative for dizziness, tremors, weakness, numbness and headaches.  Psychiatric/Behavioral: Negative for confusion, sleep disturbance and suicidal ideas.    Objective:  BP 102/64 (BP Location: Left Arm, Patient Position: Sitting, Cuff Size: Normal)   Pulse (!) 51   Temp 97.8 F (36.6 C) (Oral)   Ht 5\' 1"  (1.549 m)   Wt 118 lb (53.5 kg)   SpO2 96%   BMI 22.30 kg/m   BP Readings from Last 3 Encounters:  12/28/18 102/64  06/02/18 106/62  03/29/18  108/66    Wt Readings from Last 3 Encounters:  12/28/18 118 lb (53.5 kg)  06/02/18 113 lb (51.3 kg)  03/29/18 118 lb (53.5 kg)    Physical Exam Constitutional:      General: She is not in acute distress.    Appearance: She is well-developed.  HENT:     Head: Normocephalic.     Right Ear: External ear normal.     Left Ear: External ear normal.     Nose: Nose normal.  Eyes:     General:        Right eye: No discharge.        Left eye: No discharge.     Conjunctiva/sclera: Conjunctivae normal.     Pupils: Pupils are equal, round, and reactive to light.  Neck:     Musculoskeletal: Normal range of motion and neck supple.     Thyroid: No thyromegaly.     Vascular: No JVD.     Trachea: No tracheal deviation.  Cardiovascular:     Rate and Rhythm: Normal rate and regular rhythm.     Heart sounds: Normal heart sounds.  Pulmonary:     Effort: No respiratory distress.     Breath sounds: No stridor. No wheezing.  Abdominal:     General: Bowel sounds are normal. There is no distension.     Palpations: Abdomen is soft. There is no mass.     Tenderness: There is no abdominal tenderness. There is no guarding or rebound.  Musculoskeletal:        General: No tenderness.  Lymphadenopathy:     Cervical: No cervical adenopathy.  Skin:    Findings: No erythema or rash.  Neurological:     Cranial Nerves: No cranial nerve deficit.     Motor: No abnormal muscle tone.     Coordination: Coordination normal.     Deep Tendon Reflexes: Reflexes normal.  Psychiatric:        Behavior: Behavior normal.        Thought Content: Thought content normal.        Judgment: Judgment normal.    R knee cap - mobile   Lab Results  Component Value Date   WBC 4.2 06/02/2018   HGB 13.1 06/02/2018   HCT 38.9 06/02/2018   PLT 190.0 06/02/2018   GLUCOSE 94 06/02/2018   CHOL 262 (H) 06/02/2018   TRIG 29.0 06/02/2018   HDL 115.60 06/02/2018   LDLDIRECT 134.0 01/25/2012   LDLCALC 140 (H) 06/02/2018    ALT 23 06/02/2018   AST 28 06/02/2018   NA 137 06/02/2018   K 4.6 06/02/2018   CL 102 06/02/2018   CREATININE 1.07 06/02/2018   BUN 17 06/02/2018   CO2 27 06/02/2018   TSH 2.30 06/02/2018   INR 0.97 08/01/2011    Mm Digital Screening Bilateral  Result Date: 06/28/2018 CLINICAL DATA:  Screening. Prior bilateral reduction mammoplasties. EXAM: DIGITAL SCREENING  BILATERAL MAMMOGRAM WITH CAD COMPARISON:  Previous exam(s). ACR Breast Density Category c: The breast tissue is heterogeneously dense, which may obscure small masses. FINDINGS: There are no findings suspicious for malignancy. Images were processed with CAD. IMPRESSION: No mammographic evidence of malignancy. A result letter of this screening mammogram will be mailed directly to the patient. RECOMMENDATION: Screening mammogram in one year. (Code:SM-B-01Y) BI-RADS CATEGORY  1: Negative. Electronically Signed   By: Edwin CapJennifer  Jarosz M.D.   On: 06/28/2018 16:32    Assessment & Plan:   There are no diagnoses linked to this encounter.   No orders of the defined types were placed in this encounter.    Follow-up: No follow-ups on file.  Sonda PrimesAlex Plotnikov, MD

## 2018-12-28 NOTE — Addendum Note (Signed)
Addended by: Karren Cobble on: 12/28/2018 09:01 AM   Modules accepted: Orders

## 2018-12-28 NOTE — Patient Instructions (Signed)
Cardiac CT calcium scoring test $150   Computed tomography, more commonly known as a CT or CAT scan, is a diagnostic medical imaging test. Like traditional x-rays, it produces multiple images or pictures of the inside of the body. The cross-sectional images generated during a CT scan can be reformatted in multiple planes. They can even generate three-dimensional images. These images can be viewed on a computer monitor, printed on film or by a 3D printer, or transferred to a CD or DVD. CT images of internal organs, bones, soft tissue and blood vessels provide greater detail than traditional x-rays, particularly of soft tissues and blood vessels. A cardiac CT scan for coronary calcium is a non-invasive way of obtaining information about the presence, location and extent of calcified plaque in the coronary arteries-the vessels that supply oxygen-containing blood to the heart muscle. Calcified plaque results when there is a build-up of fat and other substances under the inner layer of the artery. This material can calcify which signals the presence of atherosclerosis, a disease of the vessel wall, also called coronary artery disease (CAD). People with this disease have an increased risk for heart attacks. In addition, over time, progression of plaque build up (CAD) can narrow the arteries or even close off blood flow to the heart. The result may be chest pain, sometimes called "angina," or a heart attack. Because calcium is a marker of CAD, the amount of calcium detected on a cardiac CT scan is a helpful prognostic tool. The findings on cardiac CT are expressed as a calcium score. Another name for this test is coronary artery calcium scoring.  What are some common uses of the procedure? The goal of cardiac CT scan for calcium scoring is to determine if CAD is present and to what extent, even if there are no symptoms. It is a screening study that may be recommended by a physician for patients with risk factors  for CAD but no clinical symptoms. The major risk factors for CAD are: . high blood cholesterol levels  . family history of heart attacks  . diabetes  . high blood pressure  . cigarette smoking  . overweight or obese  . physical inactivity   A negative cardiac CT scan for calcium scoring shows no calcification within the coronary arteries. This suggests that CAD is absent or so minimal it cannot be seen by this technique. The chance of having a heart attack over the next two to five years is very low under these circumstances. A positive test means that CAD is present, regardless of whether or not the patient is experiencing any symptoms. The amount of calcification-expressed as the calcium score-may help to predict the likelihood of a myocardial infarction (heart attack) in the coming years and helps your medical doctor or cardiologist decide whether the patient may need to take preventive medicine or undertake other measures such as diet and exercise to lower the risk for heart attack. The extent of CAD is graded according to your calcium score:  Calcium Score  Presence of CAD (coronary artery disease)  0 No evidence of CAD   1-10 Minimal evidence of CAD  11-100 Mild evidence of CAD  101-400 Moderate evidence of CAD  Over 400 Extensive evidence of CAD    

## 2018-12-28 NOTE — Assessment & Plan Note (Signed)
Doing well no issues 

## 2018-12-28 NOTE — Assessment & Plan Note (Signed)
Exercise less 

## 2018-12-28 NOTE — Assessment & Plan Note (Signed)
Labs

## 2018-12-28 NOTE — Assessment & Plan Note (Signed)
Allegra, Flonase, Patanol Benadryl, Afrin Singulair

## 2019-02-14 ENCOUNTER — Ambulatory Visit: Payer: No Typology Code available for payment source | Admitting: Family Medicine

## 2019-03-28 ENCOUNTER — Emergency Department (HOSPITAL_COMMUNITY): Payer: No Typology Code available for payment source

## 2019-03-28 ENCOUNTER — Other Ambulatory Visit: Payer: Self-pay

## 2019-03-28 ENCOUNTER — Emergency Department (HOSPITAL_COMMUNITY)
Admission: EM | Admit: 2019-03-28 | Discharge: 2019-03-29 | Disposition: A | Discharge status: Home / Self Care | Payer: No Typology Code available for payment source | Attending: Emergency Medicine | Admitting: Emergency Medicine

## 2019-03-28 ENCOUNTER — Encounter (HOSPITAL_COMMUNITY): Payer: Self-pay

## 2019-03-28 ENCOUNTER — Ambulatory Visit: Payer: Self-pay

## 2019-03-28 DIAGNOSIS — Z79899 Other long term (current) drug therapy: Secondary | ICD-10-CM | POA: Diagnosis not present

## 2019-03-28 DIAGNOSIS — Z793 Long term (current) use of hormonal contraceptives: Secondary | ICD-10-CM | POA: Insufficient documentation

## 2019-03-28 DIAGNOSIS — R2689 Other abnormalities of gait and mobility: Secondary | ICD-10-CM | POA: Insufficient documentation

## 2019-03-28 DIAGNOSIS — R110 Nausea: Secondary | ICD-10-CM | POA: Diagnosis not present

## 2019-03-28 DIAGNOSIS — R001 Bradycardia, unspecified: Secondary | ICD-10-CM | POA: Diagnosis not present

## 2019-03-28 DIAGNOSIS — R42 Dizziness and giddiness: Secondary | ICD-10-CM

## 2019-03-28 LAB — CBG MONITORING, ED: Glucose-Capillary: 88 mg/dL (ref 70–99)

## 2019-03-28 LAB — COMPREHENSIVE METABOLIC PANEL
ALT: 22 U/L (ref 0–44)
AST: 31 U/L (ref 15–41)
Albumin: 3.6 g/dL (ref 3.5–5.0)
Alkaline Phosphatase: 45 U/L (ref 38–126)
Anion gap: 7 (ref 5–15)
BUN: 18 mg/dL (ref 8–23)
CO2: 26 mmol/L (ref 22–32)
Calcium: 8.6 mg/dL — ABNORMAL LOW (ref 8.9–10.3)
Chloride: 99 mmol/L (ref 98–111)
Creatinine, Ser: 1.07 mg/dL — ABNORMAL HIGH (ref 0.44–1.00)
GFR calc Af Amer: >60 mL/min (ref >60)
GFR calc non Af Amer: 55 mL/min — ABNORMAL LOW (ref >60)
Glucose, Bld: 111 mg/dL — ABNORMAL HIGH (ref 70–99)
Potassium: 3.9 mmol/L (ref 3.5–5.1)
Sodium: 132 mmol/L — ABNORMAL LOW (ref 135–145)
Total Bilirubin: 0.8 mg/dL (ref 0.3–1.2)
Total Protein: 6.3 g/dL — ABNORMAL LOW (ref 6.5–8.1)

## 2019-03-28 LAB — I-STAT CHEM 8, ED
BUN: 19 mg/dL (ref 8–23)
Calcium, Ion: 1.12 mmol/L — ABNORMAL LOW (ref 1.15–1.40)
Chloride: 99 mmol/L (ref 98–111)
Creatinine, Ser: 1.1 mg/dL — ABNORMAL HIGH (ref 0.44–1.00)
Glucose, Bld: 104 mg/dL — ABNORMAL HIGH (ref 70–99)
HCT: 35 % — ABNORMAL LOW (ref 36.0–46.0)
Hemoglobin: 11.9 g/dL — ABNORMAL LOW (ref 12.0–15.0)
Potassium: 3.9 mmol/L (ref 3.5–5.1)
Sodium: 132 mmol/L — ABNORMAL LOW (ref 135–145)
TCO2: 27 mmol/L (ref 22–32)

## 2019-03-28 LAB — CBC
HCT: 35 % — ABNORMAL LOW (ref 36.0–46.0)
Hemoglobin: 11.9 g/dL — ABNORMAL LOW (ref 12.0–15.0)
MCH: 33.2 pg (ref 26.0–34.0)
MCHC: 34 g/dL (ref 30.0–36.0)
MCV: 97.8 fL (ref 80.0–100.0)
Platelets: 172 10*3/uL (ref 150–400)
RBC: 3.58 MIL/uL — ABNORMAL LOW (ref 3.87–5.11)
RDW: 13.6 % (ref 11.5–15.5)
WBC: 4.9 10*3/uL (ref 4.0–10.5)
nRBC: 0 % (ref 0.0–0.2)

## 2019-03-28 LAB — DIFFERENTIAL
Abs Immature Granulocytes: 0.01 10*3/uL (ref 0.00–0.07)
Basophils Absolute: 0 10*3/uL (ref 0.0–0.1)
Basophils Relative: 1 %
Eosinophils Absolute: 0 10*3/uL (ref 0.0–0.5)
Eosinophils Relative: 1 %
Immature Granulocytes: 0 %
Lymphocytes Relative: 36 %
Lymphs Abs: 1.8 10*3/uL (ref 0.7–4.0)
Monocytes Absolute: 0.4 10*3/uL (ref 0.1–1.0)
Monocytes Relative: 9 %
Neutro Abs: 2.6 10*3/uL (ref 1.7–7.7)
Neutrophils Relative %: 53 %

## 2019-03-28 LAB — PROTIME-INR
INR: 1 (ref 0.8–1.2)
Prothrombin Time: 13.1 seconds (ref 11.4–15.2)

## 2019-03-28 LAB — APTT: aPTT: 34 seconds (ref 24–36)

## 2019-03-28 MED ORDER — SODIUM CHLORIDE 0.9% FLUSH
3.0000 mL | Freq: Once | INTRAVENOUS | Status: AC
  Administered 2019-03-29: 3 mL via INTRAVENOUS

## 2019-03-28 NOTE — ED Triage Notes (Signed)
To hallway via EMS.  Onset 9am this morning pt was walking started suddenly got dizzy-lightheadedness,headache, queasey, and  could not walk in straight line.  Pt sat down on curb and called husband.  Gait disturbance lasted few minutes, lightheadedness, queasiness and headache lasted all day.  No blurred vision. EMS BP 145/72 HR 46  CBG 103 EKG SB

## 2019-03-28 NOTE — ED Notes (Signed)
Patient transported to CT 

## 2019-03-28 NOTE — Telephone Encounter (Signed)
Walking this am and  15 minutes  into walk suddenly ataxic, dizziness no blurred vision and fell Sat called husband and picked her up feels dizzy, headache-" like motion sickness" nauseated. "Feels weird in my head."  Denies vision changes, slurred speech or weakness or numbness to face arms or legs. Care advice given and advised pt to call 911. Pt verbalized understanding.  Reason for Disposition . [1] MODERATE dizziness (e.g., interferes with normal activities) AND [2] has NOT been evaluated by physician for this  (Exception: dizziness caused by heat exposure, sudden standing, or poor fluid intake)  Answer Assessment - Initial Assessment Questions 1. DESCRIPTION: "Describe your dizziness."     Like motion sickness 2. LIGHTHEADED: "Do you feel lightheaded?" (e.g., somewhat faint, woozy, weak upon standing)     yes 3. VERTIGO: "Do you feel like either you or the room is spinning or tilting?" (i.e. vertigo)     no 4. SEVERITY: "How bad is it?"  "Do you feel like you are going to faint?" "Can you stand and walk?"   - MILD - walking normally   - MODERATE - interferes with normal activities (e.g., work, school)    - SEVERE - unable to stand, requires support to walk, feels like passing out now.      moderate 5. ONSET:  "When did the dizziness begin?"    This am 6. AGGRAVATING FACTORS: "Does anything make it worse?" (e.g., standing, change in head position)     No-  7. HEART RATE: "Can you tell me your heart rate?" "How many beats in 15 seconds?"  (Note: not all patients can do this)       18- HR 48/minutes 8. CAUSE: "What do you think is causing the dizziness?"    Blue tooth and electronic equipment 9. RECURRENT SYMPTOM: "Have you had dizziness before?" If so, ask: "When was the last time?" "What happened that time?"     no 10. OTHER SYMPTOMS: "Do you have any other symptoms?" (e.g., fever, chest pain, vomiting, diarrhea, bleeding)       Slight headache, nausea 11. PREGNANCY: "Is there any  chance you are pregnant?" "When was your last menstrual period?"       n/a  Answer Assessment - Initial Assessment Questions 1. SYMPTOM: "What is the main symptom you are concerned about?" (e.g., weakness, numbness)     Dizziness, nausea headache 2. ONSET: "When did this start?" (minutes, hours, days; while sleeping)     This am 3. LAST NORMAL: "When was the last time you were normal (no symptoms)?"     Earlier in the am 4. PATTERN "Does this come and go, or has it been constant since it started?"  "Is it present now?"    Constant-yes 5. CARDIAC SYMPTOMS: "Have you had any of the following symptoms: chest pain, difficulty breathing, palpitations?"     no 6. NEUROLOGIC SYMPTOMS: "Have you had any of the following symptoms: headache, dizziness, vision loss, double vision, changes in speech, unsteady on your feet?"     Headache, dizziness, ataxia, unsteady on feet. 7. OTHER SYMPTOMS: "Do you have any other symptoms?"     no 8. PREGNANCY: "Is there any chance you are pregnant?" "When was your last menstrual period?"     n/a  Protocols used: DIZZINESS - LIGHTHEADEDNESS-A-AH, NEUROLOGIC DEFICIT-A-AH

## 2019-03-29 ENCOUNTER — Encounter (HOSPITAL_COMMUNITY): Payer: Self-pay | Admitting: *Deleted

## 2019-03-29 ENCOUNTER — Emergency Department (HOSPITAL_COMMUNITY): Payer: No Typology Code available for payment source

## 2019-03-29 MED ORDER — ONDANSETRON HCL 4 MG/2ML IJ SOLN
4.0000 mg | Freq: Once | INTRAMUSCULAR | Status: AC
  Administered 2019-03-29: 4 mg via INTRAVENOUS
  Filled 2019-03-29: qty 2

## 2019-03-29 MED ORDER — SODIUM CHLORIDE 0.9 % IV BOLUS
1000.0000 mL | Freq: Once | INTRAVENOUS | Status: AC
  Administered 2019-03-29: 1000 mL via INTRAVENOUS

## 2019-03-29 MED ORDER — ONDANSETRON 4 MG PO TBDP: 4.0000 mg | ORAL_TABLET | Freq: Three times a day (TID) | ORAL | 0 refills | Status: DC | PRN

## 2019-03-29 MED ORDER — MECLIZINE HCL 25 MG PO TABS: 25.0000 mg | ORAL_TABLET | Freq: Three times a day (TID) | ORAL | 0 refills | Status: AC | PRN

## 2019-03-29 MED ORDER — MECLIZINE HCL 25 MG PO TABS
25.0000 mg | ORAL_TABLET | Freq: Once | ORAL | Status: AC
  Administered 2019-03-29: 25 mg via ORAL
  Filled 2019-03-29: qty 1

## 2019-03-29 NOTE — Telephone Encounter (Signed)
Pt went to ED

## 2019-03-29 NOTE — ED Notes (Signed)
Patient transported to MRI 

## 2019-03-29 NOTE — ED Notes (Signed)
Discharge instructions reviewed with pt. Pt verbalized understanding.   

## 2019-03-29 NOTE — ED Notes (Signed)
Pt ambulated to restroom. 

## 2019-03-29 NOTE — ED Provider Notes (Signed)
[facility] EMERGENCY DEPARTMENT Provider Note   CSN: [id number] Arrival date & time: 03/28/19  1837     History   Chief Complaint Chief Complaint  Patient presents with   Dizziness    HPI Kissy Cielo is a 63 y.o. female.     HPI  This is a 64 year old female with a history of arthritis who presents with dizziness and abnormal gait.  Patient reports that she was walking this morning.  She reports that she normally walks approximately 10 miles per day.  She states she is very active and healthy.  This morning she was on her walk when she had acute onset of dizziness.  She describes it as lightheadedness.  She states that at that time she noted that she "could not walk in a straight line."  She states that she was listing to the left into the right.  She fell onto the grass.  Her husband came to pick her up.  She states that she has had persistent lightheadedness and nausea throughout the day.  She also reports a dull headache.  She has never had anything like this before.  No history of vertigo.  Denies any recent upper respiratory symptoms or fevers.  She does state that her symptoms worsen with position changes.  No known history of hypertension, hyperlipidemia, atrial fibrillation.  Past Medical History:  Diagnosis Date   Arthritis    right   Climacteric    on HRT   Complication of anesthesia    Epiglottitis 1959   tracheotomy   Finger pain    pain with pressure on the PIP joint with hemorrhage and swelling. MRI hand inconclusive    History of varicella    Lumbar back pain 1999    initial strain. Intermittent pain since. Re-injured Dec '11. Had PT IN THE PAST.   Plantar fasciitis    Pneumonia 1993   hospitalized   PONV (postoperative nausea and vomiting)     Patient Active Problem List   Diagnosis Date Noted   Knee pain 12/28/2018   Onychomycosis 06/02/2018   Cystitis 03/29/2018   Elevated LFTs 03/29/2018   Elevated serum creatinine  02/14/2018   Fatigue 01/28/2018   Toe pain 01/28/2018   Acute perforated appendicitis 08/26/2017   Perforated appendicitis 08/26/2017   Upper airway cough syndrome 10/16/2016   Asthmatic bronchitis 07/31/2016   Microhematuria 06/04/2016   Well adult exam 05/27/2016   Paresthesia 05/27/2016   Tinea pedis 05/27/2016   Laryngitis 03/24/2016   Pain in joint, shoulder region 03/24/2016   Chest pain 01/01/2016   Allergic rhinitis 08/06/2015   Allergic conjunctivitis 08/06/2015   Grief 08/06/2015   Anaphylactic reaction 05/02/2015   Screen for colon cancer 05/02/2015   Muscle spasms of neck 10/14/2014   Left groin pain 01/26/2012   Foot cramps 01/26/2012   Pneumonia, organism unspecified(486) 08/04/2011   Healthcare maintenance 11/23/2010   Arthritis    Lumbar back pain    Finger pain    Surgical menopause     Past Surgical History:  Procedure Laterality Date   ABDOMINAL HYSTERECTOMY     endometriosis   APPENDECTOMY  08/26/2017   ARTHROSCOPIC REPAIR ACL  1995   right-reconstructed with patellar tendon   FOOT SURGERY  1999   left foot-arch ligament release (metatarsal release ?)   LAPAROSCOPIC APPENDECTOMY N/A 08/26/2017   Procedure: APPENDECTOMY LAPAROSCOPIC;  Surgeon: Gaynelle Adu, MD;  Location: [facility] OR;  Service: General;  Laterality: N/A;   MENISCUS REPAIR  [phone]  right medial/ open   REDUCTION MAMMAPLASTY Bilateral 1976   reduction mammoplasty  1975   TOTAL ABDOMINAL HYSTERECTOMY W/ BILATERAL SALPINGOOPHORECTOMY  '02     OB History   No obstetric history on file.      Home Medications    Prior to Admission medications   Medication Sig Start Date End Date Taking? Authorizing Provider  Calcium-Vitamin D-Vitamin K (VIACTIV PO) Take 1 tablet by mouth 2 (two) times daily. CHEW   Yes [provider]  cyclobenzaprine (FLEXERIL) 5 MG tablet Take 1 tablet (5 mg total) by mouth 3 (three) times daily as needed for muscle spasms.  06/02/18  Yes Plotnikov, Georgina Quint, MD  diazepam (VALIUM) 2 MG tablet Take 1 tablet (2 mg total) by mouth every 8 (eight) hours as needed for anxiety. 06/02/18  Yes Plotnikov, Georgina Quint, MD  diclofenac sodium (VOLTAREN) 1 % GEL Apply 4 g topically 4 (four) times daily. Patient taking differently: Apply 4 g topically See admin instructions. Apply 4 grams to the right knee four times daily 12/28/18  Yes Plotnikov, Georgina Quint, MD  diphenhydrAMINE (BENADRYL) 50 MG tablet Take 1 tablet (50 mg total) by mouth every 6 (six) hours as needed for itching or allergies. 05/28/17  Yes Plotnikov, Georgina Quint, MD  docusate sodium (COLACE) 100 MG capsule Take 1 capsule (100 mg total) by mouth 2 (two) times daily. Patient taking differently: Take 100 mg by mouth See admin instructions. Take 100 mg by mouth one to two times daily 09/01/17  Yes Meuth, Brooke A, PA-C  estrogen-methylTESTOSTERone 0.625-1.25 MG per tablet Take 1 tablet by mouth daily. 02/02/14  Yes Baxley, Luanna Cole, MD  fexofenadine (ALLEGRA) 180 MG tablet Take 1 tablet (180 mg total) by mouth daily. 05/28/17  Yes Plotnikov, Georgina Quint, MD  hydrocortisone 2.5 % cream Apply 1 application topically 2 (two) times daily as needed (for rashes).    Yes [provider]  Multiple Vitamin (MULTIVITAMIN WITH MINERALS) TABS tablet Take 1 tablet by mouth daily.   Yes [provider]  olopatadine (PATANOL) 0.1 % ophthalmic solution Place 1 drop into both eyes daily as needed for allergies.    Yes [provider]  polyethylene glycol (MIRALAX / GLYCOLAX) packet Take 17 g by mouth daily as needed for moderate constipation. Patient taking differently: Take 17 g by mouth daily as needed for moderate constipation (MIX AND DRINK).  09/01/17  Yes Meuth, Brooke A, PA-C  pseudoephedrine (SUDAFED) 60 MG tablet Take 1 tablet (60 mg total) by mouth every 4 (four) hours as needed (allergic reaction). 06/01/17  Yes Plotnikov, Georgina Quint, MD  vitamin C (ASCORBIC ACID) 500 MG  tablet Take 500 mg by mouth daily.     Yes [provider]  ciclopirox (PENLAC) 8 % solution Apply topically at bedtime. Apply over nail and surrounding skin. Apply daily over previous coat. After seven (7) days, may remove with alcohol and continue cycle. Patient not taking: Reported on 03/29/2019 06/02/18   Plotnikov, Georgina Quint, MD  ciprofloxacin (CIPRO) 250 MG tablet Take 1 tablet (250 mg total) by mouth 2 (two) times daily. Patient not taking: Reported on 03/29/2019 06/02/18   Plotnikov, Georgina Quint, MD  EPINEPHrine (EPI-PEN) 0.3 mg/0.3 mL DEVI Inject 0.3 mg into the muscle once as needed (anaphylaxis).     [provider]  erythromycin ophthalmic ointment Place 1 application into both eyes at bedtime. Patient not taking: Reported on 03/29/2019 08/06/15   Plotnikov, Georgina Quint, MD  fluticasone (FLONASE) 50 MCG/ACT  nasal spray Place 2 sprays into both nostrils daily. Patient not taking: Reported on 03/29/2019 08/06/15   Plotnikov, Georgina QuintAleksei V, MD  meclizine (ANTIVERT) 25 MG tablet Take 1 tablet (25 mg total) by mouth 3 (three) times daily as needed for dizziness. 03/29/19   Demetruis Depaul, Mayer Maskerourtney F, MD  ondansetron (ZOFRAN ODT) 4 MG disintegrating tablet Take 1 tablet (4 mg total) by mouth every 8 (eight) hours as needed for nausea or vomiting. 03/29/19   Morgen Linebaugh, Mayer Maskerourtney F, MD    Family History Family History  Problem Relation Age of Onset   Hypertension Mother    Heart disease Father        CABG   Hyperlipidemia Father    Hypertension Father    Obesity Father    Gout Father    Alcohol abuse Father    HIV Brother    Cancer Neg Hx    Diabetes Neg Hx     Social History Social History   Tobacco Use   Smoking status: Never Smoker   Smokeless tobacco: Never Used  Substance Use Topics   Alcohol use: No   Drug use: No     Allergies   Penicillins, Adhesive [tape], Hydrocodone, and Singulair [montelukast sodium]   Review of Systems Review of Systems    Constitutional: Negative for fever.  Respiratory: Negative for shortness of breath.   Cardiovascular: Negative for chest pain.  Gastrointestinal: Positive for nausea. Negative for abdominal pain and vomiting.  Musculoskeletal: Positive for gait problem.  Neurological: Positive for dizziness. Negative for speech difficulty, weakness and numbness.  All other systems reviewed and are negative.    Physical Exam Updated Vital Signs BP 104/62    Pulse (!) 57    Temp 98.1 F (36.7 C) (Oral)    Resp 13    SpO2 100%   Physical Exam Vitals signs and nursing note reviewed.  Constitutional:      Appearance: She is well-developed. She is not ill-appearing.  HENT:     Head: Normocephalic and atraumatic.     Mouth/Throat:     Mouth: Mucous membranes are moist.  Eyes:     Pupils: Pupils are equal, round, and reactive to light.  Neck:     Musculoskeletal: Neck supple.  Cardiovascular:     Rate and Rhythm: Regular rhythm. Bradycardia present.     Heart sounds: Normal heart sounds.  Pulmonary:     Effort: Pulmonary effort is normal. No respiratory distress.     Breath sounds: No wheezing.  Abdominal:     General: Bowel sounds are normal.     Palpations: Abdomen is soft.  Musculoskeletal:     Right lower leg: No edema.     Left lower leg: No edema.  Skin:    General: Skin is warm and dry.  Neurological:     Mental Status: She is alert and oriented to person, place, and time.     Comments: Fluent speech, cranial nerves II through XII intact, no dysmetria to finger-nose-finger, positive Romberg, patient with normal regular gait, difficulty with tandem gait, no ataxia noted  Psychiatric:        Mood and Affect: Mood normal.      ED Treatments / Results  Labs (all labs ordered are listed, but only abnormal results are displayed) Labs Reviewed  CBC - Abnormal; Notable for the following components:      Result Value   RBC 3.58 (*)    Hemoglobin 11.9 (*)    HCT 35.0 (*)  All other  components within normal limits  COMPREHENSIVE METABOLIC PANEL - Abnormal; Notable for the following components:   Sodium 132 (*)    Glucose, Bld 111 (*)    Creatinine, Ser 1.07 (*)    Calcium 8.6 (*)    Total Protein 6.3 (*)    GFR calc non Af Amer 55 (*)    All other components within normal limits  I-STAT CHEM 8, ED - Abnormal; Notable for the following components:   Sodium 132 (*)    Creatinine, Ser 1.10 (*)    Glucose, Bld 104 (*)    Calcium, Ion 1.12 (*)    Hemoglobin 11.9 (*)    HCT 35.0 (*)    All other components within normal limits  PROTIME-INR  APTT  DIFFERENTIAL  CBG MONITORING, ED    EKG EKG Interpretation  Date/Time:  Tuesday March 28 2019 19:10:00 EST Ventricular Rate:  48 PR Interval:  140 QRS Duration: 70 QT Interval:  502 QTC Calculation: 448 R Axis:   70 Text Interpretation: Sinus bradycardia Possible Anterior infarct , age undetermined Abnormal ECG No significant change since last tracing Confirmed by Ross Marcus ([id number]) on 03/29/2019 2:09:46 AM   Radiology Ct Head Wo Contrast  Result Date: 03/28/2019 CLINICAL DATA:  63 year old female with dizziness, lightheadedness and headache, unable to walk in a straight line since this morning. EXAM: CT HEAD WITHOUT CONTRAST TECHNIQUE: Contiguous axial images were obtained from the base of the skull through the vertex without intravenous contrast. COMPARISON:  None. FINDINGS: Brain: No evidence of acute infarction, hemorrhage, hydrocephalus, extra-axial collection or mass lesion/mass effect. Vascular: No hyperdense vessel or unexpected calcification. Skull: Normal. Negative for fracture or focal lesion. Sinuses/Orbits: No acute finding. Other: None. IMPRESSION: 1. No acute intracranial abnormalities. The appearance of the brain is normal. Electronically Signed   By: Trudie Reed M.D.   On: 03/28/2019 19:48   Mr Brain Wo Contrast  Result Date: 03/29/2019 CLINICAL DATA:  Sudden onset dizziness with  lightheadedness. EXAM: MRI HEAD WITHOUT CONTRAST TECHNIQUE: Multiplanar, multiecho pulse sequences of the brain and surrounding structures were obtained without intravenous contrast. COMPARISON:  Head CT from yesterday FINDINGS: Brain: No acute infarction, hemorrhage, hydrocephalus, extra-axial collection or mass lesion. Single, small and chronic left inferior frontal white matter insult, nonspecific and commonly seen by patient age. Normal brain volume. Vascular: Normal flow voids, including vertebrobasilar Skull and upper cervical spine: Negative for marrow lesion. Right-sided facet spurring in the visible upper cervical spine. Sinuses/Orbits: Negative.  No mastoid or middle ear opacification IMPRESSION: Negative for infarct or other cause of symptoms. Electronically Signed   By: Marnee Spring M.D.   On: 03/29/2019 06:04    Procedures Procedures (including critical care time)  Medications Ordered in ED Medications  sodium chloride flush (NS) 0.9 % injection 3 mL (3 mLs Intravenous Given 03/29/19 0155)  sodium chloride 0.9 % bolus 1,000 mL (0 mLs Intravenous Stopped 03/29/19 0411)  meclizine (ANTIVERT) tablet 25 mg (25 mg Oral Given 03/29/19 0240)  ondansetron (ZOFRAN) injection 4 mg (4 mg Intravenous Given 03/29/19 0235)     Initial Impression / Assessment and Plan / ED Course  I have reviewed the triage vital signs and the nursing notes.  Pertinent labs & imaging results that were available during my care of the patient were reviewed by me and considered in my medical decision making (see chart for details).        Patient presents with vertiginous symptoms.  Onset of symptoms at 9 AM  yesterday.  Persistent and indolent throughout the day.  She is overall nontoxic and vital signs are largely reassuring.  She is bradycardic in the 50s but walks up to 10 miles a day and is otherwise asymptomatic.  On neurologic exam she has no dysmetria.  She does have a slightly positive Romberg and is  somewhat unsteady with tandem gait.  For this and given reports of gait disturbance earlier yesterday, will obtain MRI to rule out acute stroke as a central cause of vertigo.  There are characteristics of her dizziness that are more suggestive of peripheral vertigo including positional change and nausea.  Patient was treated with meclizine and Zofran.  MRI reviewed by myself and is negative for acute stroke.   Patient is ambulatory independently the bathroom and has had improvement of symptoms.  I discussed the results with her at length.  She has an ENT she can follow-up with.  Will discharge with meclizine and Zofran.  After history, exam, and medical workup I feel the patient has been appropriately medically screened and is safe for discharge home. Pertinent diagnoses were discussed with the patient. Patient was given return precautions.   Final Clinical Impressions(s) / ED Diagnoses   Final diagnoses:  Vertigo    ED Discharge Orders         Ordered    meclizine (ANTIVERT) 25 MG tablet  3 times daily PRN     03/29/19 0619    ondansetron (ZOFRAN ODT) 4 MG disintegrating tablet  Every 8 hours PRN     03/29/19 0619           Merryl Hacker, MD 03/29/19 [phone]

## 2019-03-29 NOTE — Discharge Instructions (Addendum)
You were seen today for dizziness.   You likely have some vertigo.  Your MRI was negative for acute stroke.  This is likely benign positional vertigo.  Take medications as prescribed.  Make sure to stay hydrated.  If you have recurrence of symptoms, follow-up with your ENT.

## 2019-03-30 ENCOUNTER — Ambulatory Visit: Payer: Self-pay | Admitting: *Deleted

## 2019-03-30 DIAGNOSIS — R42 Dizziness and giddiness: Secondary | ICD-10-CM

## 2019-03-30 NOTE — Telephone Encounter (Signed)
Please advise 

## 2019-03-30 NOTE — Telephone Encounter (Signed)
Patient calls requesting a referral to [facility] ENT, Dr. Kalman Shan. Stated she called and they require a referral for vertigo from the PCP.  Also, reviewed prescription medications Meclizine and Ondansetron to take for dizziness and nausea. Okay to take Advil for headache. Routing to PCP for ENT referral, please.   Answer Assessment - Initial Assessment Questions 1. REASON FOR CALL or QUESTION: "What is your reason for calling today?" or "How can I best help you?" or "What question do you have that I can help answer?"     Medication with Covid 19.  Protocols used: INFORMATION ONLY CALL - NO TRIAGE-A-AH

## 2019-03-31 ENCOUNTER — Telehealth: Payer: Self-pay | Admitting: Internal Medicine

## 2019-03-31 NOTE — Telephone Encounter (Signed)
Copied from [place] [id number]. Topic: General - Other >> Mar 31, 2019  1:39 PM Keene Breath wrote: Reason for CRM: Patient would like the nurse to call her regarding her visit to the ER this week.  Patient stated that she has a few questions.  CB#[phone]

## 2019-03-31 NOTE — Telephone Encounter (Signed)
Pt states the meclizine made her feel worse and gave her a bad headache. Is there anything else she can take or anything else you recommend?

## 2019-03-31 NOTE — Telephone Encounter (Signed)
Okay.  Thanks.

## 2019-03-31 NOTE — Addendum Note (Signed)
Addended by: Cassandria Anger on: 03/31/2019 08:39 AM   Modules accepted: Orders

## 2019-04-01 ENCOUNTER — Other Ambulatory Visit: Payer: Self-pay | Admitting: Internal Medicine

## 2019-04-01 DIAGNOSIS — R202 Paresthesia of skin: Secondary | ICD-10-CM

## 2019-04-01 DIAGNOSIS — D649 Anemia, unspecified: Secondary | ICD-10-CM

## 2019-04-01 DIAGNOSIS — R42 Dizziness and giddiness: Secondary | ICD-10-CM

## 2019-04-01 NOTE — Telephone Encounter (Signed)
Diazepam should help if she has leftovers from February. Thanks

## 2019-04-03 MED ORDER — DIAZEPAM 2 MG PO TABS: 2.0000 mg | ORAL_TABLET | Freq: Three times a day (TID) | ORAL | 1 refills | Status: DC | PRN

## 2019-04-03 NOTE — Telephone Encounter (Signed)
Pt states she has a couple left but would like a refill sent to pharmacy. Please advise

## 2019-04-03 NOTE — Telephone Encounter (Signed)
Pt.notified

## 2019-04-03 NOTE — Telephone Encounter (Signed)
Done. Thanks.

## 2019-04-04 DIAGNOSIS — G43809 Other migraine, not intractable, without status migrainosus: Secondary | ICD-10-CM | POA: Insufficient documentation

## 2019-06-06 ENCOUNTER — Ambulatory Visit: Payer: No Typology Code available for payment source | Admitting: Family Medicine

## 2019-06-06 ENCOUNTER — Ambulatory Visit (INDEPENDENT_AMBULATORY_CARE_PROVIDER_SITE_OTHER): Payer: No Typology Code available for payment source | Admitting: Internal Medicine

## 2019-06-06 ENCOUNTER — Ambulatory Visit: Payer: Self-pay

## 2019-06-06 ENCOUNTER — Encounter: Payer: Self-pay | Admitting: Internal Medicine

## 2019-06-06 ENCOUNTER — Other Ambulatory Visit: Payer: Self-pay

## 2019-06-06 VITALS — BP 112/66 | HR 48 | Ht 61.0 in | Wt 121.0 lb

## 2019-06-06 VITALS — BP 112/66 | HR 48 | Temp 98.0°F | Ht 61.0 in | Wt 121.0 lb

## 2019-06-06 DIAGNOSIS — R202 Paresthesia of skin: Secondary | ICD-10-CM | POA: Diagnosis not present

## 2019-06-06 DIAGNOSIS — M545 Low back pain, unspecified: Secondary | ICD-10-CM

## 2019-06-06 DIAGNOSIS — M7712 Lateral epicondylitis, left elbow: Secondary | ICD-10-CM | POA: Diagnosis not present

## 2019-06-06 DIAGNOSIS — Z0000 Encounter for general adult medical examination without abnormal findings: Secondary | ICD-10-CM | POA: Diagnosis not present

## 2019-06-06 DIAGNOSIS — R42 Dizziness and giddiness: Secondary | ICD-10-CM

## 2019-06-06 DIAGNOSIS — M25522 Pain in left elbow: Secondary | ICD-10-CM

## 2019-06-06 DIAGNOSIS — M7710 Lateral epicondylitis, unspecified elbow: Secondary | ICD-10-CM | POA: Insufficient documentation

## 2019-06-06 DIAGNOSIS — D649 Anemia, unspecified: Secondary | ICD-10-CM | POA: Diagnosis not present

## 2019-06-06 LAB — CBC WITH DIFFERENTIAL/PLATELET
Basophils Absolute: 0 10*3/uL (ref 0.0–0.1)
Basophils Relative: 1 % (ref 0.0–3.0)
Eosinophils Absolute: 0.1 10*3/uL (ref 0.0–0.7)
Eosinophils Relative: 2 % (ref 0.0–5.0)
HCT: 38.3 % (ref 36.0–46.0)
Hemoglobin: 12.7 g/dL (ref 12.0–15.0)
Lymphocytes Relative: 42.7 % (ref 12.0–46.0)
Lymphs Abs: 1.7 10*3/uL (ref 0.7–4.0)
MCHC: 33.2 g/dL (ref 30.0–36.0)
MCV: 98.2 fl (ref 78.0–100.0)
Monocytes Absolute: 0.3 10*3/uL (ref 0.1–1.0)
Monocytes Relative: 8 % (ref 3.0–12.0)
Neutro Abs: 1.9 10*3/uL (ref 1.4–7.7)
Neutrophils Relative %: 46.3 % (ref 43.0–77.0)
Platelets: 152 10*3/uL (ref 150.0–400.0)
RBC: 3.9 Mil/uL (ref 3.87–5.11)
RDW: 13.4 % (ref 11.5–15.5)
WBC: 4.1 10*3/uL (ref 4.0–10.5)

## 2019-06-06 LAB — HEPATIC FUNCTION PANEL
ALT: 15 U/L (ref 0–35)
AST: 22 U/L (ref 0–37)
Albumin: 4 g/dL (ref 3.5–5.2)
Alkaline Phosphatase: 43 U/L (ref 39–117)
Bilirubin, Direct: 0.1 mg/dL (ref 0.0–0.3)
Total Bilirubin: 0.5 mg/dL (ref 0.2–1.2)
Total Protein: 6.6 g/dL (ref 6.0–8.3)

## 2019-06-06 LAB — BASIC METABOLIC PANEL
BUN: 22 mg/dL (ref 6–23)
CO2: 29 mEq/L (ref 19–32)
Calcium: 9.1 mg/dL (ref 8.4–10.5)
Chloride: 102 mEq/L (ref 96–112)
Creatinine, Ser: 1.01 mg/dL (ref 0.40–1.20)
GFR: 55.07 mL/min — ABNORMAL LOW (ref >60.00)
Glucose, Bld: 99 mg/dL (ref 70–99)
Potassium: 4.3 mEq/L (ref 3.5–5.1)
Sodium: 136 mEq/L (ref 135–145)

## 2019-06-06 LAB — VITAMIN B12: Vitamin B-12: 480 pg/mL (ref 211–911)

## 2019-06-06 LAB — TSH: TSH: 2.37 u[IU]/mL (ref 0.35–4.50)

## 2019-06-06 LAB — VITAMIN D 25 HYDROXY (VIT D DEFICIENCY, FRACTURES): VITD: 38.63 ng/mL (ref 30.00–100.00)

## 2019-06-06 MED ORDER — MELOXICAM 7.5 MG PO TABS: 7.5000 mg | ORAL_TABLET | Freq: Every day | ORAL | 1 refills | Status: DC | PRN

## 2019-06-06 MED ORDER — DIAZEPAM 2 MG PO TABS: 2.0000 mg | ORAL_TABLET | Freq: Three times a day (TID) | ORAL | 1 refills | Status: DC | PRN

## 2019-06-06 MED ORDER — CYCLOBENZAPRINE HCL 5 MG PO TABS: 5.0000 mg | ORAL_TABLET | Freq: Three times a day (TID) | ORAL | 1 refills | Status: DC | PRN

## 2019-06-06 MED ORDER — CIPROFLOXACIN HCL 250 MG PO TABS: 250.0000 mg | ORAL_TABLET | Freq: Two times a day (BID) | ORAL | 0 refills | Status: DC

## 2019-06-06 MED ORDER — EPINEPHRINE 0.3 MG/0.3ML IJ SOAJ: 0.3000 mg | INTRAMUSCULAR | 3 refills | Status: DC | PRN

## 2019-06-06 NOTE — Patient Instructions (Signed)
Thank you for coming in today. Add Voltaren Gel.  Adjust the counter force to the forearm a bit.  Do the exercises we discussed. Remember to keep the elbow straight.  Consider PT.  If not better we could do PRP injection.

## 2019-06-06 NOTE — Progress Notes (Signed)
I, Jream Poet, LAT, ATC, am serving as scribe for Dr. Lynne Leader.  Karen [name] is a 64 y.o. female who presents to [place] [facility] today for L elbow at the L lateral epicondyle and forearm pain since December 2020 from playing tennis.  She was last seen by Dr. Raeford Razor on 12/09/16 for low back pain.  She rates her L elbow pain at a 9/10 and describes her pain as sharp at it's worst.  Radiating pain: minimally yes into L proximal forearm and distal upper arm Swelling: No L forearm/elbow weakness: Yes L UE numbness/tingling: No Aggravating factors: Playing tennis; all ADLs and IADLs Treatments tried: rest, ice, moist heat, elbow sleeve, tennis elbow strap w/ and w/o magnets; Advil; Meloxicam; stretching/strengthening as prescribed by a PT; cross-friction massage Physical therapy has all been done virtually.   Pertinent review of systems: No fevers or chills.   Relevant historical information: History acute appendicitis   Exam:  BP 112/66 (BP Location: Right Arm, Patient Position: Sitting, Cuff Size: Normal)   Pulse (!) 48   Ht 5\' 1"  (1.549 m)   Wt 121 lb (54.9 kg)   SpO2 96%   BMI 22.86 kg/m  General: Well Developed, well nourished, and in no acute distress.   MSK: Left elbow: Normal-appearing no significant deformity. Normal motion. Normal strength. Tender palpation at lateral epicondyle. Pain with resisted wrist and finger extension.   Pulses cap refill and sensation are intact distally.    Lab and Radiology Results  Limited musculoskeletal ultrasound left lateral elbow. Lateral epicondyle with trace calcification seen at insertion of common extensor tendon. No obvious tear or tendon defect present. No significant degenerative changes seen. Impression: Lateral epicondylitis     Assessment and Plan: 64 y.o. female with  Left lateral elbow pain.  Patient has bothersome lateral elbow pain due to lateral epicondylitis.  She has had a good  trial of conservative management at this point with little benefit.  She would benefit from in person physical therapy which is not a possibility right now due to COVID-19.  Retaught home exercise program in clinic today emphasis on keeping her elbow straight.  Additionally discussed other options.  Patient cannot tolerate nitroglycerin patches due to an allergy to adhesives.  Will use Voltaren gel.  Discussed other options including trial of injection.  Doubtful that cortisone would be much helpful at this point.  Discussed trial of PRP in the future if not better.  Check back in about a month.  [id number]; 15 additional minutes spent for Therapeutic exercises as stated in above notes.  This included exercises focusing on stretching, strengthening, with significant focus on eccentric aspects.   Long term goals include an improvement in range of motion, strength, endurance as well as avoiding reinjury. Patient's frequency would include in 1-2 times a day, 3-5 times a week for a duration of 6-12 weeks.  Proper technique shown and discussed handout in great detail with ATC.  All questions were discussed and answered.    PDMP not reviewed this encounter. Orders Placed This Encounter  Procedures  . [place] LIMITED JOINT SPACE STRUCTURES UP LEFT(NO LINKED CHARGES)    Order Specific Question:   Reason for Exam (SYMPTOM  OR DIAGNOSIS REQUIRED)    Answer:   L elbow pain    Order Specific Question:   Preferred imaging location?    Answer:   [place]   No orders of the defined types were placed in this encounter.  Discussed warning signs or symptoms. Please see discharge instructions. Patient expresses understanding.   The above documentation has been reviewed and is accurate and complete [name]    Total encounter time 45 minutes including charting time date of service independent of separately billed ATC time.

## 2019-06-06 NOTE — Patient Instructions (Signed)
TheraBand FlexBar for tennis elbow

## 2019-06-06 NOTE — Progress Notes (Signed)
Subjective:  Patient ID: Karen [name], female    DOB: 1956/02/13  Age: 64 y.o. MRN: [id number]  CC: No chief complaint on file.   HPI Karen [name] presents for a well exam C/o LBP - had PT In Dec  89 yo mother had COVID in [place] left ebow pain  - severe - Jan 6th - can't plat tennis.  She is left handed.  Per ER note - acute vertigo on 03/29/19: "This is a 64 year old female with a history of arthritis who presents with dizziness and abnormal gait. Patient reports that she was walking this morning. She reports that she normally walks approximately 10 miles per day. She states she is very active and healthy. This morning she was on her walk when she had acute onset of dizziness. She describes it as lightheadedness. She states that at that time she noted that she "could not walk in a straight line." She states that she was listing to the left into the right. She fell onto the grass. Her husband came to pick her up. She states that she has had persistent lightheadedness and nausea throughout the day. She also reports a dull headache. She has never had anything like this before. No history of vertigo. Denies any recent upper respiratory symptoms or fevers. She does state that her symptoms worsen with position changes. No known history of hypertension, hyperlipidemia, atrial fibrillation.      Past Medical History:  Diagnosis Date  . Arthritis    right  . Climacteric    on HRT  . Complication of anesthesia   . Epiglottitis 1959   tracheotomy  . Finger pain    pain with pressure on the PIP joint with hemorrhage and swelling. MRI hand inconclusive   . History of varicella   . Lumbar back pain 1999    initial strain. Intermittent pain since. Re-injured Dec '11. Had PT IN THE PAST.  Marland Kitchen Plantar fasciitis   . Pneumonia 1993   hospitalized  . PONV (postoperative nausea and vomiting)        Patient Active Problem List   Diagnosis Date Noted  . Knee pain 12/28/2018  . Onychomycosis  06/02/2018  . Cystitis 03/29/2018  . Elevated LFTs 03/29/2018  . Elevated serum creatinine 02/14/2018  . Fatigue 01/28/2018  . Toe pain 01/28/2018  . Acute perforated appendicitis 08/26/2017  . Perforated appendicitis 08/26/2017  . Upper airway cough syndrome 10/16/2016  . Asthmatic bronchitis 07/31/2016  . Microhematuria 06/04/2016  . Well adult exam 05/27/2016  . Paresthesia 05/27/2016  . Tinea pedis 05/27/2016  . Laryngitis 03/24/2016  . Pain in joint, shoulder region 03/24/2016  . Chest pain 01/01/2016  . Allergic rhinitis 08/06/2015  . Allergic conjunctivitis 08/06/2015  . Grief 08/06/2015  . Anaphylactic reaction 05/02/2015  . Screen for colon cancer 05/02/2015  . Muscle spasms of neck 10/14/2014  . Left groin pain 01/26/2012  . Foot cramps 01/26/2012  . Pneumonia, organism unspecified(486) 08/04/2011  . Healthcare maintenance 11/23/2010  . Arthritis   . Lumbar back pain   . Finger pain   . Surgical menopause         Past Surgical History:  Procedure Laterality Date  . ABDOMINAL HYSTERECTOMY     endometriosis  . APPENDECTOMY  08/26/2017  . ARTHROSCOPIC REPAIR ACL  1995   right-reconstructed with patellar tendon  . FOOT SURGERY  1999   left foot-arch ligament release (metatarsal release ?)  . LAPAROSCOPIC APPENDECTOMY N/A 08/26/2017   Procedure: APPENDECTOMY  LAPAROSCOPIC; Surgeon: Gaynelle Adu, MD; Location: [facility] OR; Service: General; Laterality: N/A;  . MENISCUS REPAIR  1975   right medial/ open  . REDUCTION MAMMAPLASTY Bilateral 1976  . reduction mammoplasty  1975  . TOTAL ABDOMINAL HYSTERECTOMY W/ BILATERAL SALPINGOOPHORECTOMY  '02   OB History   No obstetric history on file.     Home Medications           Prior to Admission medications   Medication Sig Start Date End Date Taking? Authorizing Provider  Calcium-Vitamin D-Vitamin K (VIACTIV PO) Take 1 tablet by mouth 2 (two) times daily. CHEW   Yes [provider]  cyclobenzaprine (FLEXERIL) 5 MG  tablet Take 1 tablet (5 mg total) by mouth 3 (three) times daily as needed for muscle spasms. 06/02/18  Yes Zakia Sainato, Georgina Quint, MD  diazepam (VALIUM) 2 MG tablet Take 1 tablet (2 mg total) by mouth every 8 (eight) hours as needed for anxiety. 06/02/18  Yes Elize Pinon, Georgina Quint, MD  diclofenac sodium (VOLTAREN) 1 % GEL Apply 4 g topically 4 (four) times daily.  Patient taking differently: Apply 4 g topically See admin instructions. Apply 4 grams to the right knee four times daily 12/28/18  Yes Edwyn Inclan, Georgina Quint, MD  diphenhydrAMINE (BENADRYL) 50 MG tablet Take 1 tablet (50 mg total) by mouth every 6 (six) hours as needed for itching or allergies. 05/28/17  Yes Elonzo Sopp, Georgina Quint, MD  docusate sodium (COLACE) 100 MG capsule Take 1 capsule (100 mg total) by mouth 2 (two) times daily.  Patient taking differently: Take 100 mg by mouth See admin instructions. Take 100 mg by mouth one to two times daily 09/01/17  Yes Meuth, Brooke A, PA-C  estrogen-methylTESTOSTERone 0.625-1.25 MG per tablet Take 1 tablet by mouth daily. 02/02/14  Yes Baxley, Luanna Cole, MD  fexofenadine (ALLEGRA) 180 MG tablet Take 1 tablet (180 mg total) by mouth daily. 05/28/17  Yes Ameli Sangiovanni, Georgina Quint, MD  hydrocortisone 2.5 % cream Apply 1 application topically 2 (two) times daily as needed (for rashes).    Yes [provider]  Multiple Vitamin (MULTIVITAMIN WITH MINERALS) TABS tablet Take 1 tablet by mouth daily.   Yes [provider]  olopatadine (PATANOL) 0.1 % ophthalmic solution Place 1 drop into both eyes daily as needed for allergies.    Yes [provider]  polyethylene glycol (MIRALAX / GLYCOLAX) packet Take 17 g by mouth daily as needed for moderate constipation.  Patient taking differently: Take 17 g by mouth daily as needed for moderate constipation (MIX AND DRINK).  09/01/17  Yes Meuth, Brooke A, PA-C  pseudoephedrine (SUDAFED) 60 MG tablet Take 1 tablet (60 mg total) by mouth every 4 (four) hours as  needed (allergic reaction). 06/01/17  Yes Patryce Depriest, Georgina Quint, MD  vitamin C (ASCORBIC ACID) 500 MG tablet Take 500 mg by mouth daily.    Yes [provider]  ciclopirox (PENLAC) 8 % solution Apply topically at bedtime. Apply over nail and surrounding skin. Apply daily over previous coat. After seven (7) days, may remove with alcohol and continue cycle.  Patient not taking: Reported on 03/29/2019 06/02/18   Jaidee Stipe, Georgina Quint, MD  ciprofloxacin (CIPRO) 250 MG tablet Take 1 tablet (250 mg total) by mouth 2 (two) times daily.  Patient not taking: Reported on 03/29/2019 06/02/18   Ottie Tillery, Georgina Quint, MD  EPINEPHrine (EPI-PEN) 0.3 mg/0.3 mL DEVI Inject 0.3 mg into the muscle once as needed (anaphylaxis).     [provider]  erythromycin  ophthalmic ointment Place 1 application into both eyes at bedtime.  Patient not taking: Reported on 03/29/2019 08/06/15   Tait Balistreri, Georgina Quint, MD  fluticasone (FLONASE) 50 MCG/ACT nasal spray Place 2 sprays into both nostrils daily.  Patient not taking: Reported on 03/29/2019 08/06/15   Fender Herder, Georgina Quint, MD  meclizine (ANTIVERT) 25 MG tablet Take 1 tablet (25 mg total) by mouth 3 (three) times daily as needed for dizziness. 03/29/19   Horton, Mayer Masker, MD  ondansetron (ZOFRAN ODT) 4 MG disintegrating tablet Take 1 tablet (4 mg total) by mouth every 8 (eight) hours as needed for nausea or vomiting. 03/29/19   Horton, Mayer Masker, MD   Family History       Family History  Problem Relation Age of Onset  . Hypertension Mother   . Heart disease Father    CABG  . Hyperlipidemia Father   . Hypertension Father   . Obesity Father   . Gout Father   . Alcohol abuse Father   . HIV Brother   . Cancer Neg Hx   . Diabetes Neg Hx    Social History  Social History       Tobacco Use  . Smoking status: Never Smoker  . Smokeless tobacco: Never Used  Substance Use Topics  . Alcohol use: No  . Drug use: No   Allergies   Penicillins, Adhesive [tape],  Hydrocodone, and Singulair [montelukast sodium]  Review of Systems  Review of Systems  Constitutional: Negative for fever.  Respiratory: Negative for shortness of breath.  Cardiovascular: Negative for chest pain.  Gastrointestinal: Positive for nausea. Negative for abdominal pain and vomiting.  Musculoskeletal: Positive for gait problem.  Neurological: Positive for dizziness. Negative for speech difficulty, weakness and numbness.  All other systems reviewed and are negative.   Physical Exam  Updated Vital Signs  BP 104/62  Pulse (!) 57  Temp 98.1 F (36.7 C) (Oral)  Resp 13  SpO2 100%  Physical Exam  Vitals signs and nursing note reviewed.  Constitutional:  Appearance: She is well-developed. She is not ill-appearing.  HENT:  Head: Normocephalic and atraumatic.  Mouth/Throat:  Mouth: Mucous membranes are moist.  Eyes:  Pupils: Pupils are equal, round, and reactive to light.  Neck:  Musculoskeletal: Neck supple.  Cardiovascular:  Rate and Rhythm: Regular rhythm. Bradycardia present.  Heart sounds: Normal heart sounds.  Pulmonary:  Effort: Pulmonary effort is normal. No respiratory distress.  Breath sounds: No wheezing.  Abdominal:  General: Bowel sounds are normal.  Palpations: Abdomen is soft.  Musculoskeletal:  Right lower leg: No edema.  Left lower leg: No edema.  Skin:  General: Skin is warm and dry.  Neurological:  Mental Status: She is alert and oriented to person, place, and time.  Comments: Fluent speech, cranial nerves II through XII intact, no dysmetria to finger-nose-finger, positive Romberg, patient with normal regular gait, difficulty with tandem gait, no ataxia noted  Psychiatric:  Mood and Affect: Mood normal.  Left lateral elbow  epicondyle is tender to palpation  ED Treatments / Results  Labs  (all labs ordered are listed, but only abnormal results are displayed)       Labs Reviewed  CBC - Abnormal; Notable for the following components:  Result  Value    RBC 3.58 (*)    Hemoglobin 11.9 (*)    HCT 35.0 (*)    All other components within normal limits  COMPREHENSIVE METABOLIC PANEL - Abnormal; Notable for the following components:   Sodium 132 (*)  Glucose, Bld 111 (*)    Creatinine, Ser 1.07 (*)    Calcium 8.6 (*)    Total Protein 6.3 (*)    GFR calc non Af Amer 55 (*)    All other components within normal limits  I-STAT CHEM 8, ED - Abnormal; Notable for the following components:   Sodium 132 (*)    Creatinine, Ser 1.10 (*)    Glucose, Bld 104 (*)    Calcium, Ion 1.12 (*)    Hemoglobin 11.9 (*)    HCT 35.0 (*)    All other components within normal limits  PROTIME-INR  APTT  DIFFERENTIAL  CBG MONITORING, ED   EKG  EKG Interpretation  Date/Time: Tuesday March 28 2019 19:10:00 EST  Ventricular Rate: 48  PR Interval: 140  QRS Duration: 70  QT Interval: 502  QTC Calculation: 448  R Axis: 70  Text Interpretation: Sinus bradycardia Possible Anterior infarct , age undetermined Abnormal ECG No significant change since last tracing Confirmed by Ross MarcusHorton, Courtney ([id number]) on 03/29/2019 2:09:46 AM   Radiology  Imaging Results (Last 48 hours)    Procedures  Procedures (including critical care time)  Medications Ordered in ED  Medications  sodium chloride flush (NS) 0.9 % injection 3 mL (3 mLs Intravenous Given 03/29/19 0155)  sodium chloride 0.9 % bolus 1,000 mL (0 mLs Intravenous Stopped 03/29/19 0411)  meclizine (ANTIVERT) tablet 25 mg (25 mg Oral Given 03/29/19 0240)  ondansetron (ZOFRAN) injection 4 mg (4 mg Intravenous Given 03/29/19 0235)   Initial Impression / Assessment and Plan / ED Course  I have reviewed the triage vital signs and the nursing notes.  Pertinent labs & imaging results that were available during my care of the patient were reviewed by me and considered in my medical decision making (see chart for details).    Patient presents with vertiginous symptoms. Onset of symptoms at 9 AM yesterday.  Persistent and indolent throughout the day. She is overall nontoxic and vital signs are largely reassuring. She is bradycardic in the 50s but walks up to 10 miles a day and is otherwise asymptomatic. On neurologic exam she has no dysmetria. She does have a slightly positive Romberg and is somewhat unsteady with tandem gait. For this and given reports of gait disturbance earlier yesterday, will obtain MRI to rule out acute stroke as a central cause of vertigo. There are characteristics of her dizziness that are more suggestive of peripheral vertigo including positional change and nausea. Patient was treated with meclizine and Zofran. MRI reviewed by myself and is negative for acute stroke. Patient is ambulatory independently the bathroom and has had improvement of symptoms. I discussed the results with her at length. She has an ENT she can follow-up with. Will discharge with meclizine and Zofran.  After history, exam, and medical workup I feel the patient has been appropriately medically screened and is safe for discharge home. Pertinent diagnoses were discussed with the patient. Patient was given return precautions".  Toniann FailWendy f/u w/Dr Pollyann Kennedyosen - ok  Outpatient Medications Prior to Visit  Medication Sig Dispense Refill  . Calcium-Vitamin D-Vitamin K (VIACTIV PO) Take 1 tablet by mouth 2 (two) times daily. CHEW    . cyclobenzaprine (FLEXERIL) 5 MG tablet Take 1 tablet (5 mg total) by mouth 3 (three) times daily as needed for muscle spasms. 90 tablet 1  . diazepam (VALIUM) 2 MG tablet Take 1 tablet (2 mg total) by mouth every 8 (eight) hours as needed for anxiety. 60 tablet 1  .  diclofenac sodium (VOLTAREN) 1 % GEL Apply 4 g topically 4 (four) times daily. (Patient taking differently: Apply 4 g topically See admin instructions. Apply 4 grams to the right knee four times daily) 100 g 3  . diphenhydrAMINE (BENADRYL) 50 MG tablet Take 1 tablet (50 mg total) by mouth every 6 (six) hours as needed for itching or  allergies. 30 tablet 1  . docusate sodium (COLACE) 100 MG capsule Take 1 capsule (100 mg total) by mouth 2 (two) times daily. (Patient taking differently: Take 100 mg by mouth See admin instructions. Take 100 mg by mouth one to two times daily) 10 capsule 0  . EPINEPHrine (EPI-PEN) 0.3 mg/0.3 mL DEVI Inject 0.3 mg into the muscle once as needed (anaphylaxis).     Marland Kitchen estrogen-methylTESTOSTERone 0.625-1.25 MG per tablet Take 1 tablet by mouth daily. 90 tablet 3  . fexofenadine (ALLEGRA) 180 MG tablet Take 1 tablet (180 mg total) by mouth daily. 100 tablet 3  . hydrocortisone 2.5 % cream Apply 1 application topically 2 (two) times daily as needed (for rashes).     . meclizine (ANTIVERT) 25 MG tablet Take 1 tablet (25 mg total) by mouth 3 (three) times daily as needed for dizziness. 30 tablet 0  . meloxicam (MOBIC) 7.5 MG tablet Take 7.5 mg by mouth daily as needed for pain.    . Multiple Vitamin (MULTIVITAMIN WITH MINERALS) TABS tablet Take 1 tablet by mouth daily.    Marland Kitchen olopatadine (PATANOL) 0.1 % ophthalmic solution Place 1 drop into both eyes daily as needed for allergies.     Marland Kitchen ondansetron (ZOFRAN ODT) 4 MG disintegrating tablet Take 1 tablet (4 mg total) by mouth every 8 (eight) hours as needed for nausea or vomiting. 20 tablet 0  . polyethylene glycol (MIRALAX / GLYCOLAX) packet Take 17 g by mouth daily as needed for moderate constipation. (Patient taking differently: Take 17 g by mouth daily as needed for moderate constipation (MIX AND DRINK). ) 14 each 0  . pseudoephedrine (SUDAFED) 60 MG tablet Take 1 tablet (60 mg total) by mouth every 4 (four) hours as needed (allergic reaction). 60 tablet 1  . vitamin C (ASCORBIC ACID) 500 MG tablet Take 500 mg by mouth daily.      . ciclopirox (PENLAC) 8 % solution Apply topically at bedtime. Apply over nail and surrounding skin. Apply daily over previous coat. After seven (7) days, may remove with alcohol and continue cycle. (Patient not taking: Reported on  03/29/2019) 6.6 mL 0  . ciprofloxacin (CIPRO) 250 MG tablet Take 1 tablet (250 mg total) by mouth 2 (two) times daily. (Patient not taking: Reported on 03/29/2019) 10 tablet 0  . erythromycin ophthalmic ointment Place 1 application into both eyes at bedtime. (Patient not taking: Reported on 03/29/2019) 3.5 g 0  . fluticasone (FLONASE) 50 MCG/ACT nasal spray Place 2 sprays into both nostrils daily. (Patient not taking: Reported on 03/29/2019) 16 g 6   No facility-administered medications prior to visit.    ROS: Review of Systems  Constitutional: Negative for activity change, appetite change, chills, fatigue and unexpected weight change.  HENT: Negative for congestion, mouth sores and sinus pressure.   Eyes: Negative for visual disturbance.  Respiratory: Negative for cough and chest tightness.   Gastrointestinal: Negative for abdominal pain and nausea.  Genitourinary: Negative for difficulty urinating, frequency and vaginal pain.  Musculoskeletal: Positive for arthralgias. Negative for back pain and gait problem.  Skin: Negative for pallor and rash.  Neurological: Negative for dizziness,  tremors, weakness, numbness and headaches.  Psychiatric/Behavioral: Negative for confusion and sleep disturbance. The patient is nervous/anxious.     Objective:  BP 112/66 (BP Location: Left Arm, Patient Position: Sitting, Cuff Size: Normal)   Pulse (!) 48   Temp 98 F (36.7 C) (Oral)   Ht 5\' 1"  (1.549 m)   Wt 121 lb (54.9 kg)   SpO2 96%   BMI 22.86 kg/m   BP Readings from Last 3 Encounters:  06/06/19 112/66  03/29/19 115/73  12/28/18 102/64    Wt Readings from Last 3 Encounters:  06/06/19 121 lb (54.9 kg)  12/28/18 118 lb (53.5 kg)  06/02/18 113 lb (51.3 kg)    Physical Exam Constitutional:      General: She is not in acute distress.    Appearance: She is well-developed.  HENT:     Head: Normocephalic.     Right Ear: External ear normal.     Left Ear: External ear normal.     Nose:  Nose normal.  Eyes:     General:        Right eye: No discharge.        Left eye: No discharge.     Conjunctiva/sclera: Conjunctivae normal.     Pupils: Pupils are equal, round, and reactive to light.  Neck:     Thyroid: No thyromegaly.     Vascular: No JVD.     Trachea: No tracheal deviation.  Cardiovascular:     Rate and Rhythm: Normal rate and regular rhythm.     Heart sounds: Normal heart sounds.  Pulmonary:     Effort: No respiratory distress.     Breath sounds: No stridor. No wheezing.  Abdominal:     General: Bowel sounds are normal. There is no distension.     Palpations: Abdomen is soft. There is no mass.     Tenderness: There is no abdominal tenderness. There is no guarding or rebound.  Musculoskeletal:        General: Tenderness present.     Cervical back: Normal range of motion and neck supple.  Lymphadenopathy:     Cervical: No cervical adenopathy.  Skin:    Findings: No erythema or rash.  Neurological:     Cranial Nerves: No cranial nerve deficit.     Motor: No abnormal muscle tone.     Coordination: Coordination normal.     Deep Tendon Reflexes: Reflexes normal.  Psychiatric:        Behavior: Behavior normal.        Thought Content: Thought content normal.        Judgment: Judgment normal.   Left lateral elbow is painful to palpation  Lab Results  Component Value Date   WBC 4.9 03/28/2019   HGB 11.9 (L) 03/28/2019   HCT 35.0 (L) 03/28/2019   PLT 172 03/28/2019   GLUCOSE 104 (H) 03/28/2019   CHOL 202 (H) 12/28/2018   TRIG 31.0 12/28/2018   HDL 93.20 12/28/2018   LDLDIRECT 134.0 01/25/2012   LDLCALC 103 (H) 12/28/2018   ALT 22 03/28/2019   AST 31 03/28/2019   NA 132 (L) 03/28/2019   K 3.9 03/28/2019   CL 99 03/28/2019   CREATININE 1.10 (H) 03/28/2019   BUN 19 03/28/2019   CO2 26 03/28/2019   TSH 2.13 12/28/2018   INR 1.0 03/28/2019    CT HEAD WO CONTRAST  Result Date: 03/28/2019 CLINICAL DATA:  64 year old female with dizziness,  lightheadedness and headache, unable to walk in a straight  line since this morning. EXAM: CT HEAD WITHOUT CONTRAST TECHNIQUE: Contiguous axial images were obtained from the base of the skull through the vertex without intravenous contrast. COMPARISON:  None. FINDINGS: Brain: No evidence of acute infarction, hemorrhage, hydrocephalus, extra-axial collection or mass lesion/mass effect. Vascular: No hyperdense vessel or unexpected calcification. Skull: Normal. Negative for fracture or focal lesion. Sinuses/Orbits: No acute finding. Other: None. IMPRESSION: 1. No acute intracranial abnormalities. The appearance of the brain is normal. Electronically Signed   By: Trudie Reedaniel  Entrikin M.D.   On: 03/28/2019 19:48   MR BRAIN WO CONTRAST  Result Date: 03/29/2019 CLINICAL DATA:  Sudden onset dizziness with lightheadedness. EXAM: MRI HEAD WITHOUT CONTRAST TECHNIQUE: Multiplanar, multiecho pulse sequences of the brain and surrounding structures were obtained without intravenous contrast. COMPARISON:  Head CT from yesterday FINDINGS: Brain: No acute infarction, hemorrhage, hydrocephalus, extra-axial collection or mass lesion. Single, small and chronic left inferior frontal white matter insult, nonspecific and commonly seen by patient age. Normal brain volume. Vascular: Normal flow voids, including vertebrobasilar Skull and upper cervical spine: Negative for marrow lesion. Right-sided facet spurring in the visible upper cervical spine. Sinuses/Orbits: Negative.  No mastoid or middle ear opacification IMPRESSION: Negative for infarct or other cause of symptoms. Electronically Signed   By: Marnee SpringJonathon  Watts M.D.   On: 03/29/2019 06:04    Assessment & Plan:   There are no diagnoses linked to this encounter.   No orders of the defined types were placed in this encounter.    Follow-up: No follow-ups on file.  Sonda PrimesAlex Skarlette Lattner, MD

## 2019-06-06 NOTE — Assessment & Plan Note (Signed)
12/20 acute - ?vertiginous migraine Brain MRI nl, saw Dr Pollyann Kennedy

## 2019-06-06 NOTE — Assessment & Plan Note (Signed)
We discussed age appropriate health related issues, including available/recomended screening tests and vaccinations. We discussed a need for adhering to healthy diet and exercise. Labs were ordered to be later reviewed . All questions were answered. Not eating red meat

## 2019-06-06 NOTE — Assessment & Plan Note (Signed)
L elbow - L handed TheraBand FlexBar for tennis elbow Voltaren gel Sports med ref

## 2019-06-07 ENCOUNTER — Encounter: Payer: Self-pay | Admitting: Internal Medicine

## 2019-06-07 DIAGNOSIS — N189 Chronic kidney disease, unspecified: Secondary | ICD-10-CM | POA: Insufficient documentation

## 2019-06-07 LAB — IRON,TIBC AND FERRITIN PANEL
%SAT: 35 % (calc) (ref 16–45)
Ferritin: 29 ng/mL (ref 16–288)
Iron: 106 ug/dL (ref 45–160)
TIBC: 302 mcg/dL (calc) (ref 250–450)

## 2019-06-09 ENCOUNTER — Encounter: Payer: Self-pay | Admitting: Internal Medicine

## 2019-06-24 ENCOUNTER — Ambulatory Visit: Payer: No Typology Code available for payment source | Attending: Internal Medicine

## 2019-06-24 DIAGNOSIS — Z23 Encounter for immunization: Secondary | ICD-10-CM

## 2019-06-24 NOTE — Progress Notes (Signed)
   Covid-19 Vaccination Clinic  Name:  Karen [name]    MRN: [id number] DOB: November 18, 1955  06/24/2019  Karen [name] was observed post Covid-19 immunization for 15 minutes without incident. She was provided with Vaccine Information Sheet and instruction to access the V-Safe system.   Karen [name] was instructed to call 911 with any severe reactions post vaccine: Marland Kitchen Difficulty breathing  . Swelling of face and throat  . A fast heartbeat  . A bad rash all over body  . Dizziness and weakness   Immunizations Administered    Name Date Dose VIS Date Route   Pfizer COVID-19 Vaccine 06/24/2019 10:32 AM 0.3 mL 03/31/2019 Intramuscular   Manufacturer: [employer], [employer]   Lot: [id number]   NDC: [address]-9

## 2019-07-04 ENCOUNTER — Ambulatory Visit: Payer: No Typology Code available for payment source | Admitting: Family Medicine

## 2019-07-04 ENCOUNTER — Ambulatory Visit (INDEPENDENT_AMBULATORY_CARE_PROVIDER_SITE_OTHER): Payer: No Typology Code available for payment source

## 2019-07-04 ENCOUNTER — Other Ambulatory Visit: Payer: Self-pay

## 2019-07-04 DIAGNOSIS — M25522 Pain in left elbow: Secondary | ICD-10-CM

## 2019-07-04 DIAGNOSIS — M7712 Lateral epicondylitis, left elbow: Secondary | ICD-10-CM

## 2019-07-04 NOTE — Progress Notes (Signed)
I, Karen [name], LAT, ATC, am serving as scribe for Dr. [name].  Karen [name] is a 64 y.o. female who presents to [facility] at [facility] today for f/u of L lateral elbow pain since Dec. 2020 from playing tennis.  She was last seen by Dr. Denyse Amass on 06/06/19 and was provided w/ a HEP by Dr. Denyse Amass and advised to use Voltaren gel.  She has tried a variety of treatments including virtual PT, cross friction massage, tennis elbow strap, ice/heat, Advil, Meloxicam, etc.  Since her last visit, pt reports reduced pain. Pain was a 9/10 and now a 5/10.    Pertinent review of systems: No fevers or chills  Relevant historical information: Patient received her first of a series of 2 COVID-19 vaccines on March 6.   Exam:   General: Well Developed, well nourished, and in no acute distress.   MSK: Left elbow normal-appearing Normal elbow motion and strength to flexion extension pronation supination. Tender palpation at lateral epicondyle. Pain reproduced with resisted wrist extension.    Lab and Radiology Results  X-ray images left elbow obtained today personally and independently reviewed. No acute fractures significant degenerative changes or other abnormalities Await formal radiology review    Assessment and Plan: 64 y.o. female with left elbow pain ongoing for months. Patient had moderate improvement with home exercise program. At this point she has a few options. Discussed referral to physical therapy, MRI, or other injections such as PRP. She would like to pursue physical therapy but wants to be fully vaccinated first which would be mid April. She also would like to pursue an MRI as well. Obtain x-ray today as above which was normal and proceed with MRI and then unless we get surprised trial of physical therapy. Recheck after MRI.   PDMP not reviewed this encounter. Orders Placed This Encounter  Procedures  . DG ELBOW COMPLETE LEFT (3+VIEW)    Standing Status:    Future    Number of Occurrences:   1    Standing Expiration Date:   09/02/2020    Order Specific Question:   Reason for Exam (SYMPTOM  OR DIAGNOSIS REQUIRED)    Answer:   eval left elbow pain    Order Specific Question:   Preferred imaging location?    Answer:   [facility]    Order Specific Question:   Radiology Contrast Protocol - do NOT remove file path    Answer:   \\[email or web address]\Radiant\DXFluoroContrastProtocols.pdf  . MR ELBOW LEFT WO CONTRAST    Standing Status:   Future    Standing Expiration Date:   09/02/2020    Order Specific Question:   What is the patient's sedation requirement?    Answer:   No Sedation    Order Specific Question:   Does the patient have a pacemaker or implanted devices?    Answer:   No    Order Specific Question:   Preferred imaging location?    Answer:   [facility] (table limit-550lbs)    Order Specific Question:   Radiology Contrast Protocol - do NOT remove file path    Answer:   \\[email or web address]\[email or web address]\Radiant\[email or web address].PDF   No orders of the defined types were placed in this encounter.    Discussed warning signs or symptoms. Please see discharge instructions. Patient expresses understanding.   The above documentation has been reviewed and is accurate and complete [name]   Total encounter time 30 minutes including charting time date of service. Discussed next steps  treatment plan and options.

## 2019-07-04 NOTE — Patient Instructions (Signed)
Thank you for coming in today. Get xray now and MRI in April  I will get a PT recommendation.  Karen [name] is a good sports PT.   Recheck after MRI.  Have them make you a CD of your images when you get the MRI.

## 2019-07-05 ENCOUNTER — Telehealth: Payer: Self-pay

## 2019-07-05 NOTE — Telephone Encounter (Signed)
New message    The patient needs clarification on lab's my chart notation under health history chronic kidney disease and MD saying to drink more water.

## 2019-07-05 NOTE — Progress Notes (Signed)
X-ray elbow shows no fracture.  No significant arthritis.  MRI will be helpful to further characterize tennis elbow.

## 2019-07-06 NOTE — Telephone Encounter (Signed)
Spoke with pt and clarified lab results and notation under health history.

## 2019-07-15 ENCOUNTER — Ambulatory Visit: Payer: No Typology Code available for payment source | Attending: Internal Medicine

## 2019-07-15 DIAGNOSIS — Z23 Encounter for immunization: Secondary | ICD-10-CM

## 2019-07-15 NOTE — Progress Notes (Signed)
   Covid-19 Vaccination Clinic  Name:  Karen [name]    MRN: [id number] DOB: 1955/12/11  07/15/2019  Karen [name] was observed post Covid-19 immunization for 30 minutes based on pre-vaccination screening without incident. She was provided with Vaccine Information Sheet and instruction to access the V-Safe system.   Karen [name] was instructed to call 911 with any severe reactions post vaccine: Marland Kitchen Difficulty breathing  . Swelling of face and throat  . A fast heartbeat  . A bad rash all over body  . Dizziness and weakness   Immunizations Administered    Name Date Dose VIS Date Route   Pfizer COVID-19 Vaccine 07/15/2019  9:10 AM 0.3 mL 03/31/2019 Intramuscular   Manufacturer: [employer], [employer]   Lot: [id number]   NDC: [address]-5

## 2019-07-20 ENCOUNTER — Telehealth: Payer: Self-pay | Admitting: Internal Medicine

## 2019-07-20 NOTE — Telephone Encounter (Signed)
   Patient calling to report she had second dose of  Covid vaccine/ Pfizer on last Saturday. Patient states on 3/27 she started to feel weak. She reports fatigue and dizziness.  Seeking advice.

## 2019-07-21 NOTE — Telephone Encounter (Signed)
It is not uncommon to feel weak.  Rest more.  Fatigue should get better in a couple days.  Thanks

## 2019-07-24 NOTE — Telephone Encounter (Signed)
Spoke with patient today and info given. 

## 2019-09-04 ENCOUNTER — Other Ambulatory Visit: Payer: Self-pay | Admitting: Internal Medicine

## 2019-09-04 DIAGNOSIS — Z1231 Encounter for screening mammogram for malignant neoplasm of breast: Secondary | ICD-10-CM

## 2019-09-25 ENCOUNTER — Encounter: Payer: Self-pay | Admitting: Internal Medicine

## 2019-09-25 ENCOUNTER — Other Ambulatory Visit: Payer: Self-pay

## 2019-09-25 ENCOUNTER — Ambulatory Visit (INDEPENDENT_AMBULATORY_CARE_PROVIDER_SITE_OTHER): Payer: Medicare Other | Admitting: Internal Medicine

## 2019-09-25 DIAGNOSIS — K625 Hemorrhage of anus and rectum: Secondary | ICD-10-CM | POA: Insufficient documentation

## 2019-09-25 DIAGNOSIS — F419 Anxiety disorder, unspecified: Secondary | ICD-10-CM

## 2019-09-25 DIAGNOSIS — R7989 Other specified abnormal findings of blood chemistry: Secondary | ICD-10-CM | POA: Diagnosis not present

## 2019-09-25 LAB — URINALYSIS, ROUTINE W REFLEX MICROSCOPIC
Bilirubin Urine: NEGATIVE
Ketones, ur: NEGATIVE
Nitrite: NEGATIVE
Specific Gravity, Urine: 1.015 (ref 1.000–1.030)
Total Protein, Urine: NEGATIVE
Urine Glucose: NEGATIVE
Urobilinogen, UA: 0.2 (ref 0.0–1.0)
pH: 7 (ref 5.0–8.0)

## 2019-09-25 LAB — CBC WITH DIFFERENTIAL/PLATELET
Basophils Absolute: 0.1 10*3/uL (ref 0.0–0.1)
Basophils Relative: 1.1 % (ref 0.0–3.0)
Eosinophils Absolute: 0 10*3/uL (ref 0.0–0.7)
Eosinophils Relative: 0.9 % (ref 0.0–5.0)
HCT: 35.7 % — ABNORMAL LOW (ref 36.0–46.0)
Hemoglobin: 12.3 g/dL (ref 12.0–15.0)
Lymphocytes Relative: 37.4 % (ref 12.0–46.0)
Lymphs Abs: 1.7 10*3/uL (ref 0.7–4.0)
MCHC: 34.4 g/dL (ref 30.0–36.0)
MCV: 98.2 fl (ref 78.0–100.0)
Monocytes Absolute: 0.4 10*3/uL (ref 0.1–1.0)
Monocytes Relative: 9.7 % (ref 3.0–12.0)
Neutro Abs: 2.3 10*3/uL (ref 1.4–7.7)
Neutrophils Relative %: 50.9 % (ref 43.0–77.0)
Platelets: 156 10*3/uL (ref 150.0–400.0)
RBC: 3.63 Mil/uL — ABNORMAL LOW (ref 3.87–5.11)
RDW: 14.6 % (ref 11.5–15.5)
WBC: 4.5 10*3/uL (ref 4.0–10.5)

## 2019-09-25 LAB — BASIC METABOLIC PANEL
BUN: 14 mg/dL (ref 6–23)
CO2: 30 mEq/L (ref 19–32)
Calcium: 9.2 mg/dL (ref 8.4–10.5)
Chloride: 98 mEq/L (ref 96–112)
Creatinine, Ser: 0.82 mg/dL (ref 0.40–1.20)
GFR: 69.97 mL/min (ref >60.00)
Glucose, Bld: 96 mg/dL (ref 70–99)
Potassium: 3.9 mEq/L (ref 3.5–5.1)
Sodium: 131 mEq/L — ABNORMAL LOW (ref 135–145)

## 2019-09-25 NOTE — Assessment & Plan Note (Addendum)
Likely due to mechanical irritation/possible hemorrhoids/fissure.  Obtain labs.  I suggested to cut back on her walks.  Use wet wipes and water for cleaning.  Ref to Dr Marina Goodell

## 2019-09-25 NOTE — Assessment & Plan Note (Signed)
Walking >5 miles Labs, hydration

## 2019-09-25 NOTE — Progress Notes (Signed)
Subjective:  Patient ID: Alayah Knouff, female    DOB: 06-Apr-1956  Age: 64 y.o. MRN: [id number]  CC: No chief complaint on file.   HPI Terina Mcelhinny presents for an episode of stool incontinence yesterday morning.  She tends to walk many miles per day and has to wear an adult diaper because of the occasional problems.  She walks in the neighborhood and there is no public restrooms available.  She cleaned aggressively - toilet paper, wipes,shower. Last night she was peeing - saw blood in a toilet.  She is anxious. Due colonoscopy   Outpatient Medications Prior to Visit  Medication Sig Dispense Refill  . Calcium-Vitamin D-Vitamin K (VIACTIV PO) Take 1 tablet by mouth 2 (two) times daily. CHEW    . cyclobenzaprine (FLEXERIL) 5 MG tablet Take 1 tablet (5 mg total) by mouth 3 (three) times daily as needed for muscle spasms. 90 tablet 1  . diazepam (VALIUM) 2 MG tablet Take 1 tablet (2 mg total) by mouth every 8 (eight) hours as needed for anxiety. 60 tablet 1  . diclofenac sodium (VOLTAREN) 1 % GEL Apply 4 g topically 4 (four) times daily. (Patient taking differently: Apply 4 g topically See admin instructions. Apply 4 grams to the right knee four times daily) 100 g 3  . diphenhydrAMINE (BENADRYL) 50 MG tablet Take 1 tablet (50 mg total) by mouth every 6 (six) hours as needed for itching or allergies. 30 tablet 1  . docusate sodium (COLACE) 100 MG capsule Take 1 capsule (100 mg total) by mouth 2 (two) times daily. (Patient taking differently: Take 100 mg by mouth See admin instructions. Take 100 mg by mouth one to two times daily) 10 capsule 0  . EPINEPHrine (EPI-PEN) 0.3 mg/0.3 mL DEVI Inject 0.3 mg into the muscle once as needed (anaphylaxis).     Marland Kitchen EPINEPHrine (EPIPEN 2-PAK) 0.3 mg/0.3 mL IJ SOAJ injection Inject 0.3 mLs (0.3 mg total) into the muscle as needed for anaphylaxis. 1 each 3  . estrogen-methylTESTOSTERone 0.625-1.25 MG per tablet Take 1 tablet by mouth daily. 90 tablet 3  . fexofenadine  (ALLEGRA) 180 MG tablet Take 1 tablet (180 mg total) by mouth daily. 100 tablet 3  . hydrocortisone 2.5 % cream Apply 1 application topically 2 (two) times daily as needed (for rashes).     . meclizine (ANTIVERT) 25 MG tablet Take 1 tablet (25 mg total) by mouth 3 (three) times daily as needed for dizziness. 30 tablet 0  . meloxicam (MOBIC) 7.5 MG tablet Take 1 tablet (7.5 mg total) by mouth daily as needed for pain. 90 tablet 1  . Multiple Vitamin (MULTIVITAMIN WITH MINERALS) TABS tablet Take 1 tablet by mouth daily.    Marland Kitchen olopatadine (PATANOL) 0.1 % ophthalmic solution Place 1 drop into both eyes daily as needed for allergies.     Marland Kitchen ondansetron (ZOFRAN ODT) 4 MG disintegrating tablet Take 1 tablet (4 mg total) by mouth every 8 (eight) hours as needed for nausea or vomiting. 20 tablet 0  . polyethylene glycol (MIRALAX / GLYCOLAX) packet Take 17 g by mouth daily as needed for moderate constipation. (Patient taking differently: Take 17 g by mouth daily as needed for moderate constipation (MIX AND DRINK). ) 14 each 0  . pseudoephedrine (SUDAFED) 60 MG tablet Take 1 tablet (60 mg total) by mouth every 4 (four) hours as needed (allergic reaction). 60 tablet 1  . vitamin C (ASCORBIC ACID) 500 MG tablet Take 500 mg by mouth daily.      Marland Kitchen  ciprofloxacin (CIPRO) 250 MG tablet Take 1 tablet (250 mg total) by mouth 2 (two) times daily. (Patient not taking: Reported on 09/25/2019) 10 tablet 0   No facility-administered medications prior to visit.    ROS: Review of Systems  Constitutional: Negative for activity change, appetite change, chills, fatigue and unexpected weight change.  HENT: Negative for congestion, mouth sores and sinus pressure.   Eyes: Negative for visual disturbance.  Respiratory: Negative for cough and chest tightness.   Gastrointestinal: Positive for anal bleeding. Negative for abdominal pain, nausea and rectal pain.  Genitourinary: Negative for difficulty urinating, frequency and vaginal  pain.  Musculoskeletal: Negative for back pain and gait problem.  Skin: Negative for pallor and rash.  Neurological: Negative for dizziness, tremors, weakness, numbness and headaches.  Psychiatric/Behavioral: Negative for confusion and sleep disturbance. The patient is nervous/anxious.     Objective:  BP 136/74 (BP Location: Left Arm, Patient Position: Sitting, Cuff Size: Normal)   Pulse (!) 54   Temp 98.2 F (36.8 C) (Oral)   Ht 5\' 1"  (1.549 m)   Wt 121 lb (54.9 kg)   SpO2 98%   BMI 22.86 kg/m   BP Readings from Last 3 Encounters:  09/25/19 136/74  06/06/19 112/66  06/06/19 112/66    Wt Readings from Last 3 Encounters:  09/25/19 121 lb (54.9 kg)  06/06/19 121 lb (54.9 kg)  06/06/19 121 lb (54.9 kg)    Physical Exam Constitutional:      General: She is not in acute distress.    Appearance: She is well-developed.  HENT:     Head: Normocephalic.     Right Ear: External ear normal.     Left Ear: External ear normal.     Nose: Nose normal.  Eyes:     General:        Right eye: No discharge.        Left eye: No discharge.     Conjunctiva/sclera: Conjunctivae normal.     Pupils: Pupils are equal, round, and reactive to light.  Neck:     Thyroid: No thyromegaly.     Vascular: No JVD.     Trachea: No tracheal deviation.  Cardiovascular:     Rate and Rhythm: Normal rate and regular rhythm.     Heart sounds: Normal heart sounds.  Pulmonary:     Effort: No respiratory distress.     Breath sounds: No stridor. No wheezing.  Abdominal:     General: Bowel sounds are normal. There is no distension.     Palpations: Abdomen is soft. There is no mass.     Tenderness: There is no abdominal tenderness. There is no guarding or rebound.  Musculoskeletal:        General: No tenderness.     Cervical back: Normal range of motion and neck supple.  Lymphadenopathy:     Cervical: No cervical adenopathy.  Skin:    Findings: No erythema or rash.  Neurological:     Mental Status:  She is oriented to person, place, and time.     Cranial Nerves: No cranial nerve deficit.     Motor: No abnormal muscle tone.     Coordination: Coordination normal.     Deep Tendon Reflexes: Reflexes normal.  Psychiatric:        Behavior: Behavior normal.        Thought Content: Thought content normal.        Judgment: Judgment normal.   The patient declined rectal/pelvic exam  Lab Results  Component Value Date   WBC 4.1 06/06/2019   HGB 12.7 06/06/2019   HCT 38.3 06/06/2019   PLT 152.0 06/06/2019   GLUCOSE 99 06/06/2019   CHOL 202 (H) 12/28/2018   TRIG 31.0 12/28/2018   HDL 93.20 12/28/2018   LDLDIRECT 134.0 01/25/2012   LDLCALC 103 (H) 12/28/2018   ALT 15 06/06/2019   AST 22 06/06/2019   NA 136 06/06/2019   K 4.3 06/06/2019   CL 102 06/06/2019   CREATININE 1.01 06/06/2019   BUN 22 06/06/2019   CO2 29 06/06/2019   TSH 2.37 06/06/2019   INR 1.0 03/28/2019    CT HEAD WO CONTRAST  Result Date: 03/28/2019 CLINICAL DATA:  64 year old female with dizziness, lightheadedness and headache, unable to walk in a straight line since this morning. EXAM: CT HEAD WITHOUT CONTRAST TECHNIQUE: Contiguous axial images were obtained from the base of the skull through the vertex without intravenous contrast. COMPARISON:  None. FINDINGS: Brain: No evidence of acute infarction, hemorrhage, hydrocephalus, extra-axial collection or mass lesion/mass effect. Vascular: No hyperdense vessel or unexpected calcification. Skull: Normal. Negative for fracture or focal lesion. Sinuses/Orbits: No acute finding. Other: None. IMPRESSION: 1. No acute intracranial abnormalities. The appearance of the brain is normal. Electronically Signed   By: Vinnie Langton M.D.   On: 03/28/2019 19:48   MR BRAIN WO CONTRAST  Result Date: 03/29/2019 CLINICAL DATA:  Sudden onset dizziness with lightheadedness. EXAM: MRI HEAD WITHOUT CONTRAST TECHNIQUE: Multiplanar, multiecho pulse sequences of the brain and surrounding  structures were obtained without intravenous contrast. COMPARISON:  Head CT from yesterday FINDINGS: Brain: No acute infarction, hemorrhage, hydrocephalus, extra-axial collection or mass lesion. Single, small and chronic left inferior frontal white matter insult, nonspecific and commonly seen by patient age. Normal brain volume. Vascular: Normal flow voids, including vertebrobasilar Skull and upper cervical spine: Negative for marrow lesion. Right-sided facet spurring in the visible upper cervical spine. Sinuses/Orbits: Negative.  No mastoid or middle ear opacification IMPRESSION: Negative for infarct or other cause of symptoms. Electronically Signed   By: Monte Fantasia M.D.   On: 03/29/2019 06:04    Assessment & Plan:   There are no diagnoses linked to this encounter.   No orders of the defined types were placed in this encounter.    Follow-up: No follow-ups on file.  Walker Kehr, MD

## 2019-09-26 ENCOUNTER — Other Ambulatory Visit: Payer: Medicare Other

## 2019-09-26 ENCOUNTER — Encounter: Payer: Self-pay | Admitting: Internal Medicine

## 2019-09-26 ENCOUNTER — Telehealth: Payer: Self-pay | Admitting: Internal Medicine

## 2019-09-26 DIAGNOSIS — N3090 Cystitis, unspecified without hematuria: Secondary | ICD-10-CM

## 2019-09-26 NOTE — Telephone Encounter (Signed)
    Patient seen on 6/7 Patient calling to report bladder pressure, frequent urination  Please advise

## 2019-09-26 NOTE — Telephone Encounter (Signed)
UA and cx Thx

## 2019-09-26 NOTE — Telephone Encounter (Signed)
UA was done yesterday. Faxed down order for culture.Marland KitchenRaechel Chute

## 2019-09-27 LAB — URINE CULTURE
MICRO NUMBER:: 10566008
SPECIMEN QUALITY:: ADEQUATE

## 2019-09-27 NOTE — Telephone Encounter (Signed)
UA was ok Await cx Thx

## 2019-09-28 NOTE — Telephone Encounter (Signed)
Pt informed of below.  

## 2019-09-29 ENCOUNTER — Encounter: Payer: Self-pay | Admitting: Internal Medicine

## 2019-09-29 DIAGNOSIS — F419 Anxiety disorder, unspecified: Secondary | ICD-10-CM | POA: Insufficient documentation

## 2019-09-29 NOTE — Assessment & Plan Note (Signed)
OCD traits discussed

## 2019-10-09 ENCOUNTER — Other Ambulatory Visit: Payer: Self-pay

## 2019-10-09 ENCOUNTER — Ambulatory Visit
Admission: RE | Admit: 2019-10-09 | Discharge: 2019-10-09 | Disposition: A | Discharge status: Home / Self Care | Payer: Medicare Other | Source: Ambulatory Visit | Attending: Internal Medicine | Admitting: Internal Medicine

## 2019-10-09 DIAGNOSIS — Z1231 Encounter for screening mammogram for malignant neoplasm of breast: Secondary | ICD-10-CM

## 2019-11-22 ENCOUNTER — Ambulatory Visit (INDEPENDENT_AMBULATORY_CARE_PROVIDER_SITE_OTHER): Payer: Medicare Other | Admitting: Internal Medicine

## 2019-11-22 ENCOUNTER — Encounter: Payer: Self-pay | Admitting: Internal Medicine

## 2019-11-22 VITALS — BP 100/60 | HR 68 | Ht 64.0 in | Wt 121.5 lb

## 2019-11-22 DIAGNOSIS — R1031 Right lower quadrant pain: Secondary | ICD-10-CM | POA: Diagnosis not present

## 2019-11-22 DIAGNOSIS — K5901 Slow transit constipation: Secondary | ICD-10-CM

## 2019-11-22 DIAGNOSIS — Z1211 Encounter for screening for malignant neoplasm of colon: Secondary | ICD-10-CM

## 2019-11-22 MED ORDER — PLENVU 140 G PO SOLR
140.0000 g | ORAL | 0 refills | Status: DC
Start: 2019-11-22 — End: 2020-01-03

## 2019-11-22 NOTE — Patient Instructions (Signed)
If you are age 65 or older, your body mass index should be between 23-30. Your Body mass index is 20.86 kg/m. If this is out of the aforementioned range listed, please consider follow up with your Primary Care Provider.  If you are age 10 or younger, your body mass index should be between 19-25. Your Body mass index is 20.86 kg/m. If this is out of the aformentioned range listed, please consider follow up with your Primary Care Provider.   You have been scheduled for a colonoscopy. Please follow written instructions given to you at your visit today.  Please pick up your prep supplies at the pharmacy within the next 1-3 days. If you use inhalers (even only as needed), please bring them with you on the day of your procedure.   It was a pleasure to see you today!  Dr. Marina Goodell

## 2019-11-24 ENCOUNTER — Encounter: Payer: Self-pay | Admitting: Internal Medicine

## 2019-11-24 NOTE — Progress Notes (Signed)
HISTORY OF PRESENT ILLNESS:  Karen [name] is a 64 y.o. 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  Some issues such as constipation and chronic.  Other issues concerned her after having had emergency appendectomy for ruptured appendicitis over 2 years ago.  I have reviewed hospital records, imaging, and laboratories.  She saw her primary care physician Dr. Alain Marion September 25, 2019 for an episode of fecal incontinence.  She tells me that this was a one-time entity without recurrence.  She describes constipation her whole life.  She thinks this is improved with improved diet.  She describes herself as vigilant with regards to her health.  She tells me that she avoids dairy and red meat.  Exercises regularly.  For chronic constipation she does take Colace once daily.  MiraLAX works nicely.  She wonders if it is okay to use MiraLAX regularly.  Her laparoscopic appendectomy occurred in May 2019 with Dr. Redmond Pulling.  She did have postop ileus.  She was treated with antibiotics discharged home within 1 week.  Since that time she does describe some intermittent problems with some discomfort on the right side the abdomen.  There is a pinching sensation near the scar.  Some nausea but no vomiting.  She will occasionally have some bleeding secondary to hemorrhoids.  She did undergo a colonoscopy in [place] at age 25.  She tells me this was normal.  She also underwent flexible sigmoidoscopy, which has been reviewed, it is 54 for change in bowel habits.  This was unremarkable.  She has completed her Covid vaccination series.  The patient does have multiple questions regarding the colonoscopy process, including preparation, sedation, safety and monitoring.  REVIEW OF SYSTEMS:  All non-GI ROS negative unless otherwise stated in HPI.  Past Medical History:  Diagnosis Date    Arthritis    right   Climacteric    on HRT   Complication of anesthesia    Epiglottitis 1959   tracheotomy   Finger pain    pain with pressure on the PIP joint with hemorrhage and swelling. MRI hand inconclusive    History of varicella    Lumbar back pain 1999    initial strain. Intermittent pain since. Re-injured Dec '11. Had PT IN THE PAST.   Plantar fasciitis    Pneumonia 1993   hospitalized   PONV (postoperative nausea and vomiting)     Past Surgical History:  Procedure Laterality Date   ABDOMINAL HYSTERECTOMY     endometriosis   APPENDECTOMY  08/26/2017   ARTHROSCOPIC REPAIR ACL  1995   right-reconstructed with patellar tendon   FOOT SURGERY  1999   left foot-arch ligament release (metatarsal release ?)   LAPAROSCOPIC APPENDECTOMY N/A 08/26/2017   Procedure: APPENDECTOMY LAPAROSCOPIC;  Surgeon: Greer Pickerel, MD;  Location: [facility];  Service: General;  Laterality: N/A;   [place]   right medial/ open   REDUCTION MAMMAPLASTY Bilateral 1976   reduction mammoplasty  [place]  '02    Social History Dorisann Schwanke  reports that she has never smoked. She has never used smokeless tobacco. She reports that she does not drink alcohol and does not use drugs.  family history includes Alcohol abuse in her father; Gout in her father; HIV in her brother; Heart disease in her father; Hyperlipidemia in her father; Hypertension in her father and  mother; Obesity in her father.  Allergies  Allergen Reactions   Penicillins Anaphylaxis    Did it involve swelling of the face/tongue/throat, SOB, or low BP? Yes Did it involve sudden or severe rash/hives, skin peeling, or any reaction on the inside of your mouth or nose? Yes Did you need to seek medical attention at a hospital or doctor's office? Yes When did it last happen?"more than 10 years ago" If all above answers are "NO", may proceed with  cephalosporin use.    Adhesive [Tape] Hives   Hydrocodone Nausea Only   Meclizine     Headache   Singulair [Montelukast Sodium] Other (See Comments)    "Numbness"       PHYSICAL EXAMINATION: Vital signs: BP 100/60    Pulse 68    Ht _0  (1.626 m) Comment: height without shoes   Wt 121 lb 8 oz (55.1 kg)    BMI 20.86 kg/m   Constitutional: generally well-appearing, no acute distress Psychiatric: alert and oriented x3, cooperative Eyes: extraocular movements intact, anicteric, conjunctiva pink Mouth: Mask Neck: supple no lymphadenopathy Cardiovascular: heart regular rate and rhythm, no murmur Lungs: clear to auscultation bilaterally Abdomen: soft, nontender, nondistended, no obvious ascites, no peritoneal signs, normal bowel sounds, no organomegaly.  Prior surgical incision well-healed Rectal: Deferred to colonoscopy Extremities: no clubbing, cyanosis, or lower extremity edema bilaterally Skin: no lesions on visible extremities Neuro: No focal deficits.  Cranial nerves intact  ASSESSMENT:  1.  Chronic constipation.  Functional 2.  Intermittent right-sided discomfort possibly related to adhesions.  No acute process 3.  Rectal bleeding attributed to hemorrhoids.  Needs evaluated 4.  Colon cancer screening.  Due.  Index examination age 8 and flexible sigmoidoscopy age 28.   PLAN:  1.  Continue Colace 2.  Advised that she may titrate MiraLAX to achieve desired effect 3.  Schedule colonoscopy to provide colon cancer screening and evaluate for bleeding and right-sided pain.The nature of the procedure, as well as the risks, benefits, and alternatives were carefully and thoroughly reviewed with the patient. Ample time for discussion and questions allowed. The patient understood, was satisfied, and agreed to proceed. 4.  Ongoing general medical care with Dr. Alain Marion A total time of 45 minutes was spent preparing to see the patient, reviewing tests, x-rays, operative reports,  x-rays, and outside endoscopy reports.  Also, obtaining history and performing comprehensive physical exam.  Additionally, counseling the patient regarding her above listed issues discussing MiraLAX titration, and ordering advanced endoscopic procedure.  Finally, documenting clinical information in the health record

## 2019-12-19 ENCOUNTER — Other Ambulatory Visit: Payer: Self-pay

## 2019-12-19 ENCOUNTER — Encounter: Payer: Self-pay | Admitting: Family Medicine

## 2019-12-19 ENCOUNTER — Ambulatory Visit: Payer: Self-pay

## 2019-12-19 ENCOUNTER — Ambulatory Visit (INDEPENDENT_AMBULATORY_CARE_PROVIDER_SITE_OTHER): Payer: Medicare Other | Admitting: Family Medicine

## 2019-12-19 VITALS — BP 102/68 | HR 48 | Ht 64.0 in | Wt 126.6 lb

## 2019-12-19 DIAGNOSIS — G8929 Other chronic pain: Secondary | ICD-10-CM

## 2019-12-19 DIAGNOSIS — R1031 Right lower quadrant pain: Secondary | ICD-10-CM

## 2019-12-19 NOTE — Progress Notes (Signed)
I, [name], LAT, ATC, am serving as scribe for Dr. [name].  Karen [name] is a 64 y.o. female who presents to [facility] today for abdominal pain that occurred after playing tennis.  She locates her pain to .  She was last seen by Dr. Denyse Amass on 07/04/19 for her L elbow.  Since then, pt states that she had a prior issue w/ her R lower quadrant prior to her L elbow issue that only occurred w/ serving.  She states that she has resumed tennis as of July and is feeling better in terms of her L elbow but her R lower quadrant pain has returned.  She has a hx of a R appendectomy in 2019.  She notes that Dr. Posey Rea feels that she may have a hernia located just above her umbilicus.  Her R lower quadrant pain only occurs when she serves during tennis.  She is a Film/video editor.  Patient has a personal relationship with a physical therapist that she sees regularly.  Pertinent review of systems: No fevers or chills  Relevant historical information: History of Perforated appendicitis and surgery May 2019   Exam:  BP 102/68 (BP Location: Right Arm, Patient Position: Sitting, Cuff Size: Normal)   Pulse (!) 48   Ht 5\' 4"  (1.626 m)   Wt 126 lb 9.6 oz (57.4 kg)   SpO2 98%   BMI 21.73 kg/m  General: Well Developed, well nourished, and in no acute distress.   MSK: Abdomen normal-appearing Normal motion. Mildly tender palpation right lower quadrant.  Pain is the same with abdominal muscles contracted and relax. No rebound or guarding.  No masses palpated right lower quadrant.  Small nontender mass midline nonreducible.  Approximately 2 to 3 cm caudal to umbilicus.  Normal hip strength to flexion.    Lab and Radiology Results Diagnostic Limited MSK Ultrasound of: Abdomen Normal appearance musculature right lower quadrant.  No fascial herniations or significant muscular herniation present in this area.  Intestinal structures seem to be moving freely with  inspiration deep to musculature. Palpable midline nodule visualized appears to be small hernia not involving abdominal contents. Impression: Right lower quadrant muscle pain     Assessment and Plan: 64 y.o. female with right lower quadrant muscle pain with tennis serve.  Patient had perforated appendicitis and surgery in 2019.  She likely does have some either injury to the surrounding structure or adhesions or potential nerve irritation as result of this.  She is having pain with her left-handed tennis serve which would require these muscles to stretch in the contract mildly.  Fundamentally this may act like a sports hernia.  She should do well with dedicated core strengthening with her physical therapist.  If not improving would consider MRI with sports hernia protocol to further evaluate this pain.  PDMP not reviewed this encounter. Orders Placed This Encounter  Procedures  . 2020 LIMITED JOINT SPACE STRUCTURES LOW RIGHT(NO LINKED CHARGES)    Order Specific Question:   Reason for Exam (SYMPTOM  OR DIAGNOSIS REQUIRED)    Answer:   right lower quadrant pain    Order Specific Question:   Preferred imaging location?    Answer:   [facility]   No orders of the defined types were placed in this encounter.    Discussed warning signs or symptoms. Please see discharge instructions. Patient expresses understanding.   The above documentation has been reviewed and is accurate and complete US,  M.D.

## 2019-12-19 NOTE — Patient Instructions (Signed)
Thank you for coming in today. I do not see much wrong with the structures in your belly that I can see with an ultrasound.  I think this will improve with core strength with your PT.  If not better we can do more imaging with either CT or MRI (MRI preferred).  I am ok with you playing tennis as limited by your pain.   I think you may have a sports hernia.    You do also have a tiny midline hernia.    Sports Hernia A sports hernia is a type of soft tissue injury that causes groin pain. Any strain or tear in the soft tissues of the groin or lower abdomen can be called a sports hernia. It is a common overuse injury in athletes who play sports that involve a lot of repetitive kicking and twisting motions or sudden changes in direction. A sports hernia is not the same kind of injury as other types of hernias. With a sports hernia, there is usually no opening in the muscles of the groin or abdomen for tissue to bulge through. What are the causes? This condition is caused by muscle strain or torn tendons, usually from overuse. It may also be caused by weakness or imbalance in your abdominal muscles. What increases the risk? You may be more likely to develop this condition if you are an athlete who plays:  Ice hockey.  Football.  Wrestling.  Soccer.  Rugby. Sports hernias are more common in males. What are the signs or symptoms? The main symptom of this condition is severe groin pain on one side of the abdomen. This pain is often sharp with activity and better with rest. There is no visible bulge in the abdomen. Pain typically increases with:  Running, especially sprinting.  Running and changing directions quickly.  Abdominal exercises.  Twisting or kicking motions.  Coughing or sneezing.  The application of pressure over the injured area. How is this diagnosed? This condition is diagnosed based on:  A physical exam. During the exam, your health care provider may feel for  areas of tenderness or ask you to do certain movements to test for pain.  Your medical history.  Imaging studies. These may include X-rays or an MRI. How is this treated? Treatment for this condition includes:  Stopping all activities that cause pain or make your condition worse.  Using ice to relieve pain and inflammation.  Taking NSAIDs or getting corticosteroid injections to lessen pain and swelling.  Doing range-of-motion and strengthening exercises (physical therapy) as told by your health care provider or physical therapist. You may need surgery to help repair and stabilize the injured area if other treatments do not work. Follow these instructions at home: Managing pain and swelling   If directed, put ice on the injured area. ? Put ice in a plastic bag. ? Place a towel between your skin and the bag. ? Leave the ice on for 20 minutes, 2-3 times a day. General instructions  Take over-the-counter and prescription medicines only as told by your health care provider.  Do exercises as told by your health care provider.  Return to your normal activities as told by your health care provider. Ask your health care provider what activities are safe for you.  Keep all follow-up visits as told by your health care provider. This is important. How is this prevented?  Warm up and stretch before being active.  Cool down and stretch after being active.  Apply ice right  after intense periods of activity to lessen inflammation.  Give your body time to rest between periods of activity.  Maintain your strength and flexibility through regular physical fitness. Contact a health care provider if:  Your pain and tenderness continue or get worse after treatment. Summary  A sports hernia is a type of soft tissue injury that causes groin pain. There is no visible bulge with this injury.  It is a common overuse injury in athletes who play sports that involve a lot of repetitive kicking  and twisting motions or sudden changes in direction.  Groin pain, that worsens with activity and improves with rest, is the main symptom of this condition.  Treatment includes rest, ice, medicine, physical therapy, and surgery if needed. This information is not intended to replace advice given to you by your health care provider. Make sure you discuss any questions you have with your health care provider. Document Revised: 07/28/2018 Document Reviewed: 04/01/2018 Elsevier Patient Education  2020 [employer].

## 2019-12-26 ENCOUNTER — Ambulatory Visit: Payer: No Typology Code available for payment source | Admitting: Internal Medicine

## 2020-01-02 ENCOUNTER — Ambulatory Visit: Payer: No Typology Code available for payment source | Admitting: Internal Medicine

## 2020-01-03 ENCOUNTER — Ambulatory Visit (AMBULATORY_SURGERY_CENTER): Payer: Medicare Other | Admitting: Internal Medicine

## 2020-01-03 ENCOUNTER — Other Ambulatory Visit: Payer: Self-pay

## 2020-01-03 ENCOUNTER — Encounter: Payer: Self-pay | Admitting: Internal Medicine

## 2020-01-03 VITALS — BP 107/60 | HR 45 | Temp 96.8°F | Resp 12 | Ht 64.0 in | Wt 121.0 lb

## 2020-01-03 DIAGNOSIS — Z1211 Encounter for screening for malignant neoplasm of colon: Secondary | ICD-10-CM

## 2020-01-03 MED ORDER — FLEET ENEMA 7-19 GM/118ML RE ENEM
1.0000 | ENEMA | Freq: Once | RECTAL | Status: AC
  Administered 2020-01-03: 1 via RECTAL

## 2020-01-03 MED ORDER — SODIUM CHLORIDE 0.9 % IV SOLN: 500.0000 mL | Freq: Once | INTRAVENOUS | Status: DC

## 2020-01-03 NOTE — Op Note (Signed)
[facility] Patient Name: Karen [name] Procedure Date: 01/03/2020 9:15 AM MRN: [id number] Endoscopist: Wilhemina Bonito. Marina Goodell , MD Age: 64 Referring MD:  Date of Birth: 1956-01-02 Gender: Female Account #: [id number] Procedure:                Colonoscopy Indications:              Screening for colorectal malignant neoplasm.                            Previous examination age 7 in [place]                            negative for neoplasia. Flexible sigmoidoscopy age                            64, unremarkable. Medicines:                Monitored Anesthesia Care Procedure:                Pre-Anesthesia Assessment:                           - Prior to the procedure, a History and Physical                            was performed, and patient medications and                            allergies were reviewed. The patient's tolerance of                            previous anesthesia was also reviewed. The risks                            and benefits of the procedure and the sedation                            options and risks were discussed with the patient.                            All questions were answered, and informed consent                            was obtained. Prior Anticoagulants: The patient has                            taken no previous anticoagulant or antiplatelet                            agents. ASA Grade Assessment: I - A normal, healthy                            patient. After reviewing the risks and benefits,  the patient was deemed in satisfactory condition to                            undergo the procedure.                           After obtaining informed consent, the colonoscope                            was passed under direct vision. Throughout the                            procedure, the patient's blood pressure, pulse, and                            oxygen saturations were monitored continuously. The                             Colonoscope was introduced through the anus with                            the intention of advancing to the cecum. The scope                            was advanced to the transverse colon before the                            procedure was aborted due to poor preparation.                            Medications were given. The rectum was                            photographed. The quality of the bowel preparation                            was poor. The colonoscopy was performed without                            difficulty. The patient tolerated the procedure                            well. The bowel preparation used was Plenvu via                            split dose instruction. Patient had nausea and                            vomiting with the second session. Reported muddy                            stools for which a cleansing enema was provided Scope In: Scope Out: Findings:  The pediatric colonoscope was advanced to the level                            of the transverse colon. The examination could not                            be carried out beyond this region due to poor                            preparation. The examined portions of the left                            colon were unremarkable. Retroflexed view of the                            rectum was unremarkable. Complications:            No immediate complications. Estimated blood loss:                            None. Estimated Blood Loss:     Estimated blood loss: none. Impression:               1. Incomplete colonoscopy due to poor preparation                            as described                           2. No abnormalities found in the examined portions                            of the colon. Recommendation:           - Repeat colonoscopy in 1 year for screening                            purposes. Would recommend more extensive                            preparation with  magnesium citrate the morning                            before the procedure followed by standard split                            prep, using Suprep. Would also provide antiemetics                            such as Zofran to be given before each session and                            thereafter if needed.                           - Patient has a contact  number available for                            emergencies. The signs and symptoms of potential                            delayed complications were discussed with the                            patient. Return to normal activities tomorrow.                            Written discharge instructions were provided to the                            patient.                           - Resume previous diet.                           - Continue present medications. Wilhemina Bonito. Marina Goodell, MD 01/03/2020 9:37:19 AM This report has been signed electronically.

## 2020-01-03 NOTE — Progress Notes (Signed)
Report to PACU, RN, vss, BBS= Clear.  

## 2020-01-03 NOTE — Progress Notes (Addendum)
3570 Results of enema, large amount of dark brown liquid stool with some pieces of stool material.  Advised that Dr. Marina Goodell could flush the colon during the procedure, however he may not get a complete look and she might need to repeat colonoscopy.  Pt agreed and Dr. Marina Goodell notified.Marland Kitchen

## 2020-01-03 NOTE — Progress Notes (Signed)
Pt's states no medical or surgical changes since previsit or office visit.  VS KW  Pt states she projectile vomited this am after prep.  Had solid stool at 0400 and dark brown liquid prior to admission.  Enema instilled at 0845 per Dr. Lamar Sprinkles order.

## 2020-01-03 NOTE — Patient Instructions (Signed)
YOU HAD AN ENDOSCOPIC PROCEDURE TODAY AT THE [facility]:   Refer to the procedure report that was given to you for any specific questions about what was found during the examination.  If the procedure report does not answer your questions, please call your gastroenterologist to clarify.  If you requested that your care partner not be given the details of your procedure findings, then the procedure report has been included in a sealed envelope for you to review at your convenience later.  YOU SHOULD EXPECT: Some feelings of bloating in the abdomen. Passage of more gas than usual.  Walking can help get rid of the air that was put into your GI tract during the procedure and reduce the bloating. If you had a lower endoscopy (such as a colonoscopy or flexible sigmoidoscopy) you may notice spotting of blood in your stool or on the toilet paper. If you underwent a bowel prep for your procedure, you may not have a normal bowel movement for a few days.  Please Note:  You might notice some irritation and congestion in your nose or some drainage.  This is from the oxygen used during your procedure.  There is no need for concern and it should clear up in a day or so.  SYMPTOMS TO REPORT IMMEDIATELY:   Following lower endoscopy (colonoscopy or flexible sigmoidoscopy):  Excessive amounts of blood in the stool  Significant tenderness or worsening of abdominal pains  Swelling of the abdomen that is new, acute  Fever of 100F or higher    For urgent or emergent issues, a gastroenterologist can be reached at any hour by calling [phone]. Do not use MyChart messaging for urgent concerns.    DIET:  We do recommend a small meal at first, but then you may proceed to your regular diet.  Drink plenty of fluids but you should avoid alcoholic beverages for 24 hours.  ACTIVITY:  You should plan to take it easy for the rest of today and you should NOT DRIVE or use heavy machinery until tomorrow  (because of the sedation medicines used during the test).    FOLLOW UP: Our staff will call the number listed on your records 48-72 hours following your procedure to check on you and address any questions or concerns that you may have regarding the information given to you following your procedure. If we do not reach you, we will leave a message.  We will attempt to reach you two times.  During this call, we will ask if you have developed any symptoms of COVID 19. If you develop any symptoms (ie: fever, flu-like symptoms, shortness of breath, cough etc.) before then, please call [phone].  If you test positive for Covid 19 in the 2 weeks post procedure, please call and report this information to [place].    If any biopsies were taken you will be contacted by phone or by letter within the next 1-3 weeks.  Please call us at [phone] if you have not heard about the biopsies in 3 weeks.    SIGNATURES/CONFIDENTIALITY: You and/or your care partner have signed paperwork which will be entered into your electronic medical record.  These signatures attest to the fact that that the information above on your After Visit Summary has been reviewed and is understood.  Full responsibility of the confidentiality of this discharge information lies with you and/or your care-partner.  Resume medications. Repeat procedure in one year.

## 2020-01-05 ENCOUNTER — Telehealth: Payer: Self-pay | Admitting: *Deleted

## 2020-01-05 NOTE — Telephone Encounter (Signed)
  Follow up Call-  Call back number 01/03/2020  Post procedure Call Back phone  # [phone]  Permission to leave phone message Yes  Some recent data might be hidden     Patient questions:  Do you have a fever, pain , or abdominal swelling? No. Pain Score  0 *  Have you tolerated food without any problems? Yes.    Have you been able to return to your normal activities? Yes.    Do you have any questions about your discharge instructions: Diet   No. Medications  No. Follow up visit  No.  Do you have questions or concerns about your Care? Yes.    Actions: * If pain score is 4 or above: No action needed, pain <4.  1. Have you developed a fever since your procedure? no  2.   Have you had an respiratory symptoms (SOB or cough) since your procedure? no  3.   Have you tested positive for COVID 19 since your procedure no  4.   Have you had any family members/close contacts diagnosed with the COVID 19 since your procedure?  no   If yes to any of these questions please route to Laverna Peace, RN and Karlton Lemon, RN

## 2020-01-10 ENCOUNTER — Other Ambulatory Visit: Payer: Self-pay

## 2020-01-10 ENCOUNTER — Ambulatory Visit (INDEPENDENT_AMBULATORY_CARE_PROVIDER_SITE_OTHER): Payer: Medicare Other | Admitting: Internal Medicine

## 2020-01-10 ENCOUNTER — Encounter: Payer: Self-pay | Admitting: Internal Medicine

## 2020-01-10 VITALS — BP 118/82 | HR 68 | Temp 98.3°F | Ht 64.0 in | Wt 120.0 lb

## 2020-01-10 DIAGNOSIS — S6000XA Contusion of unspecified finger without damage to nail, initial encounter: Secondary | ICD-10-CM | POA: Insufficient documentation

## 2020-01-10 DIAGNOSIS — Z23 Encounter for immunization: Secondary | ICD-10-CM | POA: Diagnosis not present

## 2020-01-10 DIAGNOSIS — R110 Nausea: Secondary | ICD-10-CM | POA: Insufficient documentation

## 2020-01-10 DIAGNOSIS — S60032A Contusion of left middle finger without damage to nail, initial encounter: Secondary | ICD-10-CM

## 2020-01-10 MED ORDER — ONDANSETRON HCL 4 MG PO TABS: 4.0000 mg | ORAL_TABLET | Freq: Three times a day (TID) | ORAL | 0 refills | Status: AC | PRN

## 2020-01-10 NOTE — Assessment & Plan Note (Signed)
Recurrent - info given

## 2020-01-10 NOTE — Progress Notes (Signed)
Subjective:  Patient ID: Karen [name], female    DOB: 1955-10-22  Age: 64 y.o. MRN: [id number]  CC: No chief complaint on file.   HPI Karen [name] presents for GI problems, anxiety. C/o n/v w/colon prep - had a colonoscopy incomplete due to n/v C/o hydration problems - try Liquid I.V. Hydration Multiplier   Outpatient Medications Prior to Visit  Medication Sig Dispense Refill  . Calcium-Vitamin D-Vitamin K (VIACTIV PO) Take 1 tablet by mouth 2 (two) times daily. CHEW    . ciprofloxacin (CIPRO) 250 MG tablet Take 1 tablet (250 mg total) by mouth 2 (two) times daily. 10 tablet 0  . cyclobenzaprine (FLEXERIL) 5 MG tablet Take 1 tablet (5 mg total) by mouth 3 (three) times daily as needed for muscle spasms. 90 tablet 1  . diazepam (VALIUM) 2 MG tablet Take 1 tablet (2 mg total) by mouth every 8 (eight) hours as needed for anxiety. 60 tablet 1  . diclofenac sodium (VOLTAREN) 1 % GEL Apply 4 g topically 4 (four) times daily. (Patient taking differently: Apply 4 g topically See admin instructions. Apply 4 grams to the right knee four times daily) 100 g 3  . diphenhydrAMINE (BENADRYL) 50 MG tablet Take 1 tablet (50 mg total) by mouth every 6 (six) hours as needed for itching or allergies. 30 tablet 1  . docusate sodium (COLACE) 100 MG capsule Take 1 capsule (100 mg total) by mouth 2 (two) times daily. (Patient taking differently: Take 100 mg by mouth See admin instructions. Take 100 mg by mouth one to two times daily) 10 capsule 0  . EPINEPHrine (EPI-PEN) 0.3 mg/0.3 mL DEVI Inject 0.3 mg into the muscle once as needed (anaphylaxis).     Marland Kitchen EPINEPHrine (EPIPEN 2-PAK) 0.3 mg/0.3 mL IJ SOAJ injection Inject 0.3 mLs (0.3 mg total) into the muscle as needed for anaphylaxis. 1 each 3  . estrogen-methylTESTOSTERone 0.625-1.25 MG per tablet Take 1 tablet by mouth daily. 90 tablet 3  . fexofenadine (ALLEGRA) 180 MG tablet Take 1 tablet (180 mg total) by mouth daily. 100 tablet 3  . hydrocortisone 2.5 % cream  Apply 1 application topically 2 (two) times daily as needed (for rashes).     . meclizine (ANTIVERT) 25 MG tablet Take 1 tablet (25 mg total) by mouth 3 (three) times daily as needed for dizziness. 30 tablet 0  . meloxicam (MOBIC) 7.5 MG tablet Take 1 tablet (7.5 mg total) by mouth daily as needed for pain. 90 tablet 1  . Multiple Vitamin (MULTIVITAMIN WITH MINERALS) TABS tablet Take 1 tablet by mouth daily.    Marland Kitchen olopatadine (PATANOL) 0.1 % ophthalmic solution Place 1 drop into both eyes daily as needed for allergies.     Marland Kitchen ondansetron (ZOFRAN ODT) 4 MG disintegrating tablet Take 1 tablet (4 mg total) by mouth every 8 (eight) hours as needed for nausea or vomiting. 20 tablet 0  . polyethylene glycol (MIRALAX / GLYCOLAX) packet Take 17 g by mouth daily as needed for moderate constipation. (Patient taking differently: Take 17 g by mouth daily as needed for moderate constipation (MIX AND DRINK). ) 14 each 0  . pseudoephedrine (SUDAFED) 60 MG tablet Take 1 tablet (60 mg total) by mouth every 4 (four) hours as needed (allergic reaction). 60 tablet 1  . vitamin C (ASCORBIC ACID) 500 MG tablet Take 500 mg by mouth daily.       No facility-administered medications prior to visit.    ROS: Review of Systems  Constitutional: Negative  for activity change, appetite change, chills, fatigue and unexpected weight change.  HENT: Negative for congestion, mouth sores and sinus pressure.   Eyes: Negative for visual disturbance.  Respiratory: Negative for cough and chest tightness.   Gastrointestinal: Positive for constipation. Negative for abdominal pain and nausea.  Genitourinary: Negative for difficulty urinating, frequency and vaginal pain.  Musculoskeletal: Negative for back pain and gait problem.  Skin: Negative for pallor and rash.  Neurological: Negative for dizziness, tremors, weakness, numbness and headaches.  Psychiatric/Behavioral: Negative for confusion and sleep disturbance. The patient is  nervous/anxious.     Objective:  Ht 5\' 4"  (1.626 m)   Wt 120 lb (54.4 kg)   BMI 20.60 kg/m   BP Readings from Last 3 Encounters:  01/03/20 107/60  12/19/19 102/68  11/22/19 100/60    Wt Readings from Last 3 Encounters:  01/10/20 120 lb (54.4 kg)  01/03/20 121 lb (54.9 kg)  12/19/19 126 lb 9.6 oz (57.4 kg)    Physical Exam Constitutional:      General: She is not in acute distress.    Appearance: She is well-developed.  HENT:     Head: Normocephalic.     Right Ear: External ear normal.     Left Ear: External ear normal.     Nose: Nose normal.  Eyes:     General:        Right eye: No discharge.        Left eye: No discharge.     Conjunctiva/sclera: Conjunctivae normal.     Pupils: Pupils are equal, round, and reactive to light.  Neck:     Thyroid: No thyromegaly.     Vascular: No JVD.     Trachea: No tracheal deviation.  Cardiovascular:     Rate and Rhythm: Normal rate and regular rhythm.     Heart sounds: Normal heart sounds.  Pulmonary:     Effort: No respiratory distress.     Breath sounds: No stridor. No wheezing.  Abdominal:     General: Bowel sounds are normal. There is no distension.     Palpations: Abdomen is soft. There is no mass.     Tenderness: There is no abdominal tenderness. There is no guarding or rebound.  Musculoskeletal:        General: No tenderness.     Cervical back: Normal range of motion and neck supple.  Lymphadenopathy:     Cervical: No cervical adenopathy.  Skin:    Findings: No erythema or rash.  Neurological:     Cranial Nerves: No cranial nerve deficit.     Motor: No abnormal muscle tone.     Coordination: Coordination normal.     Deep Tendon Reflexes: Reflexes normal.  Psychiatric:        Behavior: Behavior normal.        Thought Content: Thought content normal.        Judgment: Judgment normal.     Lab Results  Component Value Date   WBC 4.5 09/25/2019   HGB 12.3 09/25/2019   HCT 35.7 (L) 09/25/2019   PLT 156.0  09/25/2019   GLUCOSE 96 09/25/2019   CHOL 202 (H) 12/28/2018   TRIG 31.0 12/28/2018   HDL 93.20 12/28/2018   LDLDIRECT 134.0 01/25/2012   LDLCALC 103 (H) 12/28/2018   ALT 15 06/06/2019   AST 22 06/06/2019   NA 131 (L) 09/25/2019   K 3.9 09/25/2019   CL 98 09/25/2019   CREATININE 0.82 09/25/2019   BUN 14 09/25/2019  CO2 30 09/25/2019   TSH 2.37 06/06/2019   INR 1.0 03/28/2019    MM DIGITAL SCREENING BILATERAL  Result Date: 10/10/2019 CLINICAL DATA:  Screening. EXAM: DIGITAL SCREENING BILATERAL MAMMOGRAM WITH CAD COMPARISON:  Previous exam(s). ACR Breast Density Category c: The breast tissue is heterogeneously dense, which may obscure small masses. FINDINGS: There are no findings suspicious for malignancy. Images were processed with CAD. IMPRESSION: No mammographic evidence of malignancy. A result letter of this screening mammogram will be mailed directly to the patient. RECOMMENDATION: Screening mammogram in one year. (Code:SM-B-01Y) BI-RADS CATEGORY  1: Negative. Electronically Signed   By: Edwin Cap M.D.   On: 10/10/2019 15:36    Assessment & Plan:    . Sonda Primes, MD

## 2020-01-10 NOTE — Assessment & Plan Note (Signed)
C/o hydration problems - try Liquid I.V. Hydration Multiplier  C/o n/v w/colon prep - had a colonoscopy incomplete due to n/v

## 2020-06-05 ENCOUNTER — Other Ambulatory Visit: Payer: Self-pay

## 2020-06-06 ENCOUNTER — Other Ambulatory Visit: Payer: Self-pay

## 2020-06-06 ENCOUNTER — Ambulatory Visit (INDEPENDENT_AMBULATORY_CARE_PROVIDER_SITE_OTHER)
Admission: RE | Admit: 2020-06-06 | Discharge: 2020-06-06 | Disposition: A | Discharge status: Home / Self Care | Payer: Medicare Other | Source: Ambulatory Visit | Attending: Internal Medicine | Admitting: Internal Medicine

## 2020-06-06 ENCOUNTER — Encounter: Payer: Self-pay | Admitting: Internal Medicine

## 2020-06-06 ENCOUNTER — Ambulatory Visit (INDEPENDENT_AMBULATORY_CARE_PROVIDER_SITE_OTHER): Payer: Medicare Other | Admitting: Internal Medicine

## 2020-06-06 VITALS — BP 130/72 | HR 57 | Temp 97.2°F | Ht 64.0 in | Wt 123.0 lb

## 2020-06-06 DIAGNOSIS — M7712 Lateral epicondylitis, left elbow: Secondary | ICD-10-CM

## 2020-06-06 DIAGNOSIS — Z0000 Encounter for general adult medical examination without abnormal findings: Secondary | ICD-10-CM | POA: Diagnosis not present

## 2020-06-06 DIAGNOSIS — R42 Dizziness and giddiness: Secondary | ICD-10-CM | POA: Diagnosis not present

## 2020-06-06 DIAGNOSIS — F419 Anxiety disorder, unspecified: Secondary | ICD-10-CM

## 2020-06-06 DIAGNOSIS — F4321 Adjustment disorder with depressed mood: Secondary | ICD-10-CM | POA: Diagnosis not present

## 2020-06-06 DIAGNOSIS — Z780 Asymptomatic menopausal state: Secondary | ICD-10-CM | POA: Diagnosis not present

## 2020-06-06 DIAGNOSIS — M545 Low back pain, unspecified: Secondary | ICD-10-CM | POA: Diagnosis not present

## 2020-06-06 LAB — COMPREHENSIVE METABOLIC PANEL
ALT: 17 U/L (ref 0–35)
AST: 22 U/L (ref 0–37)
Albumin: 4 g/dL (ref 3.5–5.2)
Alkaline Phosphatase: 45 U/L (ref 39–117)
BUN: 19 mg/dL (ref 6–23)
CO2: 29 mEq/L (ref 19–32)
Calcium: 9.1 mg/dL (ref 8.4–10.5)
Chloride: 104 mEq/L (ref 96–112)
Creatinine, Ser: 0.94 mg/dL (ref 0.40–1.20)
GFR: 63.61 mL/min (ref >60.00)
Glucose, Bld: 95 mg/dL (ref 70–99)
Potassium: 4.6 mEq/L (ref 3.5–5.1)
Sodium: 135 mEq/L (ref 135–145)
Total Bilirubin: 0.4 mg/dL (ref 0.2–1.2)
Total Protein: 6.3 g/dL (ref 6.0–8.3)

## 2020-06-06 LAB — URINALYSIS, ROUTINE W REFLEX MICROSCOPIC
Bilirubin Urine: NEGATIVE
Ketones, ur: NEGATIVE
Leukocytes,Ua: NEGATIVE
Nitrite: NEGATIVE
Specific Gravity, Urine: 1.015 (ref 1.000–1.030)
Total Protein, Urine: NEGATIVE
Urine Glucose: NEGATIVE
Urobilinogen, UA: 0.2 (ref 0.0–1.0)
pH: 7.5 (ref 5.0–8.0)

## 2020-06-06 LAB — CBC WITH DIFFERENTIAL/PLATELET
Basophils Absolute: 0.1 10*3/uL (ref 0.0–0.1)
Basophils Relative: 1.3 % (ref 0.0–3.0)
Eosinophils Absolute: 0.1 10*3/uL (ref 0.0–0.7)
Eosinophils Relative: 2.1 % (ref 0.0–5.0)
HCT: 38 % (ref 36.0–46.0)
Hemoglobin: 12.9 g/dL (ref 12.0–15.0)
Lymphocytes Relative: 42.9 % (ref 12.0–46.0)
Lymphs Abs: 1.7 10*3/uL (ref 0.7–4.0)
MCHC: 33.8 g/dL (ref 30.0–36.0)
MCV: 96.5 fl (ref 78.0–100.0)
Monocytes Absolute: 0.3 10*3/uL (ref 0.1–1.0)
Monocytes Relative: 8.2 % (ref 3.0–12.0)
Neutro Abs: 1.8 10*3/uL (ref 1.4–7.7)
Neutrophils Relative %: 45.5 % (ref 43.0–77.0)
Platelets: 165 10*3/uL (ref 150.0–400.0)
RBC: 3.94 Mil/uL (ref 3.87–5.11)
RDW: 13.5 % (ref 11.5–15.5)
WBC: 4.1 10*3/uL (ref 4.0–10.5)

## 2020-06-06 LAB — LIPID PANEL
Cholesterol: 239 mg/dL — ABNORMAL HIGH (ref 0–200)
HDL: 100.4 mg/dL (ref >39.00)
LDL Cholesterol: 132 mg/dL — ABNORMAL HIGH (ref 0–99)
NonHDL: 139.03
Total CHOL/HDL Ratio: 2
Triglycerides: 33 mg/dL (ref 0.0–149.0)
VLDL: 6.6 mg/dL (ref 0.0–40.0)

## 2020-06-06 LAB — TSH: TSH: 2.94 u[IU]/mL (ref 0.35–4.50)

## 2020-06-06 MED ORDER — DIAZEPAM 2 MG PO TABS
2.0000 mg | ORAL_TABLET | Freq: Three times a day (TID) | ORAL | 1 refills | Status: DC | PRN
Start: 2020-06-06 — End: 2021-06-30

## 2020-06-06 MED ORDER — EPINEPHRINE 0.3 MG/0.3ML IJ SOAJ: 0.3000 mg | INTRAMUSCULAR | 3 refills | Status: DC | PRN

## 2020-06-06 MED ORDER — CIPROFLOXACIN HCL 250 MG PO TABS
250.0000 mg | ORAL_TABLET | Freq: Two times a day (BID) | ORAL | 0 refills | Status: DC
Start: 2020-06-06 — End: 2020-12-30

## 2020-06-06 MED ORDER — MELOXICAM 7.5 MG PO TABS: 7.5000 mg | ORAL_TABLET | Freq: Every day | ORAL | 1 refills | Status: DC | PRN

## 2020-06-06 MED ORDER — CYCLOBENZAPRINE HCL 5 MG PO TABS: 5.0000 mg | ORAL_TABLET | Freq: Three times a day (TID) | ORAL | 1 refills | Status: DC | PRN

## 2020-06-06 NOTE — Addendum Note (Signed)
Addended by: Waldemar Dickens B on: 06/06/2020 08:32 AM   Modules accepted: Orders

## 2020-06-06 NOTE — Assessment & Plan Note (Signed)
Mother died of CAP at 79 on 04/20/20 in [place] Discussed

## 2020-06-06 NOTE — Assessment & Plan Note (Signed)
Better  

## 2020-06-06 NOTE — Assessment & Plan Note (Signed)
Resolved

## 2020-06-06 NOTE — Progress Notes (Signed)
Subjective:  Patient ID: Karen [name], female    DOB: 17-Nov-1955  Age: 65 y.o. MRN: [id number]  CC: No chief complaint on file.   HPI Karen [name] presents for a f/u. Doing well. Walking, playing tennis. F/u on rectal bleed, L tennis elbow - resolved. Mother died of CAP at 36 last month   Outpatient Medications Prior to Visit  Medication Sig Dispense Refill  . Calcium-Vitamin D-Vitamin K (VIACTIV PO) Take 1 tablet by mouth 2 (two) times daily. CHEW    . ciprofloxacin (CIPRO) 250 MG tablet Take 1 tablet (250 mg total) by mouth 2 (two) times daily. 10 tablet 0  . cyclobenzaprine (FLEXERIL) 5 MG tablet Take 1 tablet (5 mg total) by mouth 3 (three) times daily as needed for muscle spasms. 90 tablet 1  . diazepam (VALIUM) 2 MG tablet Take 1 tablet (2 mg total) by mouth every 8 (eight) hours as needed for anxiety. 60 tablet 1  . diclofenac sodium (VOLTAREN) 1 % GEL Apply 4 g topically 4 (four) times daily. (Patient taking differently: Apply 4 g topically See admin instructions. Apply 4 grams to the right knee four times daily) 100 g 3  . diphenhydrAMINE (BENADRYL) 50 MG tablet Take 1 tablet (50 mg total) by mouth every 6 (six) hours as needed for itching or allergies. 30 tablet 1  . docusate sodium (COLACE) 100 MG capsule Take 1 capsule (100 mg total) by mouth 2 (two) times daily. (Patient taking differently: Take 100 mg by mouth See admin instructions. Take 100 mg by mouth one to two times daily) 10 capsule 0  . EPINEPHrine (EPI-PEN) 0.3 mg/0.3 mL DEVI Inject 0.3 mg into the muscle once as needed (anaphylaxis).     Marland Kitchen EPINEPHrine (EPIPEN 2-PAK) 0.3 mg/0.3 mL IJ SOAJ injection Inject 0.3 mLs (0.3 mg total) into the muscle as needed for anaphylaxis. 1 each 3  . estrogen-methylTESTOSTERone 0.625-1.25 MG per tablet Take 1 tablet by mouth daily. 90 tablet 3  . fexofenadine (ALLEGRA) 180 MG tablet Take 1 tablet (180 mg total) by mouth daily. 100 tablet 3  . hydrocortisone 2.5 % cream Apply 1 application  topically 2 (two) times daily as needed (for rashes).     . meclizine (ANTIVERT) 25 MG tablet Take 1 tablet (25 mg total) by mouth 3 (three) times daily as needed for dizziness. 30 tablet 0  . meloxicam (MOBIC) 7.5 MG tablet Take 1 tablet (7.5 mg total) by mouth daily as needed for pain. 90 tablet 1  . Multiple Vitamin (MULTIVITAMIN WITH MINERALS) TABS tablet Take 1 tablet by mouth daily.    Marland Kitchen olopatadine (PATANOL) 0.1 % ophthalmic solution Place 1 drop into both eyes daily as needed for allergies.     Marland Kitchen ondansetron (ZOFRAN) 4 MG tablet Take 1 tablet (4 mg total) by mouth every 8 (eight) hours as needed for nausea or vomiting. 20 tablet 0  . polyethylene glycol (MIRALAX / GLYCOLAX) packet Take 17 g by mouth daily as needed for moderate constipation. (Patient taking differently: Take 17 g by mouth daily as needed for moderate constipation (MIX AND DRINK). ) 14 each 0  . pseudoephedrine (SUDAFED) 60 MG tablet Take 1 tablet (60 mg total) by mouth every 4 (four) hours as needed (allergic reaction). 60 tablet 1  . vitamin C (ASCORBIC ACID) 500 MG tablet Take 500 mg by mouth daily.       No facility-administered medications prior to visit.    ROS: Review of Systems  Constitutional: Negative for activity  change, appetite change, chills, fatigue and unexpected weight change.  HENT: Negative for congestion, mouth sores and sinus pressure.   Eyes: Negative for visual disturbance.  Respiratory: Negative for cough and chest tightness.   Gastrointestinal: Negative for abdominal pain and nausea.  Genitourinary: Negative for difficulty urinating, frequency and vaginal pain.  Musculoskeletal: Negative for back pain and gait problem.  Skin: Negative for pallor and rash.  Neurological: Negative for dizziness, tremors, weakness, numbness and headaches.  Psychiatric/Behavioral: Negative for confusion, sleep disturbance and suicidal ideas.    Objective:  There were no vitals taken for this visit.  BP  Readings from Last 3 Encounters:  01/10/20 118/82  01/03/20 107/60  12/19/19 102/68    Wt Readings from Last 3 Encounters:  01/10/20 120 lb (54.4 kg)  01/03/20 121 lb (54.9 kg)  12/19/19 126 lb 9.6 oz (57.4 kg)    Physical Exam Constitutional:      General: She is not in acute distress.    Appearance: She is well-developed.  HENT:     Head: Normocephalic.     Right Ear: External ear normal.     Left Ear: External ear normal.     Nose: Nose normal.     Mouth/Throat:     Mouth: Oropharynx is clear and moist.  Eyes:     General:        Right eye: No discharge.        Left eye: No discharge.     Conjunctiva/sclera: Conjunctivae normal.     Pupils: Pupils are equal, round, and reactive to light.  Neck:     Thyroid: No thyromegaly.     Vascular: No JVD.     Trachea: No tracheal deviation.  Cardiovascular:     Rate and Rhythm: Normal rate and regular rhythm.     Heart sounds: Normal heart sounds.  Pulmonary:     Effort: No respiratory distress.     Breath sounds: No stridor. No wheezing.  Abdominal:     General: Bowel sounds are normal. There is no distension.     Palpations: Abdomen is soft. There is no mass.     Tenderness: There is no abdominal tenderness. There is no guarding or rebound.  Musculoskeletal:        General: No tenderness or edema.     Cervical back: Normal range of motion and neck supple.  Lymphadenopathy:     Cervical: No cervical adenopathy.  Skin:    Findings: No erythema or rash.  Neurological:     Mental Status: She is oriented to person, place, and time.     Cranial Nerves: No cranial nerve deficit.     Motor: No abnormal muscle tone.     Coordination: Coordination normal.     Deep Tendon Reflexes: Reflexes normal.  Psychiatric:        Mood and Affect: Mood and affect normal.        Behavior: Behavior normal.        Thought Content: Thought content normal.        Judgment: Judgment normal.     Lab Results  Component Value Date   WBC  4.5 09/25/2019   HGB 12.3 09/25/2019   HCT 35.7 (L) 09/25/2019   PLT 156.0 09/25/2019   GLUCOSE 96 09/25/2019   CHOL 202 (H) 12/28/2018   TRIG 31.0 12/28/2018   HDL 93.20 12/28/2018   LDLDIRECT 134.0 01/25/2012   LDLCALC 103 (H) 12/28/2018   ALT 15 06/06/2019   AST 22 06/06/2019  NA 131 (L) 09/25/2019   K 3.9 09/25/2019   CL 98 09/25/2019   CREATININE 0.82 09/25/2019   BUN 14 09/25/2019   CO2 30 09/25/2019   TSH 2.37 06/06/2019   INR 1.0 03/28/2019    MM DIGITAL SCREENING BILATERAL  Result Date: 10/10/2019 CLINICAL DATA:  Screening. EXAM: DIGITAL SCREENING BILATERAL MAMMOGRAM WITH CAD COMPARISON:  Previous exam(s). ACR Breast Density Category c: The breast tissue is heterogeneously dense, which may obscure small masses. FINDINGS: There are no findings suspicious for malignancy. Images were processed with CAD. IMPRESSION: No mammographic evidence of malignancy. A result letter of this screening mammogram will be mailed directly to the patient. RECOMMENDATION: Screening mammogram in one year. (Code:SM-B-01Y) BI-RADS CATEGORY  1: Negative. Electronically Signed   By: Edwin Cap M.D.   On: 10/10/2019 15:36    Assessment & Plan:     Follow-up: No follow-ups on file.  Sonda Primes, MD

## 2020-06-06 NOTE — Assessment & Plan Note (Signed)
Discussed Better  

## 2020-06-12 ENCOUNTER — Telehealth: Payer: Self-pay | Admitting: Internal Medicine

## 2020-06-12 NOTE — Telephone Encounter (Signed)
Patient called and said that the pharmacy sent over a PA for diazepam (VALIUM) 2 MG tablet. Please advise Please call the patient back at [phone]    [facility] [phone] - [place], [place] - [address]

## 2020-06-13 NOTE — Telephone Encounter (Signed)
Completed PA via cover-my-meds w/ (Key: [id number] ). Waiting on insurance response.Marland KitchenRaechel Chute

## 2020-06-13 NOTE — Telephone Encounter (Signed)
Rec'd fax PA on pt Diazepam.. will proceed w/PA via cover-my-meds.Marland KitchenRaechel Chute

## 2020-06-14 NOTE — Telephone Encounter (Signed)
Rec'd PA backk med has been approved w/approval dates 05/30/20 to 10/11/20. Notified pharmacy w/approval../lmb

## 2020-06-21 ENCOUNTER — Telehealth: Payer: Self-pay | Admitting: Internal Medicine

## 2020-06-21 NOTE — Telephone Encounter (Signed)
Patient wondering what she needs to do about her expired epi pen, she tried to take it to the [facility] outpatient pharmacy and they said they wouldn't take it because of the needle and no pharmacy could take it. So she reached out to [place] to see what she should do with it

## 2020-06-25 NOTE — Telephone Encounter (Signed)
   Patient wants to know if EPINEPHrine (EPIPEN 2-PAK) 0.3 mg/0.3 mL IJ SOAJ injection that is expired can go in the trash

## 2020-06-28 ENCOUNTER — Telehealth: Payer: Self-pay | Admitting: Internal Medicine

## 2020-06-28 DIAGNOSIS — E785 Hyperlipidemia, unspecified: Secondary | ICD-10-CM

## 2020-06-28 NOTE — Telephone Encounter (Signed)
   Patient requesting order for CT cardiac scoring 

## 2020-07-01 NOTE — Telephone Encounter (Signed)
MD is out of the office will hold until he returns.Marland KitchenRaechel Chute

## 2020-07-04 NOTE — Telephone Encounter (Signed)
Okay.  Done.  Thanks 

## 2020-07-04 NOTE — Telephone Encounter (Signed)
Scheduled pt for cardiac scoring and left vm for pt to call back for appointment information.

## 2020-07-30 ENCOUNTER — Ambulatory Visit (INDEPENDENT_AMBULATORY_CARE_PROVIDER_SITE_OTHER)
Admission: RE | Admit: 2020-07-30 | Discharge: 2020-07-30 | Disposition: A | Discharge status: Home / Self Care | Payer: Self-pay | Source: Ambulatory Visit | Attending: Internal Medicine | Admitting: Internal Medicine

## 2020-07-30 ENCOUNTER — Other Ambulatory Visit: Payer: Self-pay

## 2020-07-30 DIAGNOSIS — E785 Hyperlipidemia, unspecified: Secondary | ICD-10-CM

## 2020-08-30 ENCOUNTER — Other Ambulatory Visit: Payer: Self-pay | Admitting: Internal Medicine

## 2020-08-30 DIAGNOSIS — Z1231 Encounter for screening mammogram for malignant neoplasm of breast: Secondary | ICD-10-CM

## 2020-10-11 ENCOUNTER — Ambulatory Visit: Payer: Medicare Other

## 2020-12-27 ENCOUNTER — Other Ambulatory Visit: Payer: Self-pay

## 2020-12-27 ENCOUNTER — Ambulatory Visit
Admission: RE | Admit: 2020-12-27 | Discharge: 2020-12-27 | Disposition: A | Discharge status: Home / Self Care | Payer: Medicare Other | Source: Ambulatory Visit | Attending: Internal Medicine | Admitting: Internal Medicine

## 2020-12-27 DIAGNOSIS — Z1231 Encounter for screening mammogram for malignant neoplasm of breast: Secondary | ICD-10-CM

## 2020-12-30 ENCOUNTER — Ambulatory Visit (INDEPENDENT_AMBULATORY_CARE_PROVIDER_SITE_OTHER): Payer: Medicare Other | Admitting: Internal Medicine

## 2020-12-30 ENCOUNTER — Other Ambulatory Visit: Payer: Self-pay

## 2020-12-30 ENCOUNTER — Encounter: Payer: Self-pay | Admitting: Internal Medicine

## 2020-12-30 VITALS — BP 128/80 | HR 55 | Temp 98.3°F | Ht 64.0 in | Wt 124.0 lb

## 2020-12-30 DIAGNOSIS — N1831 Chronic kidney disease, stage 3a: Secondary | ICD-10-CM | POA: Diagnosis not present

## 2020-12-30 DIAGNOSIS — R7989 Other specified abnormal findings of blood chemistry: Secondary | ICD-10-CM

## 2020-12-30 DIAGNOSIS — Z23 Encounter for immunization: Secondary | ICD-10-CM | POA: Diagnosis not present

## 2020-12-30 DIAGNOSIS — J4521 Mild intermittent asthma with (acute) exacerbation: Secondary | ICD-10-CM | POA: Diagnosis not present

## 2020-12-30 DIAGNOSIS — F4321 Adjustment disorder with depressed mood: Secondary | ICD-10-CM

## 2020-12-30 DIAGNOSIS — Z0000 Encounter for general adult medical examination without abnormal findings: Secondary | ICD-10-CM

## 2020-12-30 LAB — COMPREHENSIVE METABOLIC PANEL
ALT: 17 U/L (ref 0–35)
AST: 21 U/L (ref 0–37)
Albumin: 4 g/dL (ref 3.5–5.2)
Alkaline Phosphatase: 49 U/L (ref 39–117)
BUN: 16 mg/dL (ref 6–23)
CO2: 27 mEq/L (ref 19–32)
Calcium: 9 mg/dL (ref 8.4–10.5)
Chloride: 102 mEq/L (ref 96–112)
Creatinine, Ser: 0.9 mg/dL (ref 0.40–1.20)
GFR: 66.75 mL/min (ref >60.00)
Glucose, Bld: 95 mg/dL (ref 70–99)
Potassium: 3.9 mEq/L (ref 3.5–5.1)
Sodium: 135 mEq/L (ref 135–145)
Total Bilirubin: 0.4 mg/dL (ref 0.2–1.2)
Total Protein: 6.5 g/dL (ref 6.0–8.3)

## 2020-12-30 NOTE — Progress Notes (Addendum)
Subjective:  Patient ID: Karen [name], female    DOB: Apr 13, 1956  Age: 65 y.o. MRN: [id number]  CC: Follow-up (6 month f/u- want flu shot)   HPI Aislynn Cifelli presents for a 6 months f/u: asthma, CRI and grief   Outpatient Medications Prior to Visit  Medication Sig Dispense Refill   Calcium-Vitamin D-Vitamin K (VIACTIV PO) Take 1 tablet by mouth 2 (two) times daily. CHEW     cyclobenzaprine (FLEXERIL) 5 MG tablet Take 1 tablet (5 mg total) by mouth 3 (three) times daily as needed for muscle spasms. 90 tablet 1   diazepam (VALIUM) 2 MG tablet Take 1 tablet (2 mg total) by mouth every 8 (eight) hours as needed for anxiety. 60 tablet 1   diclofenac sodium (VOLTAREN) 1 % GEL Apply 4 g topically 4 (four) times daily. (Patient taking differently: Apply 4 g topically See admin instructions. Apply 4 grams to the right knee four times daily) 100 g 3   diphenhydrAMINE (BENADRYL) 50 MG tablet Take 1 tablet (50 mg total) by mouth every 6 (six) hours as needed for itching or allergies. 30 tablet 1   docusate sodium (COLACE) 100 MG capsule Take 1 capsule (100 mg total) by mouth 2 (two) times daily. (Patient taking differently: Take 100 mg by mouth See admin instructions. Take 100 mg by mouth one to two times daily) 10 capsule 0   EPINEPHrine (EPIPEN 2-PAK) 0.3 mg/0.3 mL IJ SOAJ injection Inject 0.3 mg into the muscle as needed for anaphylaxis. 1 each 3   estrogen-methylTESTOSTERone 0.625-1.25 MG per tablet Take 1 tablet by mouth daily. 90 tablet 3   fexofenadine (ALLEGRA) 180 MG tablet Take 1 tablet (180 mg total) by mouth daily. 100 tablet 3   hydrocortisone 2.5 % cream Apply 1 application topically 2 (two) times daily as needed (for rashes).      meclizine (ANTIVERT) 25 MG tablet Take 1 tablet (25 mg total) by mouth 3 (three) times daily as needed for dizziness. 30 tablet 0   meloxicam (MOBIC) 7.5 MG tablet Take 1 tablet (7.5 mg total) by mouth daily as needed for pain. 90 tablet 1   Multiple Vitamin  (MULTIVITAMIN WITH MINERALS) TABS tablet Take 1 tablet by mouth daily.     olopatadine (PATANOL) 0.1 % ophthalmic solution Place 1 drop into both eyes daily as needed for allergies.      ondansetron (ZOFRAN) 4 MG tablet Take 1 tablet (4 mg total) by mouth every 8 (eight) hours as needed for nausea or vomiting. 20 tablet 0   polyethylene glycol (MIRALAX / GLYCOLAX) packet Take 17 g by mouth daily as needed for moderate constipation. (Patient taking differently: Take 17 g by mouth daily as needed for moderate constipation (MIX AND DRINK).) 14 each 0   pseudoephedrine (SUDAFED) 60 MG tablet Take 1 tablet (60 mg total) by mouth every 4 (four) hours as needed (allergic reaction). 60 tablet 1   vitamin C (ASCORBIC ACID) 500 MG tablet Take 500 mg by mouth daily.     ciprofloxacin (CIPRO) 250 MG tablet Take 1 tablet (250 mg total) by mouth 2 (two) times daily. (Patient not taking: Reported on 12/30/2020) 10 tablet 0   No facility-administered medications prior to visit.    ROS: Review of Systems  Constitutional:  Negative for activity change, appetite change, chills, fatigue and unexpected weight change.  HENT:  Negative for congestion, mouth sores and sinus pressure.   Eyes:  Negative for visual disturbance.  Respiratory:  Negative for cough  and chest tightness.   Gastrointestinal:  Negative for abdominal pain and nausea.  Genitourinary:  Negative for difficulty urinating, frequency and vaginal pain.  Musculoskeletal:  Negative for back pain and gait problem.  Skin:  Negative for pallor and rash.  Neurological:  Negative for dizziness, tremors, weakness, numbness and headaches.  Psychiatric/Behavioral:  Negative for confusion and sleep disturbance.    Objective:  BP 128/80 (BP Location: Left Arm)   Pulse (!) 55   Temp 98.3 F (36.8 C) (Oral)   Ht 5\' 4"  (1.626 m)   Wt 124 lb (56.2 kg)   SpO2 98%   BMI 21.28 kg/m   BP Readings from Last 3 Encounters:  12/30/20 128/80  06/06/20 130/72   01/10/20 118/82    Wt Readings from Last 3 Encounters:  12/30/20 124 lb (56.2 kg)  06/06/20 123 lb (55.8 kg)  01/10/20 120 lb (54.4 kg)    Physical Exam Constitutional:      General: She is not in acute distress.    Appearance: She is well-developed.  HENT:     Head: Normocephalic.     Right Ear: External ear normal.     Left Ear: External ear normal.     Nose: Nose normal.  Eyes:     General:        Right eye: No discharge.        Left eye: No discharge.     Conjunctiva/sclera: Conjunctivae normal.     Pupils: Pupils are equal, round, and reactive to light.  Neck:     Thyroid: No thyromegaly.     Vascular: No JVD.     Trachea: No tracheal deviation.  Cardiovascular:     Rate and Rhythm: Normal rate and regular rhythm.     Heart sounds: Normal heart sounds.  Pulmonary:     Effort: No respiratory distress.     Breath sounds: No stridor. No wheezing.  Abdominal:     General: Bowel sounds are normal. There is no distension.     Palpations: Abdomen is soft. There is no mass.     Tenderness: There is no abdominal tenderness. There is no guarding or rebound.  Musculoskeletal:        General: No tenderness.     Cervical back: Normal range of motion and neck supple. No rigidity.  Lymphadenopathy:     Cervical: No cervical adenopathy.  Skin:    Findings: No erythema or rash.  Neurological:     Mental Status: She is oriented to person, place, and time.     Cranial Nerves: No cranial nerve deficit.     Motor: No abnormal muscle tone.     Coordination: Coordination normal.     Deep Tendon Reflexes: Reflexes normal.  Psychiatric:        Behavior: Behavior normal.        Thought Content: Thought content normal.        Judgment: Judgment normal.     A total time of 30 minutes was spent preparing to see the patient, reviewing tests, x-rays, operative reports and records.  Also, obtaining history and performing comprehensive physical exam.  Additionally, counseling the  patient regarding the above listed issues -asthma, chronic renal insufficiency, grieving process.   Finally, documenting clinical information in the health records, coordination of care, educating the patient.   Lab Results  Component Value Date   WBC 4.1 06/06/2020   HGB 12.9 06/06/2020   HCT 38.0 06/06/2020   PLT 165.0 06/06/2020   GLUCOSE  95 12/30/2020   CHOL 239 (H) 06/06/2020   TRIG 33.0 06/06/2020   HDL 100.40 06/06/2020   LDLDIRECT 134.0 01/25/2012   LDLCALC 132 (H) 06/06/2020   ALT 17 12/30/2020   AST 21 12/30/2020   NA 135 12/30/2020   K 3.9 12/30/2020   CL 102 12/30/2020   CREATININE 0.90 12/30/2020   BUN 16 12/30/2020   CO2 27 12/30/2020   TSH 2.94 06/06/2020   INR 1.0 03/28/2019    No results found.  Assessment & Plan:     Sonda Primes, MD

## 2020-12-30 NOTE — Assessment & Plan Note (Addendum)
Continue with good hydration.  Check CMET. 

## 2020-12-30 NOTE — Addendum Note (Signed)
Addended by: Deatra James on: 12/30/2020 08:45 AM   Modules accepted: Orders

## 2020-12-30 NOTE — Assessment & Plan Note (Deleted)
Coronary calcium score of 0. 

## 2020-12-30 NOTE — Assessment & Plan Note (Signed)
Check LFTs 

## 2020-12-30 NOTE — Assessment & Plan Note (Addendum)
Coping better.  Discussed grieving process

## 2020-12-30 NOTE — Addendum Note (Signed)
Addended by: Waldemar Dickens B on: 12/30/2020 08:31 AM   Modules accepted: Orders

## 2021-01-14 ENCOUNTER — Encounter: Payer: Self-pay | Admitting: Internal Medicine

## 2021-01-16 NOTE — Assessment & Plan Note (Signed)
Doing well.  Treat allergies.

## 2021-05-19 ENCOUNTER — Telehealth: Payer: Self-pay | Admitting: Internal Medicine

## 2021-05-19 NOTE — Telephone Encounter (Signed)
Left message for patient to call back to schedule Medicare Annual Wellness Visit  ° °No hx of AWV eligible as of 09/18/20 ° °Please schedule at anytime with [facility] if patient calls the office back.   ° °40 Minutes appointment  ° °Any questions, please call me at [phone]  °

## 2021-06-30 ENCOUNTER — Encounter: Payer: Self-pay | Admitting: Internal Medicine

## 2021-06-30 ENCOUNTER — Ambulatory Visit (INDEPENDENT_AMBULATORY_CARE_PROVIDER_SITE_OTHER): Payer: Medicare Other | Admitting: Internal Medicine

## 2021-06-30 ENCOUNTER — Other Ambulatory Visit: Payer: Self-pay

## 2021-06-30 VITALS — BP 124/72 | HR 57 | Temp 97.9°F | Ht 64.0 in | Wt 129.2 lb

## 2021-06-30 DIAGNOSIS — N3090 Cystitis, unspecified without hematuria: Secondary | ICD-10-CM | POA: Diagnosis not present

## 2021-06-30 DIAGNOSIS — M545 Low back pain, unspecified: Secondary | ICD-10-CM | POA: Diagnosis not present

## 2021-06-30 DIAGNOSIS — F419 Anxiety disorder, unspecified: Secondary | ICD-10-CM | POA: Diagnosis not present

## 2021-06-30 DIAGNOSIS — Z0000 Encounter for general adult medical examination without abnormal findings: Secondary | ICD-10-CM

## 2021-06-30 DIAGNOSIS — M1990 Unspecified osteoarthritis, unspecified site: Secondary | ICD-10-CM

## 2021-06-30 LAB — COMPREHENSIVE METABOLIC PANEL
ALT: 19 U/L (ref 0–35)
AST: 22 U/L (ref 0–37)
Albumin: 4.2 g/dL (ref 3.5–5.2)
Alkaline Phosphatase: 50 U/L (ref 39–117)
BUN: 18 mg/dL (ref 6–23)
CO2: 27 mEq/L (ref 19–32)
Calcium: 9.2 mg/dL (ref 8.4–10.5)
Chloride: 103 mEq/L (ref 96–112)
Creatinine, Ser: 0.99 mg/dL (ref 0.40–1.20)
GFR: 59.33 mL/min — ABNORMAL LOW (ref >60.00)
Glucose, Bld: 105 mg/dL — ABNORMAL HIGH (ref 70–99)
Potassium: 4.2 mEq/L (ref 3.5–5.1)
Sodium: 136 mEq/L (ref 135–145)
Total Bilirubin: 0.4 mg/dL (ref 0.2–1.2)
Total Protein: 6.4 g/dL (ref 6.0–8.3)

## 2021-06-30 LAB — CBC WITH DIFFERENTIAL/PLATELET
Basophils Absolute: 0.1 10*3/uL (ref 0.0–0.1)
Basophils Relative: 1.7 % (ref 0.0–3.0)
Eosinophils Absolute: 0.1 10*3/uL (ref 0.0–0.7)
Eosinophils Relative: 2.4 % (ref 0.0–5.0)
HCT: 37.6 % (ref 36.0–46.0)
Hemoglobin: 12.4 g/dL (ref 12.0–15.0)
Lymphocytes Relative: 39 % (ref 12.0–46.0)
Lymphs Abs: 1.7 10*3/uL (ref 0.7–4.0)
MCHC: 33.1 g/dL (ref 30.0–36.0)
MCV: 96 fl (ref 78.0–100.0)
Monocytes Absolute: 0.5 10*3/uL (ref 0.1–1.0)
Monocytes Relative: 10.2 % (ref 3.0–12.0)
Neutro Abs: 2.1 10*3/uL (ref 1.4–7.7)
Neutrophils Relative %: 46.7 % (ref 43.0–77.0)
Platelets: 180 10*3/uL (ref 150.0–400.0)
RBC: 3.91 Mil/uL (ref 3.87–5.11)
RDW: 13.8 % (ref 11.5–15.5)
WBC: 4.4 10*3/uL (ref 4.0–10.5)

## 2021-06-30 LAB — URINALYSIS, ROUTINE W REFLEX MICROSCOPIC
Bilirubin Urine: NEGATIVE
Ketones, ur: NEGATIVE
Nitrite: NEGATIVE
Specific Gravity, Urine: 1.015 (ref 1.000–1.030)
Total Protein, Urine: NEGATIVE
Urine Glucose: NEGATIVE
Urobilinogen, UA: 0.2 (ref 0.0–1.0)
pH: 7 (ref 5.0–8.0)

## 2021-06-30 LAB — LIPID PANEL
Cholesterol: 242 mg/dL — ABNORMAL HIGH (ref 0–200)
HDL: 97.6 mg/dL (ref >39.00)
LDL Cholesterol: 135 mg/dL — ABNORMAL HIGH (ref 0–99)
NonHDL: 144.03
Total CHOL/HDL Ratio: 2
Triglycerides: 43 mg/dL (ref 0.0–149.0)
VLDL: 8.6 mg/dL (ref 0.0–40.0)

## 2021-06-30 LAB — TSH: TSH: 2.98 u[IU]/mL (ref 0.35–5.50)

## 2021-06-30 MED ORDER — DIAZEPAM 2 MG PO TABS: 2.0000 mg | ORAL_TABLET | Freq: Three times a day (TID) | ORAL | 1 refills | Status: DC | PRN

## 2021-06-30 MED ORDER — CYCLOBENZAPRINE HCL 5 MG PO TABS: 5.0000 mg | ORAL_TABLET | Freq: Three times a day (TID) | ORAL | 1 refills | Status: DC | PRN

## 2021-06-30 MED ORDER — EPINEPHRINE 0.3 MG/0.3ML IJ SOAJ: 0.3000 mg | INTRAMUSCULAR | 3 refills | Status: DC | PRN

## 2021-06-30 MED ORDER — MELOXICAM 7.5 MG PO TABS: 7.5000 mg | ORAL_TABLET | Freq: Every day | ORAL | 1 refills | Status: DC | PRN

## 2021-06-30 MED ORDER — CIPROFLOXACIN HCL 250 MG PO TABS: 250.0000 mg | ORAL_TABLET | Freq: Two times a day (BID) | ORAL | 0 refills | Status: DC

## 2021-06-30 NOTE — Assessment & Plan Note (Addendum)
We discussed age appropriate health related issues, including available/recomended screening tests and vaccinations. We discussed a need for adhering to healthy diet and exercise. Labs were ordered to be later reviewed . All questions were answered. ?Not eating red meat ?A cardiac CT scan for calcium scoring offered 2/20.  ?2022 Coronary calcium score of 0. ?Pt declined colonoscopy, Cologuard ?

## 2021-06-30 NOTE — Progress Notes (Signed)
Subjective:  Patient ID: Karen [name], female    DOB: 11-20-1955  Age: 66 y.o. MRN: [id number]  CC: Annual Exam (No concerns. )   HPI Karen [name] presents for UTIs, anxiety, LBP/OA Well exam  Outpatient Medications Prior to Visit  Medication Sig Dispense Refill   Calcium-Vitamin D-Vitamin K (VIACTIV PO) Take 1 tablet by mouth 2 (two) times daily. CHEW     diclofenac sodium (VOLTAREN) 1 % GEL Apply 4 g topically 4 (four) times daily. (Patient taking differently: Apply 4 g topically See admin instructions. Apply 4 grams to the right knee four times daily) 100 g 3   diphenhydrAMINE (BENADRYL) 50 MG tablet Take 1 tablet (50 mg total) by mouth every 6 (six) hours as needed for itching or allergies. 30 tablet 1   docusate sodium (COLACE) 100 MG capsule Take 1 capsule (100 mg total) by mouth 2 (two) times daily. (Patient taking differently: Take 100 mg by mouth See admin instructions. Take 100 mg by mouth one to two times daily) 10 capsule 0   estrogen-methylTESTOSTERone 0.625-1.25 MG per tablet Take 1 tablet by mouth daily. 90 tablet 3   fexofenadine (ALLEGRA) 180 MG tablet Take 1 tablet (180 mg total) by mouth daily. 100 tablet 3   hydrocortisone 2.5 % cream Apply 1 application topically 2 (two) times daily as needed (for rashes).      meclizine (ANTIVERT) 25 MG tablet Take 1 tablet (25 mg total) by mouth 3 (three) times daily as needed for dizziness. 30 tablet 0   Multiple Vitamin (MULTIVITAMIN WITH MINERALS) TABS tablet Take 1 tablet by mouth daily.     olopatadine (PATANOL) 0.1 % ophthalmic solution Place 1 drop into both eyes daily as needed for allergies.      ondansetron (ZOFRAN) 4 MG tablet Take 1 tablet (4 mg total) by mouth every 8 (eight) hours as needed for nausea or vomiting. 20 tablet 0   polyethylene glycol (MIRALAX / GLYCOLAX) packet Take 17 g by mouth daily as needed for moderate constipation. (Patient taking differently: Take 17 g by mouth daily as needed for moderate constipation  (MIX AND DRINK).) 14 each 0   pseudoephedrine (SUDAFED) 60 MG tablet Take 1 tablet (60 mg total) by mouth every 4 (four) hours as needed (allergic reaction). 60 tablet 1   vitamin C (ASCORBIC ACID) 500 MG tablet Take 500 mg by mouth daily.     cyclobenzaprine (FLEXERIL) 5 MG tablet Take 1 tablet (5 mg total) by mouth 3 (three) times daily as needed for muscle spasms. 90 tablet 1   diazepam (VALIUM) 2 MG tablet Take 1 tablet (2 mg total) by mouth every 8 (eight) hours as needed for anxiety. 60 tablet 1   EPINEPHrine (EPIPEN 2-PAK) 0.3 mg/0.3 mL IJ SOAJ injection Inject 0.3 mg into the muscle as needed for anaphylaxis. 1 each 3   meloxicam (MOBIC) 7.5 MG tablet Take 1 tablet (7.5 mg total) by mouth daily as needed for pain. 90 tablet 1   No facility-administered medications prior to visit.    ROS: Review of Systems  Constitutional:  Negative for activity change, appetite change, chills, fatigue and unexpected weight change.  HENT:  Negative for congestion, mouth sores and sinus pressure.   Eyes:  Negative for visual disturbance.  Respiratory:  Negative for cough and chest tightness.   Gastrointestinal:  Negative for abdominal pain and nausea.  Genitourinary:  Negative for difficulty urinating, frequency and vaginal pain.  Musculoskeletal:  Positive for back pain. Negative for arthralgias  and gait problem.  Skin:  Negative for pallor and rash.  Neurological:  Negative for dizziness, tremors, weakness, numbness and headaches.  Psychiatric/Behavioral:  Negative for confusion and sleep disturbance. The patient is nervous/anxious.    Objective:  BP 124/72    Pulse (!) 57    Temp 97.9 F (36.6 C) (Oral)    Ht 5\' 4"  (1.626 m)    Wt 129 lb 3.2 oz (58.6 kg)    SpO2 98%    BMI 22.18 kg/m   BP Readings from Last 3 Encounters:  06/30/21 124/72  12/30/20 128/80  06/06/20 130/72    Wt Readings from Last 3 Encounters:  06/30/21 129 lb 3.2 oz (58.6 kg)  12/30/20 124 lb (56.2 kg)  06/06/20 123 lb  (55.8 kg)    Physical Exam Constitutional:      General: She is not in acute distress.    Appearance: She is well-developed.  HENT:     Head: Normocephalic.     Right Ear: External ear normal.     Left Ear: External ear normal.     Nose: Nose normal.  Eyes:     General:        Right eye: No discharge.        Left eye: No discharge.     Conjunctiva/sclera: Conjunctivae normal.     Pupils: Pupils are equal, round, and reactive to light.  Neck:     Thyroid: No thyromegaly.     Vascular: No JVD.     Trachea: No tracheal deviation.  Cardiovascular:     Rate and Rhythm: Normal rate and regular rhythm.     Heart sounds: Normal heart sounds.  Pulmonary:     Effort: No respiratory distress.     Breath sounds: No stridor. No wheezing.  Abdominal:     General: Bowel sounds are normal. There is no distension.     Palpations: Abdomen is soft. There is no mass.     Tenderness: There is no abdominal tenderness. There is no guarding or rebound.  Musculoskeletal:        General: No tenderness.     Cervical back: Normal range of motion and neck supple. No rigidity.  Lymphadenopathy:     Cervical: No cervical adenopathy.  Skin:    Findings: No erythema or rash.  Neurological:     Cranial Nerves: No cranial nerve deficit.     Motor: No abnormal muscle tone.     Coordination: Coordination normal.     Deep Tendon Reflexes: Reflexes normal.  Psychiatric:        Behavior: Behavior normal.        Thought Content: Thought content normal.        Judgment: Judgment normal.    Lab Results  Component Value Date   WBC 4.1 06/06/2020   HGB 12.9 06/06/2020   HCT 38.0 06/06/2020   PLT 165.0 06/06/2020   GLUCOSE 95 12/30/2020   CHOL 239 (H) 06/06/2020   TRIG 33.0 06/06/2020   HDL 100.40 06/06/2020   LDLDIRECT 134.0 01/25/2012   LDLCALC 132 (H) 06/06/2020   ALT 17 12/30/2020   AST 21 12/30/2020   NA 135 12/30/2020   K 3.9 12/30/2020   CL 102 12/30/2020   CREATININE 0.90 12/30/2020    BUN 16 12/30/2020   CO2 27 12/30/2020   TSH 2.94 06/06/2020   INR 1.0 03/28/2019    MM 3D SCREEN BREAST BILATERAL  Result Date: 12/30/2020 CLINICAL DATA:  Screening. EXAM: DIGITAL SCREENING BILATERAL  MAMMOGRAM WITH TOMOSYNTHESIS AND CAD TECHNIQUE: Bilateral screening digital craniocaudal and mediolateral oblique mammograms were obtained. Bilateral screening digital breast tomosynthesis was performed. The images were evaluated with computer-aided detection. COMPARISON:  Previous exam(s). ACR Breast Density Category c: The breast tissue is heterogeneously dense, which may obscure small masses. FINDINGS: There are no findings suspicious for malignancy. IMPRESSION: No mammographic evidence of malignancy. A result letter of this screening mammogram will be mailed directly to the patient. RECOMMENDATION: Screening mammogram in one year. (Code:SM-B-01Y) BI-RADS CATEGORY  1: Negative. Electronically Signed   By: Sherian Rein M.D.   On: 12/30/2020 14:44    Assessment & Plan:   Problem List Items Addressed This Visit     Anxiety    Diazepam prn  Potential benefits of a long term opioids use as well as potential risks (i.e. addiction risk, apnea etc) and complications (i.e. Somnolence, constipation and others) were explained to the patient and were aknowledged.      Relevant Medications   diazepam (VALIUM) 2 MG tablet   Other Relevant Orders   CBC with Differential/Platelet   Arthritis    Meloxicam prn  Potential benefits of a long term NSAID use as well as potential risks  and complications were explained to the patient and were aknowledged. Flexeril prn      Relevant Medications   meloxicam (MOBIC) 7.5 MG tablet   cyclobenzaprine (FLEXERIL) 5 MG tablet   Other Relevant Orders   CBC with Differential/Platelet   Cystitis    Cipro for UTIs prn      Relevant Orders   CBC with Differential/Platelet   Lumbar back pain    Meloxicam prn  Potential benefits of a long term NSAID use as well  as potential risks  and complications were explained to the patient and were aknowledged. Flexeril prn      Relevant Medications   meloxicam (MOBIC) 7.5 MG tablet   cyclobenzaprine (FLEXERIL) 5 MG tablet   Other Relevant Orders   CBC with Differential/Platelet   Well adult exam - Primary    We discussed age appropriate health related issues, including available/recomended screening tests and vaccinations. We discussed a need for adhering to healthy diet and exercise. Labs were ordered to be later reviewed . All questions were answered. Not eating red meat A cardiac CT scan for calcium scoring offered 2/20.  2022 Coronary calcium score of 0. Pt declined colonoscopy, Cologuard      Relevant Orders   TSH   Urinalysis   CBC with Differential/Platelet   Lipid panel   Comprehensive metabolic panel      Meds ordered this encounter  Medications   meloxicam (MOBIC) 7.5 MG tablet    Sig: Take 1 tablet (7.5 mg total) by mouth daily as needed for pain.    Dispense:  90 tablet    Refill:  1   cyclobenzaprine (FLEXERIL) 5 MG tablet    Sig: Take 1 tablet (5 mg total) by mouth 3 (three) times daily as needed for muscle spasms.    Dispense:  90 tablet    Refill:  1   diazepam (VALIUM) 2 MG tablet    Sig: Take 1 tablet (2 mg total) by mouth every 8 (eight) hours as needed for anxiety.    Dispense:  60 tablet    Refill:  1   ciprofloxacin (CIPRO) 250 MG tablet    Sig: Take 1 tablet (250 mg total) by mouth 2 (two) times daily.    Dispense:  10 tablet  Refill:  0   EPINEPHrine (EPIPEN 2-PAK) 0.3 mg/0.3 mL IJ SOAJ injection    Sig: Inject 0.3 mg into the muscle as needed for anaphylaxis.    Dispense:  1 each    Refill:  3      Follow-up: Return for a follow-up visit.  Walker Kehr, MD

## 2021-06-30 NOTE — Assessment & Plan Note (Signed)
Cipro for UTIs prn ?

## 2021-06-30 NOTE — Assessment & Plan Note (Signed)
Meloxicam prn ? Potential benefits of a long term NSAID use as well as potential risks  and complications were explained to the patient and were aknowledged. ?Flexeril prn ?

## 2021-06-30 NOTE — Assessment & Plan Note (Signed)
Diazepam prn  Potential benefits of a long term opioids use as well as potential risks (i.e. addiction risk, apnea etc) and complications (i.e. Somnolence, constipation and others) were explained to the patient and were aknowledged.  

## 2021-06-30 NOTE — Patient Instructions (Signed)
For a mild COVID-19 case - take zinc 50 mg a day for 1 week, vitamin C 1000 mg daily for 1 week, vitamin D2 50,000 units weekly for 2 months (unless  taking vitamin D daily already), an antioxidant Quercetin 500 mg twice a day for 1 week (if you can get it quick enough). Take Allegra or Benadryl.  Maintain good oral hydration and take Tylenol for high fever.  Call if problems. Isolate for 5 days, then wear a mask for 5 days per CDC.  

## 2021-06-30 NOTE — Assessment & Plan Note (Signed)
Meloxicam prn ? Potential benefits of a long term NSAID use as well as potential risks  and complications were explained to the patient and were aknowledged. ?Flexeril prn ?

## 2021-07-16 ENCOUNTER — Encounter: Payer: Self-pay | Admitting: Internal Medicine

## 2021-09-15 ENCOUNTER — Emergency Department (HOSPITAL_COMMUNITY): Payer: Medicare Other

## 2021-09-15 ENCOUNTER — Other Ambulatory Visit: Payer: Self-pay

## 2021-09-15 ENCOUNTER — Emergency Department (HOSPITAL_COMMUNITY)
Admission: EM | Admit: 2021-09-15 | Discharge: 2021-09-15 | Disposition: A | Discharge status: Home / Self Care | Payer: Medicare Other | Attending: Emergency Medicine | Admitting: Emergency Medicine

## 2021-09-15 ENCOUNTER — Encounter (HOSPITAL_COMMUNITY): Payer: Self-pay | Admitting: Emergency Medicine

## 2021-09-15 DIAGNOSIS — R5381 Other malaise: Secondary | ICD-10-CM | POA: Insufficient documentation

## 2021-09-15 DIAGNOSIS — R531 Weakness: Secondary | ICD-10-CM | POA: Diagnosis not present

## 2021-09-15 DIAGNOSIS — M545 Low back pain, unspecified: Secondary | ICD-10-CM | POA: Insufficient documentation

## 2021-09-15 DIAGNOSIS — M4716 Other spondylosis with myelopathy, lumbar region: Secondary | ICD-10-CM

## 2021-09-15 DIAGNOSIS — R1031 Right lower quadrant pain: Secondary | ICD-10-CM | POA: Insufficient documentation

## 2021-09-15 DIAGNOSIS — R29898 Other symptoms and signs involving the musculoskeletal system: Secondary | ICD-10-CM | POA: Insufficient documentation

## 2021-09-15 DIAGNOSIS — R110 Nausea: Secondary | ICD-10-CM | POA: Diagnosis not present

## 2021-09-15 LAB — COMPREHENSIVE METABOLIC PANEL
ALT: 17 U/L (ref 0–44)
AST: 23 U/L (ref 15–41)
Albumin: 3.9 g/dL (ref 3.5–5.0)
Alkaline Phosphatase: 50 U/L (ref 38–126)
Anion gap: 5 (ref 5–15)
BUN: 13 mg/dL (ref 8–23)
CO2: 27 mmol/L (ref 22–32)
Calcium: 9.1 mg/dL (ref 8.9–10.3)
Chloride: 104 mmol/L (ref 98–111)
Creatinine, Ser: 1.28 mg/dL — ABNORMAL HIGH (ref 0.44–1.00)
GFR, Estimated: 46 mL/min — ABNORMAL LOW (ref >60)
Glucose, Bld: 128 mg/dL — ABNORMAL HIGH (ref 70–99)
Potassium: 4.1 mmol/L (ref 3.5–5.1)
Sodium: 136 mmol/L (ref 135–145)
Total Bilirubin: 0.5 mg/dL (ref 0.3–1.2)
Total Protein: 6.6 g/dL (ref 6.5–8.1)

## 2021-09-15 LAB — CBC WITH DIFFERENTIAL/PLATELET
Abs Immature Granulocytes: 0.02 10*3/uL (ref 0.00–0.07)
Basophils Absolute: 0.1 10*3/uL (ref 0.0–0.1)
Basophils Relative: 1 %
Eosinophils Absolute: 0.1 10*3/uL (ref 0.0–0.5)
Eosinophils Relative: 1 %
HCT: 37.4 % (ref 36.0–46.0)
Hemoglobin: 12.5 g/dL (ref 12.0–15.0)
Immature Granulocytes: 0 %
Lymphocytes Relative: 34 %
Lymphs Abs: 1.8 10*3/uL (ref 0.7–4.0)
MCH: 32.2 pg (ref 26.0–34.0)
MCHC: 33.4 g/dL (ref 30.0–36.0)
MCV: 96.4 fL (ref 80.0–100.0)
Monocytes Absolute: 0.4 10*3/uL (ref 0.1–1.0)
Monocytes Relative: 8 %
Neutro Abs: 2.9 10*3/uL (ref 1.7–7.7)
Neutrophils Relative %: 56 %
Platelets: 205 10*3/uL (ref 150–400)
RBC: 3.88 MIL/uL (ref 3.87–5.11)
RDW: 12.8 % (ref 11.5–15.5)
WBC: 5.3 10*3/uL (ref 4.0–10.5)
nRBC: 0 % (ref 0.0–0.2)

## 2021-09-15 LAB — URINALYSIS, ROUTINE W REFLEX MICROSCOPIC
Bilirubin Urine: NEGATIVE
Glucose, UA: NEGATIVE mg/dL
Hgb urine dipstick: NEGATIVE
Ketones, ur: NEGATIVE mg/dL
Leukocytes,Ua: NEGATIVE
Nitrite: NEGATIVE
Protein, ur: NEGATIVE mg/dL
Specific Gravity, Urine: 1.018 (ref 1.005–1.030)
pH: 8 (ref 5.0–8.0)

## 2021-09-15 LAB — MAGNESIUM: Magnesium: 2.1 mg/dL (ref 1.7–2.4)

## 2021-09-15 MED ORDER — ACETAMINOPHEN 500 MG PO TABS
1000.0000 mg | ORAL_TABLET | Freq: Once | ORAL | Status: AC
  Administered 2021-09-15: 1000 mg via ORAL
  Filled 2021-09-15: qty 2

## 2021-09-15 MED ORDER — CYCLOBENZAPRINE HCL 10 MG PO TABS: 10.0000 mg | ORAL_TABLET | Freq: Two times a day (BID) | ORAL | 0 refills | Status: DC | PRN

## 2021-09-15 MED ORDER — MELOXICAM 7.5 MG PO TABS: 7.5000 mg | ORAL_TABLET | Freq: Every day | ORAL | 0 refills | Status: DC

## 2021-09-15 NOTE — ED Provider Notes (Signed)
Emergency Medicine Observation Re-evaluation Note  Karen [name] is a 66 y.o. female, seen on rounds today.  Pt initially presented to the ED for complaints of back pain, weakness in right leg. Workup thus far shows CT scan with some DJD spine.   Concerning findings are as following - R leg weakness. Important pending results are MRI of the spine  I reassessed the patient.  She is indicating that she was unable to move her right leg yesterday, but now she is able to move.  It still feels a little off.  She is not having any severe pain right now.  She has been having bilateral cramping in her legs with any kind of activity.  She was wondering if she is dehydrated.  I doubt that patient's symptoms are provoked by dehydration.  Her blood work looks reassuring.  MRI was ordered by Dr. Pilar Plate -MRI shows significant degenerative spine disease or L4-L5 region, likely worse spinal stenosis over the right side.  Results discussed with the patient and her husband.  Patient is already shown me 4+ lower extremity strength now bilaterally. I have visualized the CT scan results from earlier. I have independently interpreted and visualized the MRI of the lumbar spine.  There is no clear cauda equina.  We will discharge the patient with follow-up with PCP.  Physical Exam  BP 123/66 (BP Location: Left Arm)   Pulse (!) 50   Temp 98.5 F (36.9 C) (Oral)   Resp 16   SpO2 98%  Physical Exam General: No acute distress Cardiac: Regular rate Lungs: No respiratory distress Musculoskeletal: Lower extremity strength bilaterally is 4+ out of 5.  Patient will to flex her knee.      Derwood Kaplan, MD 09/15/21 1134

## 2021-09-15 NOTE — ED Triage Notes (Signed)
Pt reported to ED with c/o sudden right lower back pain and spasming. Pt states she was eating dinner and onset gradually worsened. Pt states she feels nauseated and very thirsty. States both of her lower extremities are "shutting down".

## 2021-09-15 NOTE — ED Provider Notes (Deleted)
  Physical Exam  BP 140/70   Pulse (!) 54   Temp 98.5 F (36.9 C) (Oral)   Resp 16   SpO2 99%   Physical Exam  Procedures  Procedures  ED Course / MDM    Medical Decision Making Amount and/or Complexity of Data Reviewed Radiology: ordered.  Risk OTC drugs.   Assuming care of patient from Dr. Pilar Plate.   Patient in the ED for back pain, weakness in leg. Workup thus far shows CT scan with some DJD spine.  Concerning findings are as following - R leg weakness. Important pending results are MRI of the spine  According to Dr. Pilar Plate, plan is to to reassess post MRI.   Patient had no complains, no concerns from the nursing side. Will continue to monitor.        Derwood Kaplan, MD 09/15/21 [phone]

## 2021-09-15 NOTE — ED Notes (Signed)
Pt endorses bilateral leg cramps, worse with movement. States she is unsure if she is dehydrated. Pt has been NPO since being here.

## 2021-09-15 NOTE — ED Notes (Signed)
Patient transported to MRI 
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
## 2021-09-15 NOTE — ED Provider Triage Note (Signed)
Emergency Medicine Provider Triage Evaluation Note  Karen [name] , a 66 y.o. female  was evaluated in triage.  Pt complains of dull right lower back pain that began around 7 PM.  This progress later in the evening to bilateral lower back pain.  Now she says that she is having some difficulty moving her right leg.  Says the pain is more severe when she tries to twist or move the right side of her body.  Localizes the discomfort into the groin area.  Also concerned about a bruise on her right palm  Also reports that she plays tennis, bikes and is very active.  The same thing happened 4 to 5 weeks ago and her muscles became very sore.  She spoke to a friend who said she was probably dehydrated so she went to an IV clinic.  She got 2 bags of fluids and rested however resumed activity earlier this week.  She is concerned that she may be dehydrated.  Review of Systems  Positive: Lower extremity aches, right lower back pain. Negative: Chest pain, back pain, shortness of breath  Physical Exam  BP (!) 159/87   Pulse (!) 56   Temp 98.5 F (36.9 C) (Oral)   Resp 17   SpO2 100%  Gen:   Awake, no distress   Resp:  Normal effort  MSK:   Patient says she is unable to lift her right leg however she is able to flex the hip, just reports severe pain in the groin.  Other:  Tenderness in the right groin.  Negative CVA bilaterally.  Mild tenderness in the right upper, right lower and suprapubic regions.  Strong DP pulses bilaterally.  Patient is able to move bilateral lower extremities.  No sensation deficit.  Medical Decision Making  Medically screening exam initiated at 1:56 AM.  Appropriate orders placed.  Karen [name] was informed that the remainder of the evaluation will be completed by another provider, this initial triage assessment does not replace that evaluation, and the importance of remaining in the ED until their evaluation is complete.    Asked by RN to see the patient to rule out dissection.   Patient is well-appearing, intact, without CB/   Jadiel Schmieder A, PA-C 09/15/21 0200

## 2021-09-15 NOTE — Discharge Instructions (Addendum)
Please take the medicines prescribed and see your doctor in 1 week to see if you need further workup or more physical therapy.

## 2021-09-15 NOTE — ED Notes (Signed)
Pt able to ambulate from triage room to bathroom with strong and steady gait.

## 2021-09-16 ENCOUNTER — Encounter (HOSPITAL_COMMUNITY): Payer: Self-pay | Admitting: Emergency Medicine

## 2021-09-16 ENCOUNTER — Ambulatory Visit (HOSPITAL_COMMUNITY)
Admission: EM | Admit: 2021-09-16 | Discharge: 2021-09-16 | Disposition: A | Discharge status: Home / Self Care | Payer: Medicare Other | Attending: Emergency Medicine | Admitting: Emergency Medicine

## 2021-09-16 DIAGNOSIS — R1031 Right lower quadrant pain: Secondary | ICD-10-CM | POA: Diagnosis not present

## 2021-09-16 LAB — POCT URINALYSIS DIPSTICK, ED / UC
Bilirubin Urine: NEGATIVE
Glucose, UA: NEGATIVE mg/dL
Ketones, ur: NEGATIVE mg/dL
Nitrite: NEGATIVE
Protein, ur: NEGATIVE mg/dL
Specific Gravity, Urine: 1.015 (ref 1.005–1.030)
Urobilinogen, UA: 0.2 mg/dL (ref 0.0–1.0)
pH: 7.5 (ref 5.0–8.0)

## 2021-09-16 NOTE — ED Provider Notes (Signed)
[place]    CSN: [id number] Arrival date & time: 09/16/21  [id number]      History   Chief Complaint Chief Complaint  Patient presents with   Abdominal Pain    HPI Karen [name] is a 66 y.o. female.   Patient presents with constant right lower quadrant abdominal pressure for 3 days.  Symptoms are worsened by sitting and lying.  Pain does not radiate, rated a 10 out of 10, described as pressure.  Associated bloating, nausea without vomiting.  Last bowel movement 2 days ago, Typically occurring every 1 to 2 days.  Attempted use of Tylenol and Aleve which has not been as active.  Was evaluated in the emergency department for back pain 1 day ago patient has, was having abdominal pain at that time but endorses that it was not addressed.  Denies fever, chills, vomiting, diarrhea, constipation, urinary symptoms, vaginal symptoms.  Past Medical History:  Diagnosis Date   Arthritis    right   Climacteric    on HRT   Complication of anesthesia    Epiglottitis 1959   tracheotomy   Finger pain    pain with pressure on the PIP joint with hemorrhage and swelling. MRI hand inconclusive    History of varicella    Lumbar back pain 1999    initial strain. Intermittent pain since. Re-injured Dec '11. Had PT IN THE PAST.   Plantar fasciitis    Pneumonia 1993   hospitalized   PONV (postoperative nausea and vomiting)     Patient Active Problem List   Diagnosis Date Noted   Nausea 01/10/2020   Superficial bruising of finger 01/10/2020   Anxiety 09/29/2019   Rectal bleeding 09/25/2019   Chronic renal insufficiency 06/07/2019   Vertigo 06/06/2019   Tennis elbow 06/06/2019   Knee pain 12/28/2018   Onychomycosis 06/02/2018   Cystitis 03/29/2018   Elevated LFTs 03/29/2018   Elevated serum creatinine 02/14/2018   Fatigue 01/28/2018   Toe pain 01/28/2018   Acute perforated appendicitis 08/26/2017   Perforated appendicitis 08/26/2017   Upper airway cough syndrome 10/16/2016    Asthmatic bronchitis 07/31/2016   Microhematuria 06/04/2016   Well adult exam 05/27/2016   Paresthesia 05/27/2016   Tinea pedis 05/27/2016   Laryngitis 03/24/2016   Pain in joint, shoulder region 03/24/2016   Chest pain 01/01/2016   Allergic rhinitis 08/06/2015   Allergic conjunctivitis 08/06/2015   Grief 08/06/2015   Anaphylactic reaction 05/02/2015   Screen for colon cancer 05/02/2015   Muscle spasms of neck 10/14/2014   Left groin pain 01/26/2012   Foot cramps 01/26/2012   Pneumonia, organism unspecified(486) 08/04/2011   Healthcare maintenance 11/23/2010   Arthritis    Lumbar back pain    Finger pain    Surgical menopause     Past Surgical History:  Procedure Laterality Date   ABDOMINAL HYSTERECTOMY     endometriosis   APPENDECTOMY  08/26/2017   ARTHROSCOPIC REPAIR ACL  1995   right-reconstructed with patellar tendon   FOOT SURGERY  1999   left foot-arch ligament release (metatarsal release ?)   LAPAROSCOPIC APPENDECTOMY N/A 08/26/2017   Procedure: APPENDECTOMY LAPAROSCOPIC;  Surgeon: Greer Pickerel, MD;  Location: [facility];  Service: General;  Laterality: N/A;   [facility]   right medial/ open   REDUCTION MAMMAPLASTY Bilateral 1976   reduction mammoplasty  1975   TOTAL ABDOMINAL HYSTERECTOMY W/ BILATERAL SALPINGOOPHORECTOMY  '02    OB History   No obstetric history on file.  Home Medications    Prior to Admission medications   Medication Sig Start Date End Date Taking? Authorizing Provider  Calcium-Vitamin D-Vitamin K (VIACTIV PO) Take 1 tablet by mouth 2 (two) times daily. CHEW    [provider]  ciprofloxacin (CIPRO) 250 MG tablet Take 1 tablet (250 mg total) by mouth 2 (two) times daily. 06/30/21   Plotnikov, Evie Lacks, MD  cyclobenzaprine (FLEXERIL) 10 MG tablet Take 1 tablet (10 mg total) by mouth 2 (two) times daily as needed for muscle spasms. 09/15/21   Varney Biles, MD  diazepam (VALIUM) 2 MG tablet Take 1 tablet (2 mg total) by  mouth every 8 (eight) hours as needed for anxiety. 06/30/21   Plotnikov, Evie Lacks, MD  diclofenac sodium (VOLTAREN) 1 % GEL Apply 4 g topically 4 (four) times daily. Patient taking differently: Apply 4 g topically See admin instructions. Apply 4 grams to the right knee four times daily 12/28/18   Plotnikov, Evie Lacks, MD  diphenhydrAMINE (BENADRYL) 50 MG tablet Take 1 tablet (50 mg total) by mouth every 6 (six) hours as needed for itching or allergies. 05/28/17   Plotnikov, Evie Lacks, MD  docusate sodium (COLACE) 100 MG capsule Take 1 capsule (100 mg total) by mouth 2 (two) times daily. Patient taking differently: Take 100 mg by mouth See admin instructions. Take 100 mg by mouth one to two times daily 09/01/17   Meuth, Brooke A, PA-C  EPINEPHrine (EPIPEN 2-PAK) 0.3 mg/0.3 mL IJ SOAJ injection Inject 0.3 mg into the muscle as needed for anaphylaxis. 06/30/21   Plotnikov, Evie Lacks, MD  estrogen-methylTESTOSTERone 0.625-1.25 MG per tablet Take 1 tablet by mouth daily. 02/02/14   Elby Showers, MD  fexofenadine (ALLEGRA) 180 MG tablet Take 1 tablet (180 mg total) by mouth daily. 05/28/17   Plotnikov, Evie Lacks, MD  hydrocortisone 2.5 % cream Apply 1 application topically 2 (two) times daily as needed (for rashes).     [provider]  meclizine (ANTIVERT) 25 MG tablet Take 1 tablet (25 mg total) by mouth 3 (three) times daily as needed for dizziness. 03/29/19   Horton, Barbette Hair, MD  meloxicam (MOBIC) 7.5 MG tablet Take 1 tablet (7.5 mg total) by mouth daily. 09/15/21   Varney Biles, MD  Multiple Vitamin (MULTIVITAMIN WITH MINERALS) TABS tablet Take 1 tablet by mouth daily.    [provider]  olopatadine (PATANOL) 0.1 % ophthalmic solution Place 1 drop into both eyes daily as needed for allergies.     [provider]  ondansetron (ZOFRAN) 4 MG tablet Take 1 tablet (4 mg total) by mouth every 8 (eight) hours as needed for nausea or vomiting. 01/10/20   Plotnikov, Evie Lacks, MD   polyethylene glycol (MIRALAX / GLYCOLAX) packet Take 17 g by mouth daily as needed for moderate constipation. Patient taking differently: Take 17 g by mouth daily as needed for moderate constipation (MIX AND DRINK). 09/01/17   Meuth, Blaine Hamper, PA-C  pseudoephedrine (SUDAFED) 60 MG tablet Take 1 tablet (60 mg total) by mouth every 4 (four) hours as needed (allergic reaction). 06/01/17   Plotnikov, Evie Lacks, MD  vitamin C (ASCORBIC ACID) 500 MG tablet Take 500 mg by mouth daily.    [provider]    Family History Family History  Problem Relation Age of Onset   Hypertension Mother    Heart disease Father        CABG   Hyperlipidemia Father    Hypertension Father    Obesity  Father    Gout Father    Alcohol abuse Father    HIV Brother    Cancer Neg Hx    Diabetes Neg Hx    Esophageal cancer Neg Hx    Colon cancer Neg Hx    Rectal cancer Neg Hx    Stomach cancer Neg Hx     Social History Social History   Tobacco Use   Smoking status: Never   Smokeless tobacco: Never  Vaping Use   Vaping Use: Never used  Substance Use Topics   Alcohol use: No   Drug use: No     Allergies   Penicillins, Hydrocodone, Meclizine, Singulair [montelukast sodium], and Adhesive [tape]   Review of Systems Review of Systems  Constitutional: Negative.   Cardiovascular: Negative.   Gastrointestinal:  Positive for abdominal pain. Negative for abdominal distention, anal bleeding, blood in stool, constipation, diarrhea, nausea, rectal pain and vomiting.  Genitourinary: Negative.   Skin: Negative.   Neurological: Negative.     Physical Exam Triage Vital Signs ED Triage Vitals  Enc Vitals Group     BP 09/16/21 0839 120/70     Pulse Rate 09/16/21 0839 64     Resp 09/16/21 0839 17     Temp 09/16/21 0839 97.9 F (36.6 C)     Temp Source 09/16/21 0839 Oral     SpO2 09/16/21 0839 98 %     Weight --      Height --      Head Circumference --      Peak Flow --      Pain Score 09/16/21  0837 7     Pain Loc --      Pain Edu? --      Excl. in [place]? --    No data found.  Updated Vital Signs BP 120/70 (BP Location: Left Arm)   Pulse 64   Temp 97.9 F (36.6 C) (Oral)   Resp 17   SpO2 98%   Visual Acuity Right Eye Distance:   Left Eye Distance:   Bilateral Distance:    Right Eye Near:   Left Eye Near:    Bilateral Near:     Physical Exam Constitutional:      Appearance: She is well-developed.  HENT:     Head: Normocephalic.  Eyes:     Extraocular Movements: Extraocular movements intact.  Pulmonary:     Effort: Pulmonary effort is normal.  Abdominal:     General: Abdomen is flat. Bowel sounds are normal.     Palpations: Abdomen is soft.     Tenderness: There is abdominal tenderness in the right lower quadrant and suprapubic area. There is no right CVA tenderness or left CVA tenderness.  Skin:    General: Skin is warm and dry.  Neurological:     General: No focal deficit present.     Mental Status: She is alert and oriented to person, place, and time.  Psychiatric:        Mood and Affect: Mood normal.        Behavior: Behavior normal.     UC Treatments / Results  Labs (all labs ordered are listed, but only abnormal results are displayed) Labs Reviewed  POCT URINALYSIS DIPSTICK, ED / UC    EKG   Radiology MR LUMBAR SPINE WO CONTRAST  Result Date: 09/15/2021 CLINICAL DATA:  Acute lumbar myelopathy. Bilateral lower leg cramping. EXAM: MRI LUMBAR SPINE WITHOUT CONTRAST TECHNIQUE: Multiplanar, multisequence MR imaging of the lumbar spine was performed.  No intravenous contrast was administered. COMPARISON:  Lumbar spine CT from earlier the same day FINDINGS: Segmentation:  5 lumbar type vertebrae Alignment:  Anterolisthesis at L4-5 Vertebrae:  No fracture, evidence of discitis, or bone lesion. Conus medullaris and cauda equina: Conus extends to the L1-2 level. Conus and cauda equina appear normal. Paraspinal and other soft tissues: Negative for  perispinal mass or inflammation Disc levels: L2-L3: Right foraminal disc bulging.  No neural compression L3-L4: Disc narrowing and foraminal predominant bulging. No neural compression L4-L5: Degenerative facet spurring with anterolisthesis. The disc is narrowed and bulging. Moderate thecal sac narrowing, either L5 nerve root could be affected in the subarticular recesses. Right more than left foraminal narrowing, noncompressive based on axial images L5-S1:Mild facet spurring and disc bulging. IMPRESSION: 1. No acute finding. 2. Lumbar spine degeneration most notable at L4-5 where there is anterolisthesis and moderate spinal stenosis. Either L5 nerve root could be affected in the subarticular recesses. Electronically Signed   By: Jorje Guild M.D.   On: 09/15/2021 10:11   CT L-SPINE NO CHARGE  Result Date: 09/15/2021 CLINICAL DATA:  Dull lower back pain beginning at 1900 hours, now progressing. EXAM: CT Lumbar Spine without contrast TECHNIQUE: Technique: Multiplanar CT images of the lumbar spine were reconstructed from contemporary CT of the Abdomen and Pelvis. RADIATION DOSE REDUCTION: This exam was performed according to the departmental dose-optimization program which includes automated exposure control, adjustment of the mA and/or kV according to patient size and/or use of iterative reconstruction technique. CONTRAST:  None COMPARISON:  None Available. FINDINGS: Segmentation: 5 lumbar type vertebrae based on the lowest ribs Alignment: Degenerative grade 1 anterolisthesis at L4-5. Vertebrae: No acute fracture or focal pathologic process. Paraspinal and other soft tissues: Reported on source images. Disc levels: L2-L3: Mild bulging of the ossified annulus L3-L4: Mild bulging of the ossified annulus, specially at the foramina. L4-L5: Facet osteoarthritis with spurring and anterolisthesis. Mild disc space narrowing with circumferential bulging of the ossified annulus. Moderate spinal stenosis. Mild bilateral  foraminal narrowing L5-S1:Mild bulging of the disc.  Minimal facet spurring IMPRESSION: 1. No acute finding. 2. Lumbar spine degeneration most notable at L4-5 where there is anterolisthesis and moderate spinal stenosis. Electronically Signed   By: Jorje Guild M.D.   On: 09/15/2021 05:49   CT Renal Stone Study  Result Date: 09/15/2021 CLINICAL DATA:  The lower back pain onset at 1900 hours, now progressing bilaterally EXAM: CT ABDOMEN AND PELVIS WITHOUT CONTRAST TECHNIQUE: Multidetector CT imaging of the abdomen and pelvis was performed following the standard protocol without IV contrast. RADIATION DOSE REDUCTION: This exam was performed according to the departmental dose-optimization program which includes automated exposure control, adjustment of the mA and/or kV according to patient size and/or use of iterative reconstruction technique. COMPARISON:  08/26/2017 FINDINGS: Lower chest:  No contributory findings. Hepatobiliary: No focal liver abnormality.No evidence of biliary obstruction or stone. Pancreas: Unremarkable. Spleen: Unremarkable. Adrenals/Urinary Tract: Negative adrenals. No hydronephrosis or ureteral stone. 2 mm left lower pole calculus. Unremarkable bladder. Stomach/Bowel: Diffuse colonic stool. No obstructive pattern. Appendectomy sutures. No definite bowel wall thickening. Vascular/Lymphatic: Mild atheromatous calcification. No mass or adenopathy. Reproductive:Hysterectomy Other: No ascites or pneumoperitoneum. Musculoskeletal: Dedicated lumbar spine reformats are reported separately. No acute osseous finding IMPRESSION: 1. No acute finding.  No hydronephrosis or ureteral calculus. 2. Diffuse colonic stool. 3. 2 mm left renal calculus Electronically Signed   By: Jorje Guild M.D.   On: 09/15/2021 06:29    Procedures Procedures (including critical care time)  Medications Ordered in UC Medications - No data to display  Initial Impression / Assessment and Plan / UC Course  I have  reviewed the triage vital signs and the nursing notes.  Pertinent labs & imaging results that were available during my care of the patient were reviewed by me and considered in my medical decision making (see chart for details).  Right lower quadrant abdominal pain  Unknown etiology, discussed with patient, vital signs are stable and well visibly comfortable she is in no signs of distress at this time, tenderness noted to the suprapubic and right lower quadrant region, as she was in the emergency department 1 day ago a mild rash to, urinalysis at that time negative and CT renal study able to capture additional organs, no abnormalities noted outside of a 2 mm left renal stone and colonic stool without obstruction, discussed with patient, repeat urinalysis completed today negative, advised patient to attempt to have a good bowel movement with beginning MiraLAX daily until this occurs as she endorses that she takes a daily stool softener, may use Tylenol, NSAIDs and warm compresses for general comfort, if symptoms continue to persist after she is to go to the nearest emergency department for reevaluation of symptoms and further management, verbalized understanding  Final Clinical Impressions(s) / UC Diagnoses   Final diagnoses:  None   Discharge Instructions   None    ED Prescriptions   None    PDMP not reviewed this encounter.   Hans Eden, [place] 09/16/21 [phone]

## 2021-09-16 NOTE — ED Triage Notes (Signed)
Pt reports in ED for past 2 days for her back. Reports that having RLQ pains that never got addressed. Pain is constant and reports a lot of pressure and nothing makes better. Reports nausea for past couple days.

## 2021-09-16 NOTE — Discharge Instructions (Addendum)
Urinalysis today is negative  On your CT yesterday completed in the emergency department the only abnormalities was a 2 mm stone on the left side and stool within your intestines  I would like you to attempt to have a good bowel movement to see if that resolves her discomfort, you may use MiraLAX daily until bowel movement occurs  If your symptoms continue to persist or worsen you will need reevaluation, please go to the nearest emergency department, and for relation for the [facility] [place] is listed on your paperwork  May continue use of Tylenol and ibuprofen for general discomfort  You may use over-the-counter Pepto-Bismol or Maalox to attempt to reduce any stomach acid or gas  You may use warm compresses over the affected area in 10 to 15-minute intervals for additional comfort

## 2021-09-17 ENCOUNTER — Encounter: Payer: Self-pay | Admitting: Internal Medicine

## 2021-09-17 ENCOUNTER — Ambulatory Visit (INDEPENDENT_AMBULATORY_CARE_PROVIDER_SITE_OTHER): Payer: Medicare Other | Admitting: Internal Medicine

## 2021-09-17 DIAGNOSIS — M5417 Radiculopathy, lumbosacral region: Secondary | ICD-10-CM

## 2021-09-17 DIAGNOSIS — R944 Abnormal results of kidney function studies: Secondary | ICD-10-CM | POA: Diagnosis not present

## 2021-09-17 DIAGNOSIS — M4800 Spinal stenosis, site unspecified: Secondary | ICD-10-CM | POA: Insufficient documentation

## 2021-09-17 DIAGNOSIS — K5909 Other constipation: Secondary | ICD-10-CM | POA: Insufficient documentation

## 2021-09-17 DIAGNOSIS — M48061 Spinal stenosis, lumbar region without neurogenic claudication: Secondary | ICD-10-CM

## 2021-09-17 DIAGNOSIS — R1900 Intra-abdominal and pelvic swelling, mass and lump, unspecified site: Secondary | ICD-10-CM | POA: Insufficient documentation

## 2021-09-17 MED ORDER — TRAMADOL HCL 50 MG PO TABS: 50.0000 mg | ORAL_TABLET | Freq: Four times a day (QID) | ORAL | 1 refills | Status: AC | PRN

## 2021-09-17 NOTE — Progress Notes (Signed)
Subjective:  Patient ID: Verita Schneiders, female    DOB: 1955-10-12  Age: 66 y.o. MRN: [id number]  CC: No chief complaint on file.   HPI Ginamarie Allbee presents for a sudden onset R LS spine severe pain irrad to RLQ, R land L leg pain. Both calves had charlie horses. starting on Sunday and R leg weakness. No numbness. Pt went to ER - she had spine CT and LS MRI - L5 spinal stenosis was found.  Pt had IV vitamins and aminoacids in April from [facility], FNP-C ([facility])  Outpatient Medications Prior to Visit  Medication Sig Dispense Refill   Calcium-Vitamin D-Vitamin K (VIACTIV PO) Take 1 tablet by mouth 2 (two) times daily. CHEW     ciprofloxacin (CIPRO) 250 MG tablet Take 1 tablet (250 mg total) by mouth 2 (two) times daily. 10 tablet 0   cyclobenzaprine (FLEXERIL) 10 MG tablet Take 1 tablet (10 mg total) by mouth 2 (two) times daily as needed for muscle spasms. 20 tablet 0   diazepam (VALIUM) 2 MG tablet Take 1 tablet (2 mg total) by mouth every 8 (eight) hours as needed for anxiety. 60 tablet 1   diclofenac sodium (VOLTAREN) 1 % GEL Apply 4 g topically 4 (four) times daily. (Patient taking differently: Apply 4 g topically See admin instructions. Apply 4 grams to the right knee four times daily) 100 g 3   diphenhydrAMINE (BENADRYL) 50 MG tablet Take 1 tablet (50 mg total) by mouth every 6 (six) hours as needed for itching or allergies. 30 tablet 1   docusate sodium (COLACE) 100 MG capsule Take 1 capsule (100 mg total) by mouth 2 (two) times daily. (Patient taking differently: Take 100 mg by mouth See admin instructions. Take 100 mg by mouth one to two times daily) 10 capsule 0   EPINEPHrine (EPIPEN 2-PAK) 0.3 mg/0.3 mL IJ SOAJ injection Inject 0.3 mg into the muscle as needed for anaphylaxis. 1 each 3   estrogen-methylTESTOSTERone 0.625-1.25 MG per tablet Take 1 tablet by mouth daily. 90 tablet 3   fexofenadine (ALLEGRA) 180 MG tablet Take 1 tablet (180 mg total) by mouth daily. 100 tablet 3    hydrocortisone 2.5 % cream Apply 1 application topically 2 (two) times daily as needed (for rashes).      meclizine (ANTIVERT) 25 MG tablet Take 1 tablet (25 mg total) by mouth 3 (three) times daily as needed for dizziness. 30 tablet 0   meloxicam (MOBIC) 7.5 MG tablet Take 1 tablet (7.5 mg total) by mouth daily. 14 tablet 0   Multiple Vitamin (MULTIVITAMIN WITH MINERALS) TABS tablet Take 1 tablet by mouth daily.     olopatadine (PATANOL) 0.1 % ophthalmic solution Place 1 drop into both eyes daily as needed for allergies.      ondansetron (ZOFRAN) 4 MG tablet Take 1 tablet (4 mg total) by mouth every 8 (eight) hours as needed for nausea or vomiting. 20 tablet 0   polyethylene glycol (MIRALAX / GLYCOLAX) packet Take 17 g by mouth daily as needed for moderate constipation. (Patient taking differently: Take 17 g by mouth daily as needed for moderate constipation (MIX AND DRINK).) 14 each 0   pseudoephedrine (SUDAFED) 60 MG tablet Take 1 tablet (60 mg total) by mouth every 4 (four) hours as needed (allergic reaction). 60 tablet 1   vitamin C (ASCORBIC ACID) 500 MG tablet Take 500 mg by mouth daily.     No facility-administered medications prior to visit.    ROS: Review  of Systems  Constitutional:  Negative for activity change, appetite change, chills, fatigue and unexpected weight change.  HENT:  Negative for congestion, mouth sores and sinus pressure.   Eyes:  Negative for visual disturbance.  Respiratory:  Negative for cough and chest tightness.   Gastrointestinal:  Negative for abdominal pain and nausea.  Genitourinary:  Negative for difficulty urinating, frequency and vaginal pain.  Musculoskeletal:  Positive for back pain and gait problem.  Skin:  Negative for pallor and rash.  Neurological:  Positive for weakness. Negative for dizziness, tremors, numbness and headaches.  Psychiatric/Behavioral:  Negative for confusion and sleep disturbance.    Objective:  BP 110/78 (BP Location: Left  Arm, Patient Position: Sitting, Cuff Size: Large)   Pulse (!) 57   Temp 97.9 F (36.6 C) (Oral)   Ht 5\' 4"  (1.626 m)   Wt 129 lb (58.5 kg)   SpO2 97%   BMI 22.14 kg/m   BP Readings from Last 3 Encounters:  09/17/21 110/78  09/16/21 120/70  09/15/21 137/74    Wt Readings from Last 3 Encounters:  09/17/21 129 lb (58.5 kg)  06/30/21 129 lb 3.2 oz (58.6 kg)  12/30/20 124 lb (56.2 kg)    Physical Exam Constitutional:      General: She is not in acute distress.    Appearance: Normal appearance. She is well-developed.  HENT:     Head: Normocephalic.     Right Ear: External ear normal.     Left Ear: External ear normal.     Nose: Nose normal.  Eyes:     General:        Right eye: No discharge.        Left eye: No discharge.     Conjunctiva/sclera: Conjunctivae normal.     Pupils: Pupils are equal, round, and reactive to light.  Neck:     Thyroid: No thyromegaly.     Vascular: No JVD.     Trachea: No tracheal deviation.  Cardiovascular:     Rate and Rhythm: Normal rate and regular rhythm.     Heart sounds: Normal heart sounds.  Pulmonary:     Effort: No respiratory distress.     Breath sounds: No stridor. No wheezing.  Abdominal:     General: Bowel sounds are normal. There is no distension.     Palpations: Abdomen is soft. There is no mass.     Tenderness: There is no abdominal tenderness. There is no guarding or rebound.  Musculoskeletal:        General: No tenderness.     Cervical back: Normal range of motion and neck supple. No rigidity.  Lymphadenopathy:     Cervical: No cervical adenopathy.  Skin:    Findings: No erythema or rash.  Neurological:     Mental Status: She is oriented to person, place, and time.     Cranial Nerves: No cranial nerve deficit.     Motor: No abnormal muscle tone.     Coordination: Coordination normal.     Deep Tendon Reflexes: Reflexes normal.  Psychiatric:        Behavior: Behavior normal.        Thought Content: Thought content  normal.        Judgment: Judgment normal.   LS tender w/ROM Str leg elev (+/-) on the R MS OK    A total time of 45 minutes was spent preparing to see the patient, reviewing tests, x-rays, ER reports and other medical records.  Also, obtaining  history and performing comprehensive physical exam.  Additionally, counseling the patient regarding the above listed issues.   Finally, documenting clinical information in the health records, coordination of care, educating the patient re spinal stenosis,radiculopathy, hydration.   Lab Results  Component Value Date   WBC 5.3 09/15/2021   HGB 12.5 09/15/2021   HCT 37.4 09/15/2021   PLT 205 09/15/2021   GLUCOSE 128 (H) 09/15/2021   CHOL 242 (H) 06/30/2021   TRIG 43.0 06/30/2021   HDL 97.60 06/30/2021   LDLDIRECT 134.0 01/25/2012   LDLCALC 135 (H) 06/30/2021   ALT 17 09/15/2021   AST 23 09/15/2021   NA 136 09/15/2021   K 4.1 09/15/2021   CL 104 09/15/2021   CREATININE 1.28 (H) 09/15/2021   BUN 13 09/15/2021   CO2 27 09/15/2021   TSH 2.98 06/30/2021   INR 1.0 03/28/2019    No results found.  Assessment & Plan:   Problem List Items Addressed This Visit     Decreased GFR    5/23 new Hydrate well Repeat GFR       Relevant Orders   Comprehensive metabolic panel   CK   Lumbosacral radiculopathy at L5    New pain, weakness Pt has a moderate B spinal stenosis on MRI at L5: a sudden onset R LS spine severe pain irrad to RLQ, R land L leg pain. Both calves had charlie horses. starting on Sunday and R leg weakness. No numbness. Pt went to ER - she had spine CT and LS MRI - L5 spinal stenosis was found. Sports Med referral Cut back on exercise Tramadol prn  Potential benefits of a short term opioids use as well as potential risks (i.e. addiction risk, apnea etc) and complications (i.e. Somnolence, constipation and others) were explained to the patient and were aknowledged.        Relevant Orders   CK   Spinal stenosis    New  LBP/RLE pain, weakness Pt has a moderate B spinal stenosis on MRI at L5: a sudden onset R LS spine severe pain irrad to RLQ, R land L leg pain. Both calves had charlie horses. starting on Sunday and R leg weakness. No numbness. Pt went to ER - she had spine CT and LS MRI - L5 spinal stenosis was found. Sports Med referral Cut back on exercise Tramadol prn  Potential benefits of a short term opioids use as well as potential risks (i.e. addiction risk, apnea etc) and complications (i.e. Somnolence, constipation and others) were explained to the patient and were aknowledged.       Relevant Orders   Ambulatory referral to Sports Medicine      Meds ordered this encounter  Medications   traMADol (ULTRAM) 50 MG tablet    Sig: Take 1-2 tablets (50-100 mg total) by mouth every 6 (six) hours as needed for up to 5 days for severe pain.    Dispense:  20 tablet    Refill:  1      Follow-up: Return in about 4 weeks (around 10/15/2021) for a follow-up visit.  Walker Kehr, MD

## 2021-09-17 NOTE — Assessment & Plan Note (Addendum)
New pain, weakness Pt has a moderate B spinal stenosis on MRI at L5: a sudden onset R LS spine severe pain irrad to RLQ, R land L leg pain. Both calves had charlie horses. starting on Sunday and R leg weakness. No numbness. Pt went to ER - she had spine CT and LS MRI - L5 spinal stenosis was found. Sports Med referral Cut back on exercise Tramadol prn  Potential benefits of a short term opioids use as well as potential risks (i.e. addiction risk, apnea etc) and complications (i.e. Somnolence, constipation and others) were explained to the patient and were aknowledged.

## 2021-09-17 NOTE — Assessment & Plan Note (Addendum)
5/23 new Hydrate well Repeat GFR

## 2021-09-17 NOTE — Assessment & Plan Note (Addendum)
New LBP/RLE pain, weakness Pt has a moderate B spinal stenosis on MRI at L5: a sudden onset R LS spine severe pain irrad to RLQ, R land L leg pain. Both calves had charlie horses. starting on Sunday and R leg weakness. No numbness. Pt went to ER - she had spine CT and LS MRI - L5 spinal stenosis was found. Sports Med referral Cut back on exercise Tramadol prn  Potential benefits of a short term opioids use as well as potential risks (i.e. addiction risk, apnea etc) and complications (i.e. Somnolence, constipation and others) were explained to the patient and were aknowledged.

## 2021-10-02 ENCOUNTER — Ambulatory Visit: Payer: Medicare Other | Admitting: Family Medicine

## 2021-10-08 ENCOUNTER — Ambulatory Visit: Payer: Medicare Other | Admitting: Family Medicine

## 2021-10-15 ENCOUNTER — Encounter: Payer: Self-pay | Admitting: Internal Medicine

## 2021-10-15 ENCOUNTER — Ambulatory Visit (INDEPENDENT_AMBULATORY_CARE_PROVIDER_SITE_OTHER): Payer: Medicare Other | Admitting: Internal Medicine

## 2021-10-15 VITALS — BP 122/86 | HR 53 | Temp 97.8°F | Ht 64.0 in | Wt 140.0 lb

## 2021-10-15 DIAGNOSIS — M5417 Radiculopathy, lumbosacral region: Secondary | ICD-10-CM | POA: Diagnosis not present

## 2021-10-15 DIAGNOSIS — T148XXA Other injury of unspecified body region, initial encounter: Secondary | ICD-10-CM

## 2021-10-15 DIAGNOSIS — R739 Hyperglycemia, unspecified: Secondary | ICD-10-CM | POA: Diagnosis not present

## 2021-10-15 DIAGNOSIS — R6889 Other general symptoms and signs: Secondary | ICD-10-CM

## 2021-10-15 NOTE — Assessment & Plan Note (Signed)
Mild Check A1c 
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
## 2021-10-15 NOTE — Assessment & Plan Note (Signed)
Palms Use Arnica

## 2021-10-15 NOTE — Assessment & Plan Note (Addendum)
Resolved Tramadol prn if relapsed  Potential benefits of a short term opioids use as well as potential risks (i.e. addiction risk, apnea etc) and complications (i.e. Somnolence, constipation and others) were explained to the patient and were aknowledged.

## 2021-10-15 NOTE — Patient Instructions (Signed)
Blue-Emu cream -- use 2-3 times a day ? ?

## 2021-10-15 NOTE — Assessment & Plan Note (Addendum)
Discussed  Using SPARMs, cooling wrap, misting bottle

## 2021-10-15 NOTE — Progress Notes (Signed)
Subjective:  Patient ID: Karen [name], female    DOB: 1955/11/07  Age: 66 y.o. MRN: [id number]  CC: No chief complaint on file.   HPI Karen [name] presents for sciatica - resolved, heat intolerance, hyperglycemia  Outpatient Medications Prior to Visit  Medication Sig Dispense Refill   Calcium-Vitamin D-Vitamin K (VIACTIV PO) Take 1 tablet by mouth 2 (two) times daily. CHEW     ciprofloxacin (CIPRO) 250 MG tablet Take 1 tablet (250 mg total) by mouth 2 (two) times daily. 10 tablet 0   cyclobenzaprine (FLEXERIL) 10 MG tablet Take 1 tablet (10 mg total) by mouth 2 (two) times daily as needed for muscle spasms. 20 tablet 0   diazepam (VALIUM) 2 MG tablet Take 1 tablet (2 mg total) by mouth every 8 (eight) hours as needed for anxiety. 60 tablet 1   diclofenac sodium (VOLTAREN) 1 % GEL Apply 4 g topically 4 (four) times daily. (Patient taking differently: Apply 4 g topically See admin instructions. Apply 4 grams to the right knee four times daily) 100 g 3   diphenhydrAMINE (BENADRYL) 50 MG tablet Take 1 tablet (50 mg total) by mouth every 6 (six) hours as needed for itching or allergies. 30 tablet 1   docusate sodium (COLACE) 100 MG capsule Take 1 capsule (100 mg total) by mouth 2 (two) times daily. (Patient taking differently: Take 100 mg by mouth See admin instructions. Take 100 mg by mouth one to two times daily) 10 capsule 0   EPINEPHrine (EPIPEN 2-PAK) 0.3 mg/0.3 mL IJ SOAJ injection Inject 0.3 mg into the muscle as needed for anaphylaxis. 1 each 3   estrogen-methylTESTOSTERone 0.625-1.25 MG per tablet Take 1 tablet by mouth daily. 90 tablet 3   fexofenadine (ALLEGRA) 180 MG tablet Take 1 tablet (180 mg total) by mouth daily. 100 tablet 3   hydrocortisone 2.5 % cream Apply 1 application topically 2 (two) times daily as needed (for rashes).      meclizine (ANTIVERT) 25 MG tablet Take 1 tablet (25 mg total) by mouth 3 (three) times daily as needed for dizziness. 30 tablet 0   meloxicam (MOBIC) 7.5  MG tablet Take 1 tablet (7.5 mg total) by mouth daily. 14 tablet 0   Multiple Vitamin (MULTIVITAMIN WITH MINERALS) TABS tablet Take 1 tablet by mouth daily.     olopatadine (PATANOL) 0.1 % ophthalmic solution Place 1 drop into both eyes daily as needed for allergies.      ondansetron (ZOFRAN) 4 MG tablet Take 1 tablet (4 mg total) by mouth every 8 (eight) hours as needed for nausea or vomiting. 20 tablet 0   polyethylene glycol (MIRALAX / GLYCOLAX) packet Take 17 g by mouth daily as needed for moderate constipation. (Patient taking differently: Take 17 g by mouth daily as needed for moderate constipation (MIX AND DRINK).) 14 each 0   pseudoephedrine (SUDAFED) 60 MG tablet Take 1 tablet (60 mg total) by mouth every 4 (four) hours as needed (allergic reaction). 60 tablet 1   vitamin C (ASCORBIC ACID) 500 MG tablet Take 500 mg by mouth daily.     No facility-administered medications prior to visit.    ROS: Review of Systems  Constitutional:  Negative for activity change, appetite change, chills, fatigue and unexpected weight change.  HENT:  Negative for congestion, mouth sores and sinus pressure.   Eyes:  Negative for visual disturbance.  Respiratory:  Negative for cough and chest tightness.   Gastrointestinal:  Negative for abdominal pain and nausea.  Genitourinary:  Negative for difficulty urinating, frequency and vaginal pain.  Musculoskeletal:  Negative for back pain and gait problem.  Skin:  Negative for pallor and rash.  Neurological:  Negative for dizziness, tremors, weakness, numbness and headaches.  Psychiatric/Behavioral:  Negative for confusion and sleep disturbance.     Objective:  BP 122/86 (BP Location: Left Arm, Patient Position: Sitting, Cuff Size: Normal)   Pulse (!) 53   Temp 97.8 F (36.6 C) (Oral)   Ht 5\' 4"  (1.626 m)   Wt 140 lb (63.5 kg)   SpO2 97%   BMI 24.03 kg/m   BP Readings from Last 3 Encounters:  10/15/21 122/86  09/17/21 110/78  09/16/21 120/70     Wt Readings from Last 3 Encounters:  10/15/21 140 lb (63.5 kg)  09/17/21 129 lb (58.5 kg)  06/30/21 129 lb 3.2 oz (58.6 kg)    Physical Exam  Lab Results  Component Value Date   WBC 5.3 09/15/2021   HGB 12.5 09/15/2021   HCT 37.4 09/15/2021   PLT 205 09/15/2021   GLUCOSE 128 (H) 09/15/2021   CHOL 242 (H) 06/30/2021   TRIG 43.0 06/30/2021   HDL 97.60 06/30/2021   LDLDIRECT 134.0 01/25/2012   LDLCALC 135 (H) 06/30/2021   ALT 17 09/15/2021   AST 23 09/15/2021   NA 136 09/15/2021   K 4.1 09/15/2021   CL 104 09/15/2021   CREATININE 1.28 (H) 09/15/2021   BUN 13 09/15/2021   CO2 27 09/15/2021   TSH 2.98 06/30/2021   INR 1.0 03/28/2019    No results found.  Assessment & Plan:   Problem List Items Addressed This Visit     Bruising    Palms Use Arnica      Hyperglycemia - Primary    Mild Check A1c      Relevant Orders   Hemoglobin A1c   Intolerance to heat    Discussed  Using SPARMs, cooling wrap, misting bottle       Lumbosacral radiculopathy at L5    Resolved Tramadol prn if relapsed  Potential benefits of a short term opioids use as well as potential risks (i.e. addiction risk, apnea etc) and complications (i.e. Somnolence, constipation and others) were explained to the patient and were aknowledged.         No orders of the defined types were placed in this encounter.     Follow-up: Return in about 6 months (around 04/16/2022) for a follow-up visit.  04/18/2022, MD

## 2021-10-16 ENCOUNTER — Ambulatory Visit (INDEPENDENT_AMBULATORY_CARE_PROVIDER_SITE_OTHER): Payer: Medicare Other | Admitting: Family Medicine

## 2021-10-16 ENCOUNTER — Encounter: Payer: Self-pay | Admitting: Family Medicine

## 2021-10-16 VITALS — BP 122/84 | HR 53 | Ht 64.0 in

## 2021-10-16 DIAGNOSIS — M5417 Radiculopathy, lumbosacral region: Secondary | ICD-10-CM | POA: Diagnosis not present

## 2021-10-16 DIAGNOSIS — M2550 Pain in unspecified joint: Secondary | ICD-10-CM | POA: Diagnosis not present

## 2021-10-16 DIAGNOSIS — E559 Vitamin D deficiency, unspecified: Secondary | ICD-10-CM

## 2021-10-16 LAB — VITAMIN D 25 HYDROXY (VIT D DEFICIENCY, FRACTURES): VITD: 54.42 ng/mL (ref 30.00–100.00)

## 2021-10-16 LAB — FERRITIN: Ferritin: 29.2 ng/mL (ref 10.0–291.0)

## 2021-10-16 LAB — IBC PANEL
Iron: 75 ug/dL (ref 42–145)
Saturation Ratios: 19.1 % — ABNORMAL LOW (ref 20.0–50.0)
TIBC: 393.4 ug/dL (ref 250.0–450.0)
Transferrin: 281 mg/dL (ref 212.0–360.0)

## 2021-10-16 LAB — CK: Total CK: 100 U/L (ref 7–177)

## 2021-10-16 LAB — SEDIMENTATION RATE: Sed Rate: 10 mm/hr (ref 0–30)

## 2021-10-16 LAB — VITAMIN B12: Vitamin B-12: 577 pg/mL (ref 211–911)

## 2021-10-16 NOTE — Patient Instructions (Addendum)
Labs today 1000 mg of Vit C dialy coQ10 200mg  daily Essenstial amino acids after working work Add 1/2 cup gatorade to cup water in summer Send update in 2 weeks See me in September when you get back from October

## 2021-10-16 NOTE — Assessment & Plan Note (Signed)
Patient does have some spinal stenosis noted on MRI. Patient has had some pain more in the right lower quadrant.  Patient has been taking some tramadol as well for the pain relief.  Patient feels like it is getting better overall.  Concerned that patient continues to have these difficulties and has had some decrease in renal function but this is secondary to the dehydration. Patient does have red hair and could be at increased risk of dehydration.  We discussed different nutritional supplementations that could be beneficial to try to avoid this.  We discussed the importance of staying hydrated otherwise.  Increase activity slowly.  Follow-up with me again in 6 to 8 weeks.  Continuing to have difficulty we will consider further work-up.  Laboratory work-up is ordered today to further evaluate the creatinine kinase.  Should repeat the check of the GFR as well which has already been scheduled by primary care physician.

## 2021-10-18 LAB — CALCIUM, IONIZED: Calcium, Ion: 5.1 mg/dL (ref 4.7–5.5)

## 2021-10-18 LAB — PTH, INTACT AND CALCIUM
Calcium: 9.1 mg/dL (ref 8.6–10.4)
PTH: 39 pg/mL (ref 16–77)

## 2021-10-30 ENCOUNTER — Ambulatory Visit (INDEPENDENT_AMBULATORY_CARE_PROVIDER_SITE_OTHER): Payer: Medicare Other | Admitting: Podiatry

## 2021-10-30 DIAGNOSIS — L603 Nail dystrophy: Secondary | ICD-10-CM

## 2021-10-30 NOTE — Patient Instructions (Signed)
You can start "urea nail gel" on the toenails and also a biotin supplement (vitamin for hair, skin, and nails)

## 2021-10-30 NOTE — Progress Notes (Signed)
error Subjective:   Patient ID: Karen [name], female   DOB: 66 y.o.   MRN: [id number]   HPI 66 year old female presents the office today for concerns of bilateral toenail deformities which has been ongoing for many years.  She has been also previously following up and go back and thickened discolored.  She says been tested 3 times in the last year by dermatology has been negative for fungus every time.  She was using medication for fungus but not improving.  She said that she is very active and she walks at least 2 miles a day.  The right big toenail split previously.  The left big toenail she states is growing up to 45 degree angle.  No swelling redness or drainage.  No open lesions   Review of Systems  All other systems reviewed and are negative.  Past Medical History:  Diagnosis Date   Arthritis    right   Climacteric    on HRT   Complication of anesthesia    Epiglottitis 1959   tracheotomy   Finger pain    pain with pressure on the PIP joint with hemorrhage and swelling. MRI hand inconclusive    History of varicella    Lumbar back pain 1999    initial strain. Intermittent pain since. Re-injured Dec '11. Had PT IN THE PAST.   Plantar fasciitis    Pneumonia 1993   hospitalized   PONV (postoperative nausea and vomiting)     Past Surgical History:  Procedure Laterality Date   ABDOMINAL HYSTERECTOMY     endometriosis   APPENDECTOMY  08/26/2017   ARTHROSCOPIC REPAIR ACL  1995   right-reconstructed with patellar tendon   FOOT SURGERY  1999   left foot-arch ligament release (metatarsal release ?)   LAPAROSCOPIC APPENDECTOMY N/A 08/26/2017   Procedure: APPENDECTOMY LAPAROSCOPIC;  Surgeon: Gaynelle Adu, MD;  Location: [facility] OR;  Service: General;  Laterality: N/A;   MENISCUS REPAIR  1975   right medial/ open   REDUCTION MAMMAPLASTY Bilateral 1976   reduction mammoplasty  1975   TOTAL ABDOMINAL HYSTERECTOMY W/ BILATERAL SALPINGOOPHORECTOMY  '02     Current Outpatient Medications:     Calcium-Vitamin D-Vitamin K (VIACTIV PO), Take 1 tablet by mouth 2 (two) times daily. CHEW, Disp: , Rfl:    ciprofloxacin (CIPRO) 250 MG tablet, Take 1 tablet (250 mg total) by mouth 2 (two) times daily., Disp: 10 tablet, Rfl: 0   cyclobenzaprine (FLEXERIL) 10 MG tablet, Take 1 tablet (10 mg total) by mouth 2 (two) times daily as needed for muscle spasms., Disp: 20 tablet, Rfl: 0   diazepam (VALIUM) 2 MG tablet, Take 1 tablet (2 mg total) by mouth every 8 (eight) hours as needed for anxiety., Disp: 60 tablet, Rfl: 1   diclofenac sodium (VOLTAREN) 1 % GEL, Apply 4 g topically 4 (four) times daily. (Patient taking differently: Apply 4 g topically See admin instructions. Apply 4 grams to the right knee four times daily), Disp: 100 g, Rfl: 3   diphenhydrAMINE (BENADRYL) 50 MG tablet, Take 1 tablet (50 mg total) by mouth every 6 (six) hours as needed for itching or allergies., Disp: 30 tablet, Rfl: 1   docusate sodium (COLACE) 100 MG capsule, Take 1 capsule (100 mg total) by mouth 2 (two) times daily. (Patient taking differently: Take 100 mg by mouth See admin instructions. Take 100 mg by mouth one to two times daily), Disp: 10 capsule, Rfl: 0   EPINEPHrine (EPIPEN 2-PAK) 0.3 mg/0.3 mL IJ SOAJ injection,  Inject 0.3 mg into the muscle as needed for anaphylaxis., Disp: 1 each, Rfl: 3   estrogen-methylTESTOSTERone 0.625-1.25 MG per tablet, Take 1 tablet by mouth daily., Disp: 90 tablet, Rfl: 3   fexofenadine (ALLEGRA) 180 MG tablet, Take 1 tablet (180 mg total) by mouth daily., Disp: 100 tablet, Rfl: 3   hydrocortisone 2.5 % cream, Apply 1 application topically 2 (two) times daily as needed (for rashes). , Disp: , Rfl:    meclizine (ANTIVERT) 25 MG tablet, Take 1 tablet (25 mg total) by mouth 3 (three) times daily as needed for dizziness., Disp: 30 tablet, Rfl: 0   meloxicam (MOBIC) 7.5 MG tablet, Take 1 tablet (7.5 mg total) by mouth daily., Disp: 14 tablet, Rfl: 0   Multiple Vitamin (MULTIVITAMIN WITH  MINERALS) TABS tablet, Take 1 tablet by mouth daily., Disp: , Rfl:    olopatadine (PATANOL) 0.1 % ophthalmic solution, Place 1 drop into both eyes daily as needed for allergies. , Disp: , Rfl:    ondansetron (ZOFRAN) 4 MG tablet, Take 1 tablet (4 mg total) by mouth every 8 (eight) hours as needed for nausea or vomiting., Disp: 20 tablet, Rfl: 0   polyethylene glycol (MIRALAX / GLYCOLAX) packet, Take 17 g by mouth daily as needed for moderate constipation. (Patient taking differently: Take 17 g by mouth daily as needed for moderate constipation (MIX AND DRINK).), Disp: 14 each, Rfl: 0   pseudoephedrine (SUDAFED) 60 MG tablet, Take 1 tablet (60 mg total) by mouth every 4 (four) hours as needed (allergic reaction)., Disp: 60 tablet, Rfl: 1   vitamin C (ASCORBIC ACID) 500 MG tablet, Take 500 mg by mouth daily., Disp: , Rfl:   Allergies  Allergen Reactions   Penicillins Anaphylaxis    Did it involve swelling of the face/tongue/throat, SOB, or low BP? Yes Did it involve sudden or severe rash/hives, skin peeling, or any reaction on the inside of your mouth or nose? Yes Did you need to seek medical attention at a hospital or doctor's office? Yes When did it last happen? "more than 10 years ago"    If all above answers are "NO", may proceed with cephalosporin use.    Hydrocodone Nausea Only   Meclizine     Headache   Singulair [Montelukast Sodium] Other (See Comments)    "Numbness"   Adhesive [Tape] Hives and Rash           Objective:  Physical Exam  General: AAO x3, NAD  Dermatological: Nails are hypertrophic, dystrophic with yellow, brown discoloration.  The there is no edema, erythema or signs of infection.  No significant pain of the nails at this time.  No open lesions.  Vascular: Dorsalis Pedis artery and Posterior Tibial artery pedal pulses are 2/4 bilateral with immedate capillary fill time.There is no pain with calf compression, swelling, warmth, erythema.   Neruologic: Grossly  intact via light touch bilateral.  Musculoskeletal: No gross boney pedal deformities bilateral. No pain, crepitus, or limitation noted with foot and ankle range of motion bilateral. Muscular strength 5/5 in all groups tested bilateral.  Gait: Unassisted, Nonantalgic.       Assessment:   Onychodystrophy     Plan:  -Treatment options discussed including all alternatives, risks, and complications. -Etiology of symptoms were discussed -Unfortunately think the nails are more from damage.  She has had them tested 3 times previously.  Recommended urea nail gel as well as biotin supplements.  Sharp debridement nails x2 without any complications or bleeding as a courtesy.  Trula Slade DPM

## 2021-11-13 ENCOUNTER — Other Ambulatory Visit: Payer: Self-pay | Admitting: Internal Medicine

## 2021-11-13 DIAGNOSIS — Z1231 Encounter for screening mammogram for malignant neoplasm of breast: Secondary | ICD-10-CM

## 2021-12-15 ENCOUNTER — Other Ambulatory Visit: Payer: Self-pay

## 2021-12-15 ENCOUNTER — Encounter (HOSPITAL_COMMUNITY): Payer: Self-pay | Admitting: Emergency Medicine

## 2021-12-15 ENCOUNTER — Encounter: Payer: Self-pay | Admitting: Family Medicine

## 2021-12-15 ENCOUNTER — Emergency Department (HOSPITAL_COMMUNITY): Payer: Medicare Other

## 2021-12-15 ENCOUNTER — Emergency Department (HOSPITAL_COMMUNITY)
Admission: EM | Admit: 2021-12-15 | Discharge: 2021-12-15 | Disposition: A | Discharge status: Home / Self Care | Payer: Medicare Other | Attending: Emergency Medicine | Admitting: Emergency Medicine

## 2021-12-15 ENCOUNTER — Ambulatory Visit (INDEPENDENT_AMBULATORY_CARE_PROVIDER_SITE_OTHER): Payer: Medicare Other | Admitting: Family Medicine

## 2021-12-15 VITALS — BP 112/80 | HR 57 | Temp 98.6°F | Wt 138.0 lb

## 2021-12-15 DIAGNOSIS — T445X1A Poisoning by predominantly beta-adrenoreceptor agonists, accidental (unintentional), initial encounter: Secondary | ICD-10-CM | POA: Diagnosis present

## 2021-12-15 DIAGNOSIS — I998 Other disorder of circulatory system: Secondary | ICD-10-CM | POA: Diagnosis not present

## 2021-12-15 NOTE — ED Notes (Signed)
Spoke with Karen [name] at poison control and she said continue to soak in warm water for an additional hour while also massaging her thumb. If circulation returns then she is ok to go home.
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
## 2021-12-15 NOTE — Patient Instructions (Signed)
Please go to the [facility] emergency department and tell the triage nurse that I am sending you due to concern for ischemic finger related to accidental injection of epi-pen into the tip

## 2021-12-15 NOTE — ED Provider Notes (Signed)
[facility] Note   CSN: [id number] Arrival date & time: 12/15/21  1523     History  Chief Complaint  Patient presents with   accidental epi injection in the thumb    Karen [name] is a 66 y.o. female.  Patient presents to the emergency department for evaluation of right thumb pain.  Symptoms started at around 10 AM today when she was reaching in a drawer and accidentally injected her right thumb with an EpiPen.  Prior to ED evaluation, EMS was called and they evaluated the patient.  She states that she was feeling very jittery and anxious.  She noted color change of the thumb, numbness and tingling, and presented to urgent care.  She was then referred to the emergency department.  No calls were made to poison control prior to ED arrival.  No treatments prior to arrival.  Patient reports pain in the thumb with a white/purple coloring to the nailbed.  She thinks that the needle may have entered the radial aspect of the thumb and exited through the finger pad.      Home Medications Prior to Admission medications   Medication Sig Start Date End Date Taking? Authorizing Provider  Calcium-Vitamin D-Vitamin K (VIACTIV PO) Take 1 tablet by mouth 2 (two) times daily. CHEW    [provider]  ciprofloxacin (CIPRO) 250 MG tablet Take 1 tablet (250 mg total) by mouth 2 (two) times daily. 06/30/21   Plotnikov, Georgina Quint, MD  cyclobenzaprine (FLEXERIL) 10 MG tablet Take 1 tablet (10 mg total) by mouth 2 (two) times daily as needed for muscle spasms. 09/15/21   Derwood Kaplan, MD  diazepam (VALIUM) 2 MG tablet Take 1 tablet (2 mg total) by mouth every 8 (eight) hours as needed for anxiety. 06/30/21   Plotnikov, Georgina Quint, MD  diclofenac sodium (VOLTAREN) 1 % GEL Apply 4 g topically 4 (four) times daily. Patient taking differently: Apply 4 g topically See admin instructions. Apply 4 grams to the right knee four times daily 12/28/18   Plotnikov, Georgina Quint,  MD  diphenhydrAMINE (BENADRYL) 50 MG tablet Take 1 tablet (50 mg total) by mouth every 6 (six) hours as needed for itching or allergies. 05/28/17   Plotnikov, Georgina Quint, MD  docusate sodium (COLACE) 100 MG capsule Take 1 capsule (100 mg total) by mouth 2 (two) times daily. Patient taking differently: Take 100 mg by mouth See admin instructions. Take 100 mg by mouth one to two times daily 09/01/17   Meuth, Brooke A, PA-C  EPINEPHrine (EPIPEN 2-PAK) 0.3 mg/0.3 mL IJ SOAJ injection Inject 0.3 mg into the muscle as needed for anaphylaxis. 06/30/21   Plotnikov, Georgina Quint, MD  estrogen-methylTESTOSTERone 0.625-1.25 MG per tablet Take 1 tablet by mouth daily. 02/02/14   Margaree Mackintosh, MD  fexofenadine (ALLEGRA) 180 MG tablet Take 1 tablet (180 mg total) by mouth daily. 05/28/17   Plotnikov, Georgina Quint, MD  hydrocortisone 2.5 % cream Apply 1 application topically 2 (two) times daily as needed (for rashes).     [provider]  meclizine (ANTIVERT) 25 MG tablet Take 1 tablet (25 mg total) by mouth 3 (three) times daily as needed for dizziness. 03/29/19   Horton, Mayer Masker, MD  meloxicam (MOBIC) 7.5 MG tablet Take 1 tablet (7.5 mg total) by mouth daily. 09/15/21   Derwood Kaplan, MD  Multiple Vitamin (MULTIVITAMIN WITH MINERALS) TABS tablet Take 1 tablet by mouth daily.    [provider]  olopatadine (PATANOL)  0.1 % ophthalmic solution Place 1 drop into both eyes daily as needed for allergies.     [provider]  ondansetron (ZOFRAN) 4 MG tablet Take 1 tablet (4 mg total) by mouth every 8 (eight) hours as needed for nausea or vomiting. 01/10/20   Plotnikov, Georgina Quint, MD  polyethylene glycol (MIRALAX / GLYCOLAX) packet Take 17 g by mouth daily as needed for moderate constipation. Patient taking differently: Take 17 g by mouth daily as needed for moderate constipation (MIX AND DRINK). 09/01/17   Meuth, Lina Sar, PA-C  pseudoephedrine (SUDAFED) 60 MG tablet Take 1 tablet (60 mg total) by  mouth every 4 (four) hours as needed (allergic reaction). 06/01/17   Plotnikov, Georgina Quint, MD  vitamin C (ASCORBIC ACID) 500 MG tablet Take 500 mg by mouth daily.    [provider]      Allergies    Penicillins, Hydrocodone, Meclizine, Singulair [montelukast sodium], and Adhesive [tape]    Review of Systems   Review of Systems  Physical Exam Updated Vital Signs BP (!) 160/86   Pulse 67   Temp (!) 97.5 F (36.4 C) (Oral)   Resp (!) 22   Ht 5\' 4"  (1.626 m)   Wt 61.2 kg   SpO2 98%   BMI 23.17 kg/m   Physical Exam Vitals and nursing note reviewed.  Constitutional:      Appearance: She is well-developed.  HENT:     Head: Normocephalic and atraumatic.  Eyes:     Conjunctiva/sclera: Conjunctivae normal.  Pulmonary:     Effort: No respiratory distress.  Musculoskeletal:     Cervical back: Normal range of motion and neck supple.     Comments: Right thumb: Decreased capillary refill, mildly cyanotic nailbed.  Tender to palpation.  There is a puncture wound in the center of the pad of the thumb.  No nail injury.  Patient is able to flex and extend the digit.  Skin:    General: Skin is warm and dry.  Neurological:     Mental Status: She is alert.  Psychiatric:        Mood and Affect: Mood is anxious.    ED Results / Procedures / Treatments   Labs (all labs ordered are listed, but only abnormal results are displayed) Labs Reviewed - No data to display  EKG None  Radiology DG Finger Thumb Right  Result Date: 12/15/2021 CLINICAL DATA:  Accidental injection of epinephrine EXAM: RIGHT THUMB 2+V COMPARISON:  None Available. FINDINGS: No fracture or dislocation is seen. Degenerative changes are noted with bony spurs in first metacarpophalangeal joint and interphalangeal joint of right thumb. There are no opaque foreign bodies. IMPRESSION: No fracture or dislocation is seen.  Degenerative changes are noted. Electronically Signed   By: 12/17/2021 M.D.   On:  12/15/2021 16:37    Procedures Procedures    Medications Ordered in ED Medications - No data to display  ED Course/ Medical Decision Making/ A&P    Patient seen and examined. History obtained directly from patient.   4:00 PM Called poison control for recommendations.  They recommend warm water soaking with gentle massage.  They do not routinelyrecommend any topical vasodilators.  States that, now 6 hours out, should not notice worsening.    Labs/EKG: None ordered  Imaging: Ordered x-ray of the thumb to evaluate for bony injury from injection needle.  Medications/Fluids: I offered oral pain medication, patient declines.  Most recent vital signs reviewed and are as follows: BP 12/17/2021)  160/86   Pulse 67   Temp (!) 97.5 F (36.4 C) (Oral)   Resp (!) 22   Ht 5\' 4"  (1.626 m)   Wt 61.2 kg   SpO2 98%   BMI 23.17 kg/m   Initial impression: Right thumb, accidental auto-injection of EpiPen.  Will obtain x-ray and continue to evaluate.  4:33 PM Pt reassessed. Updated on discussion with poison control. Exam appears stable. Reviewed up-to-date and did internet search to look for any other potential treatments. Phentoamine has been suggested as a treatment but only case studies seem to exist as evidence.   X-ray personally reviewed and interpreted, agree no fracture.  4:53 PM Did discuss with Dr. Francia Greaves. Agrees with treatment to this point.   5:20 PM Spoke with Dr. Marcelino Scot earlier regarding this as well. Agrees to current treatment.  No further recommendations.  His opinion is that symptoms should be improved in the morning.  He is happy to follow-up with the patient if she has any residual effects tomorrow.  States he is in clinic on Wednesday.  Patient and family updated.  Plan to monitor and reassess at 6 PM.  Likely DC home if stable.  Reexam, finger appears to be stable, some slight improvement in color.   6:13 PM Reassessment performed.  Patient's thumb is objectively improving.   The pad of the thumb now has a more normal color to it.  She continues to have blanched appearance of the dorsal aspect of the thumb and numbness in the tip of the thumb.  Patient has continued to soak the finger in warm water and massage gently.  Most current vital signs reviewed and are as follows: BP (!) 116/97   Pulse (!) 58   Temp 98 F (36.7 C) (Oral)   Resp 16   Ht 5\' 4"  (1.626 m)   Wt 61.2 kg   SpO2 98%   BMI 23.17 kg/m   Plan: At this point will discharge to home.  Patient is able to continue soaks at home.  She seems like she would be reassured by following up with hand surgeon.  ED return instructions discussed: Return with worsening pain or developing signs of necrosis.  These were discussed with patient and husband at bedside.  Follow-up instructions discussed: Patient encouraged to follow-up with their PCP or orthopedic referral as needed.                           Medical Decision Making Amount and/or Complexity of Data Reviewed Radiology: ordered.   Patient with accidental injection of epinephrine from an EpiPen into her thumb.  Exam and symptoms consistent with expected ischemia.  Fortunately, this has been improving during ED stay.  Did discuss with orthopedic hand surgery and poison control.  Comfortable with discharge given degree of improvement.  Follow-up obtained as needed.  The patient's vital signs, pertinent lab work and imaging were reviewed and interpreted as discussed in the ED course. Hospitalization was considered for further testing, treatments, or serial exams/observation. However as patient is well-appearing, has a stable exam, and reassuring studies today, I do not feel that they warrant admission at this time. This plan was discussed with the patient who verbalizes agreement and comfort with this plan and seems reliable and able to return to the Emergency Department with worsening or changing symptoms.          Final Clinical Impression(s) /  ED Diagnoses Final diagnoses:  Accidental injection of epinephrine,  initial encounter    Rx / DC Orders ED Discharge Orders     None         Carlisle Cater, Hershal Coria 12/15/21 1815    Valarie Merino, MD 12/15/21 [phone]

## 2021-12-15 NOTE — ED Triage Notes (Signed)
Patient was searching in a drawer and stuck herself with an epi pen into the right rhumb.  Patient went to an UC and was instructed to come to the ED.  Patient has slight swelling and blue color in her right thumb.

## 2021-12-15 NOTE — Discharge Instructions (Signed)
The x-ray of your finger was negative for broken bones or fracture.  Please keep the wound of the finger clean.  We anticipate that the appearance of the thumb will improve during the course of the night, however if you have any color changes or persistent numbness, please follow-up with Dr. Carola Frost.   If you notice worsening pain, dark black coloring of the skin, or other problems, he is return to the emergency department.

## 2021-12-15 NOTE — ED Notes (Signed)
Triage completed by Aloha Gell, RN and not Bertis Ruddy, RN.
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
## 2021-12-18 ENCOUNTER — Telehealth: Payer: Self-pay

## 2021-12-18 NOTE — Telephone Encounter (Signed)
Pt is requesting a call back in regards to the visit on 8/28 about her thumb.  Please advise

## 2021-12-24 ENCOUNTER — Ambulatory Visit (INDEPENDENT_AMBULATORY_CARE_PROVIDER_SITE_OTHER): Payer: Medicare Other | Admitting: Family Medicine

## 2021-12-24 ENCOUNTER — Encounter: Payer: Self-pay | Admitting: Family Medicine

## 2021-12-24 DIAGNOSIS — M48061 Spinal stenosis, lumbar region without neurogenic claudication: Secondary | ICD-10-CM | POA: Diagnosis not present

## 2021-12-24 NOTE — Patient Instructions (Signed)
See me in 2-3 months  Send me a message in couple months If doing better can cancel 1-1.5 g of amino acids daily Look at [employer] Take a vitamin and a half of multi vitamin

## 2021-12-24 NOTE — Telephone Encounter (Signed)
Pt has f/u  appt w/Dr. Posey Rea 12/29/21.Marland Kitchenlmb

## 2021-12-24 NOTE — Assessment & Plan Note (Signed)
Stable but no significant improvement.  Patient is avoiding certain triggers such as tennis because of this.  Discussed with patient about icing regimen and home exercises.  We discussed over-the-counter supplementation including increasing her vitamin D supplementation.  We discussed the possibility as well over the spinal stenosis playing a role.  Patient wants to continue with conservative area and try to avoid certain medications if possible.  Otherwise may need to consider further possible things such as injections as well.  Hopefully the patient will make some improvement with the relatively easy changes.  Total time discussed with patient 33 minutes

## 2021-12-27 ENCOUNTER — Encounter: Payer: Self-pay | Admitting: Family Medicine

## 2021-12-29 ENCOUNTER — Encounter: Payer: Self-pay | Admitting: Internal Medicine

## 2021-12-29 ENCOUNTER — Ambulatory Visit (INDEPENDENT_AMBULATORY_CARE_PROVIDER_SITE_OTHER): Payer: Medicare Other | Admitting: Internal Medicine

## 2021-12-29 VITALS — BP 120/70 | HR 48 | Temp 97.8°F | Ht 64.0 in | Wt 132.6 lb

## 2021-12-29 DIAGNOSIS — Z23 Encounter for immunization: Secondary | ICD-10-CM

## 2021-12-29 DIAGNOSIS — F419 Anxiety disorder, unspecified: Secondary | ICD-10-CM

## 2021-12-29 DIAGNOSIS — T445X1S Poisoning by predominantly beta-adrenoreceptor agonists, accidental (unintentional), sequela: Secondary | ICD-10-CM | POA: Diagnosis not present

## 2021-12-29 DIAGNOSIS — M48061 Spinal stenosis, lumbar region without neurogenic claudication: Secondary | ICD-10-CM | POA: Diagnosis not present

## 2021-12-29 DIAGNOSIS — R7989 Other specified abnormal findings of blood chemistry: Secondary | ICD-10-CM | POA: Diagnosis not present

## 2021-12-29 DIAGNOSIS — T445X1A Poisoning by predominantly beta-adrenoreceptor agonists, accidental (unintentional), initial encounter: Secondary | ICD-10-CM | POA: Insufficient documentation

## 2021-12-29 NOTE — Assessment & Plan Note (Addendum)
Abd CT was nl 2019 Monitor labs

## 2021-12-29 NOTE — Progress Notes (Signed)
Subjective:  Patient ID: Karen [name], female    DOB: 09/22/1955  Age: 66 y.o. MRN: [id number]  CC: Follow-up (6 month f/u)   HPI Karen [name] presents for recent ER visit F/u on LBP, sports related dehydration  Outpatient Medications Prior to Visit  Medication Sig Dispense Refill   Calcium-Vitamin D-Vitamin K (VIACTIV PO) Take 1 tablet by mouth 2 (two) times daily. CHEW     cyclobenzaprine (FLEXERIL) 10 MG tablet Take 1 tablet (10 mg total) by mouth 2 (two) times daily as needed for muscle spasms. 20 tablet 0   diazepam (VALIUM) 2 MG tablet Take 1 tablet (2 mg total) by mouth every 8 (eight) hours as needed for anxiety. 60 tablet 1   diclofenac sodium (VOLTAREN) 1 % GEL Apply 4 g topically 4 (four) times daily. (Patient taking differently: Apply 4 g topically See admin instructions. Apply 4 grams to the right knee four times daily) 100 g 3   diphenhydrAMINE (BENADRYL) 50 MG tablet Take 1 tablet (50 mg total) by mouth every 6 (six) hours as needed for itching or allergies. 30 tablet 1   docusate sodium (COLACE) 100 MG capsule Take 1 capsule (100 mg total) by mouth 2 (two) times daily. (Patient taking differently: Take 100 mg by mouth See admin instructions. Take 100 mg by mouth one to two times daily) 10 capsule 0   EPINEPHrine (EPIPEN 2-PAK) 0.3 mg/0.3 mL IJ SOAJ injection Inject 0.3 mg into the muscle as needed for anaphylaxis. 1 each 3   estrogen-methylTESTOSTERone 0.625-1.25 MG per tablet Take 1 tablet by mouth daily. 90 tablet 3   fexofenadine (ALLEGRA) 180 MG tablet Take 1 tablet (180 mg total) by mouth daily. 100 tablet 3   hydrocortisone 2.5 % cream Apply 1 application topically 2 (two) times daily as needed (for rashes).      meclizine (ANTIVERT) 25 MG tablet Take 1 tablet (25 mg total) by mouth 3 (three) times daily as needed for dizziness. 30 tablet 0   meloxicam (MOBIC) 7.5 MG tablet Take 1 tablet (7.5 mg total) by mouth daily. 14 tablet 0   Multiple Vitamin (MULTIVITAMIN WITH  MINERALS) TABS tablet Take 1 tablet by mouth daily.     olopatadine (PATANOL) 0.1 % ophthalmic solution Place 1 drop into both eyes daily as needed for allergies.      ondansetron (ZOFRAN) 4 MG tablet Take 1 tablet (4 mg total) by mouth every 8 (eight) hours as needed for nausea or vomiting. 20 tablet 0   polyethylene glycol (MIRALAX / GLYCOLAX) packet Take 17 g by mouth daily as needed for moderate constipation. (Patient taking differently: Take 17 g by mouth daily as needed for moderate constipation (MIX AND DRINK).) 14 each 0   pseudoephedrine (SUDAFED) 60 MG tablet Take 1 tablet (60 mg total) by mouth every 4 (four) hours as needed (allergic reaction). 60 tablet 1   vitamin C (ASCORBIC ACID) 500 MG tablet Take 500 mg by mouth daily.     ciprofloxacin (CIPRO) 250 MG tablet Take 1 tablet (250 mg total) by mouth 2 (two) times daily. (Patient not taking: Reported on 12/29/2021) 10 tablet 0   No facility-administered medications prior to visit.    ROS: Review of Systems  Constitutional:  Negative for activity change, appetite change, chills, fatigue and unexpected weight change.  HENT:  Negative for congestion, mouth sores and sinus pressure.   Eyes:  Negative for visual disturbance.  Respiratory:  Negative for cough and chest tightness.   Cardiovascular:  Negative for palpitations.  Gastrointestinal:  Negative for abdominal pain and nausea.  Genitourinary:  Negative for difficulty urinating, frequency and vaginal pain.  Musculoskeletal:  Positive for back pain. Negative for gait problem.  Skin:  Negative for pallor and rash.  Neurological:  Negative for dizziness, tremors, weakness, numbness and headaches.  Psychiatric/Behavioral:  Negative for confusion, decreased concentration, dysphoric mood and sleep disturbance.     Objective:  BP 120/70 (BP Location: Left Arm)   Pulse (!) 48   Temp 97.8 F (36.6 C) (Oral)   Ht 5\' 4"  (1.626 m)   Wt 132 lb 9.6 oz (60.1 kg)   SpO2 97%   BMI 22.76  kg/m   BP Readings from Last 3 Encounters:  12/29/21 120/70  12/24/21 110/80  12/15/21 111/78    Wt Readings from Last 3 Encounters:  12/29/21 132 lb 9.6 oz (60.1 kg)  12/15/21 135 lb (61.2 kg)  12/15/21 138 lb (62.6 kg)    Physical Exam Constitutional:      General: She is not in acute distress.    Appearance: She is well-developed.  HENT:     Head: Normocephalic.     Right Ear: External ear normal.     Left Ear: External ear normal.     Nose: Nose normal.  Eyes:     General:        Right eye: No discharge.        Left eye: No discharge.     Conjunctiva/sclera: Conjunctivae normal.     Pupils: Pupils are equal, round, and reactive to light.  Neck:     Thyroid: No thyromegaly.     Vascular: No JVD.     Trachea: No tracheal deviation.  Cardiovascular:     Rate and Rhythm: Normal rate and regular rhythm.     Heart sounds: Normal heart sounds.  Pulmonary:     Effort: No respiratory distress.     Breath sounds: No stridor. No wheezing.  Abdominal:     General: Bowel sounds are normal. There is no distension.     Palpations: Abdomen is soft. There is no mass.     Tenderness: There is no abdominal tenderness. There is no guarding or rebound.  Musculoskeletal:        General: No tenderness.     Cervical back: Normal range of motion and neck supple. No rigidity.  Lymphadenopathy:     Cervical: No cervical adenopathy.  Skin:    Findings: No erythema or rash.  Neurological:     Cranial Nerves: No cranial nerve deficit.     Motor: No abnormal muscle tone.     Coordination: Coordination normal.     Deep Tendon Reflexes: Reflexes normal.  Psychiatric:        Behavior: Behavior normal.        Thought Content: Thought content normal.        Judgment: Judgment normal.   B thumbs WNL  Lab Results  Component Value Date   WBC 5.3 09/15/2021   HGB 12.5 09/15/2021   HCT 37.4 09/15/2021   PLT 205 09/15/2021   GLUCOSE 128 (H) 09/15/2021   CHOL 242 (H) 06/30/2021   TRIG  43.0 06/30/2021   HDL 97.60 06/30/2021   LDLDIRECT 134.0 01/25/2012   LDLCALC 135 (H) 06/30/2021   ALT 17 09/15/2021   AST 23 09/15/2021   NA 136 09/15/2021   K 4.1 09/15/2021   CL 104 09/15/2021   CREATININE 1.28 (H) 09/15/2021   BUN 13 09/15/2021  CO2 27 09/15/2021   TSH 2.98 06/30/2021   INR 1.0 03/28/2019    DG Finger Thumb Right  Result Date: 12/15/2021 CLINICAL DATA:  Accidental injection of epinephrine EXAM: RIGHT THUMB 2+V COMPARISON:  None Available. FINDINGS: No fracture or dislocation is seen. Degenerative changes are noted with bony spurs in first metacarpophalangeal joint and interphalangeal joint of right thumb. There are no opaque foreign bodies. IMPRESSION: No fracture or dislocation is seen.  Degenerative changes are noted. Electronically Signed   By: Ernie Avena M.D.   On: 12/15/2021 16:37    Assessment & Plan:   Problem List Items Addressed This Visit     Accidental injection of epinephrine    Recent - 12/15/21 she was reaching in a drawer and accidentally injected her right thumb with an EpiPen.  Prior to ED evaluation, EMS was called and they evaluated the patient.  She states that she was feeling very jittery and anxious.  She noted color change of the thumb, numbness and tingling, and presented to urgent care.  She was then referred to the emergency department.  No calls were made to poison control prior to ED arrival.  No treatments prior to arrival.  Patient reports pain in the thumb with a white/purple coloring to the nailbed.  She thinks that the needle may have entered the radial aspect of the thumb and exited through the finger pad.  Sx's resolved in 48 hrs. Pt saw Dr Carola Frost tDAP is due in 2023      Anxiety   Elevated LFTs    Abd CT was nl 2019 Monitor labs      Spinal stenosis    In PT now      Other Visit Diagnoses     Needs flu shot    -  Primary   Relevant Orders   Flu Vaccine QUAD High Dose(Fluad) (Completed)         No  orders of the defined types were placed in this encounter.     Follow-up: Return in about 6 months (around 06/29/2022) for Wellness Exam.  Sonda Primes, MD

## 2021-12-29 NOTE — Assessment & Plan Note (Addendum)
Recent - 12/15/21 she was reaching in a drawer and accidentally injected her right thumb with an EpiPen.  Prior to ED evaluation, EMS was called and they evaluated the patient.  She states that she was feeling very jittery and anxious.  She noted color change of the thumb, numbness and tingling, and presented to urgent care.  She was then referred to the emergency department.  No calls were made to poison control prior to ED arrival.  No treatments prior to arrival.  Patient reports pain in the thumb with a white/purple coloring to the nailbed.  She thinks that the needle may have entered the radial aspect of the thumb and exited through the finger pad.  Sx's resolved in 48 hrs. Pt saw Dr Carola Frost tDAP is due in 2023

## 2021-12-29 NOTE — Assessment & Plan Note (Signed)
In PT now 

## 2022-01-02 ENCOUNTER — Ambulatory Visit
Admission: RE | Admit: 2022-01-02 | Discharge: 2022-01-02 | Disposition: A | Discharge status: Home / Self Care | Payer: Medicare Other | Source: Ambulatory Visit | Attending: Internal Medicine | Admitting: Internal Medicine

## 2022-01-02 DIAGNOSIS — Z1231 Encounter for screening mammogram for malignant neoplasm of breast: Secondary | ICD-10-CM

## 2022-01-29 ENCOUNTER — Ambulatory Visit (INDEPENDENT_AMBULATORY_CARE_PROVIDER_SITE_OTHER): Payer: Medicare Other | Admitting: Podiatry

## 2022-01-29 DIAGNOSIS — B351 Tinea unguium: Secondary | ICD-10-CM | POA: Diagnosis not present

## 2022-01-29 DIAGNOSIS — M79675 Pain in left toe(s): Secondary | ICD-10-CM

## 2022-01-29 DIAGNOSIS — M79674 Pain in right toe(s): Secondary | ICD-10-CM

## 2022-01-29 NOTE — Progress Notes (Signed)
Subjective: Chief Complaint  Patient presents with   Nail Problem    Routine foot care, Nail fungus, nail trim     66 year old female with the above concerns.  She has difficulty trimming her nails as they are thickened elongated.  She has been a restart topical medications that were prescribed by her dermatologist.    Objective: AAO x3, NAD DP/PT pulses palpable bilaterally, CRT less than 3 seconds Nails continue be hypertrophic, dystrophic with yellow, brown discoloration.  The nails do get uncomfortable.  Elongated causing irritation with shoes.  No edema, erythema. No pain with calf compression, swelling, warmth, erythema  Assessment: 66 year old female with symptomatic onychomycosis, onychodystrophy  Plan: -All treatment options discussed with the patient including all alternatives, risks, complications.  -We will check a debrided x10 without any complications or bleeding.  Continue topical medication was prescribed to her rheumatologist.  Also discussed urea.Nails have been tested previously which were negative for fungus but clinically appears to be fungal. Nails have been tested previously which were negative for fungus but clinically appears to be fungal. -Patient encouraged to call the office with any questions, concerns, change in symptoms.   Trula Slade DPM

## 2022-02-25 ENCOUNTER — Ambulatory Visit: Payer: Medicare Other | Admitting: Family Medicine

## 2022-04-02 ENCOUNTER — Ambulatory Visit (INDEPENDENT_AMBULATORY_CARE_PROVIDER_SITE_OTHER): Payer: Medicare Other | Admitting: Podiatry

## 2022-04-02 DIAGNOSIS — M79674 Pain in right toe(s): Secondary | ICD-10-CM | POA: Diagnosis not present

## 2022-04-02 DIAGNOSIS — B351 Tinea unguium: Secondary | ICD-10-CM

## 2022-04-02 DIAGNOSIS — M79675 Pain in left toe(s): Secondary | ICD-10-CM | POA: Diagnosis not present

## 2022-04-02 NOTE — Progress Notes (Signed)
Subjective: Chief Complaint  Patient presents with   Nail Problem    Patient came in today for routine foot care and Nail fungus     66 year old female with the above concerns.  Her nails are still thickened discolored.  She does have a compound cream from dermatologist that she has not been using.  Nails cause discomfort.  Elongated.  No swelling redness or drainage.  Objective: AAO x3, NAD DP/PT pulses palpable bilaterally, CRT less than 3 seconds Nails continue be hypertrophic, dystrophic with yellow, brown discoloration.  The nails do get uncomfortable as they get elongated causing irritation with shoes.  No edema, erythema. No pain with calf compression, swelling, warmth, erythema  Assessment: 66 year old female with symptomatic onychomycosis, onychodystrophy  Plan: -All treatment options discussed with the patient including all alternatives, risks, complications.  -We will check a debrided x10 without any complications or bleeding.  She still has the compound cream from her dermatologist and I recommended going back to using that as it includes urea.  Discussed urea nail gel as well.   -Patient encouraged to call the office with any questions, concerns, change in symptoms.   Vivi Barrack DPM

## 2022-05-25 ENCOUNTER — Ambulatory Visit (INDEPENDENT_AMBULATORY_CARE_PROVIDER_SITE_OTHER): Payer: Medicare Other | Admitting: Podiatry

## 2022-05-25 DIAGNOSIS — M79674 Pain in right toe(s): Secondary | ICD-10-CM | POA: Diagnosis not present

## 2022-05-25 DIAGNOSIS — B351 Tinea unguium: Secondary | ICD-10-CM

## 2022-05-25 DIAGNOSIS — M79675 Pain in left toe(s): Secondary | ICD-10-CM | POA: Diagnosis not present

## 2022-05-25 NOTE — Progress Notes (Signed)
Subjective: Chief Complaint  Patient presents with   Nail Problem    Thick painful toenails, 6 week  follow up    67 year old female with the above concerns.  Her nails are still thickened discolored. She states she has started to see improvement. She is still using the compound cream that was prescribed by her dermatologist.  Nails cause discomfort at times when they get thick and elongated.  No swelling redness or drainage.  Objective: AAO x3, NAD DP/PT pulses palpable bilaterally, CRT less than 3 seconds Nails continue be hypertrophic, dystrophic with yellow, brown discoloration. There is some minimal clearing along the nails.  No edema, erythema. No pain with calf compression, swelling, warmth, erythema  Assessment: 67 year old female with symptomatic onychomycosis, onychodystrophy  Plan: -All treatment options discussed with the patient including all alternatives, risks, complications.  -We will check a debrided x10 without any complications or bleeding.  Continue the compound cream from her dermatologist and I recommended going back to using that as it includes urea.  Discussed urea nail gel as well.   -Patient encouraged to call the office with any questions, concerns, change in symptoms.   Trula Slade DPM

## 2022-07-02 ENCOUNTER — Ambulatory Visit (INDEPENDENT_AMBULATORY_CARE_PROVIDER_SITE_OTHER): Payer: Medicare Other | Admitting: Internal Medicine

## 2022-07-02 ENCOUNTER — Encounter: Payer: Self-pay | Admitting: Internal Medicine

## 2022-07-02 VITALS — BP 120/72 | HR 56 | Temp 98.2°F | Ht 64.0 in | Wt 129.0 lb

## 2022-07-02 DIAGNOSIS — L853 Xerosis cutis: Secondary | ICD-10-CM | POA: Diagnosis not present

## 2022-07-02 DIAGNOSIS — R739 Hyperglycemia, unspecified: Secondary | ICD-10-CM | POA: Diagnosis not present

## 2022-07-02 DIAGNOSIS — J302 Other seasonal allergic rhinitis: Secondary | ICD-10-CM

## 2022-07-02 DIAGNOSIS — R944 Abnormal results of kidney function studies: Secondary | ICD-10-CM | POA: Diagnosis not present

## 2022-07-02 DIAGNOSIS — Z0000 Encounter for general adult medical examination without abnormal findings: Secondary | ICD-10-CM | POA: Diagnosis not present

## 2022-07-02 DIAGNOSIS — M62838 Other muscle spasm: Secondary | ICD-10-CM

## 2022-07-02 LAB — CBC WITH DIFFERENTIAL/PLATELET
Basophils Absolute: 0.1 10*3/uL (ref 0.0–0.1)
Basophils Relative: 1.3 % (ref 0.0–3.0)
Eosinophils Absolute: 0.1 10*3/uL (ref 0.0–0.7)
Eosinophils Relative: 2.4 % (ref 0.0–5.0)
HCT: 40.2 % (ref 36.0–46.0)
Hemoglobin: 13.7 g/dL (ref 12.0–15.0)
Lymphocytes Relative: 43.2 % (ref 12.0–46.0)
Lymphs Abs: 1.9 10*3/uL (ref 0.7–4.0)
MCHC: 34.1 g/dL (ref 30.0–36.0)
MCV: 94.5 fl (ref 78.0–100.0)
Monocytes Absolute: 0.4 10*3/uL (ref 0.1–1.0)
Monocytes Relative: 8.9 % (ref 3.0–12.0)
Neutro Abs: 1.9 10*3/uL (ref 1.4–7.7)
Neutrophils Relative %: 44.2 % (ref 43.0–77.0)
Platelets: 221 10*3/uL (ref 150.0–400.0)
RBC: 4.25 Mil/uL (ref 3.87–5.11)
RDW: 13.8 % (ref 11.5–15.5)
WBC: 4.3 10*3/uL (ref 4.0–10.5)

## 2022-07-02 LAB — COMPREHENSIVE METABOLIC PANEL
ALT: 15 U/L (ref 0–35)
AST: 23 U/L (ref 0–37)
Albumin: 4.2 g/dL (ref 3.5–5.2)
Alkaline Phosphatase: 58 U/L (ref 39–117)
BUN: 17 mg/dL (ref 6–23)
CO2: 28 mEq/L (ref 19–32)
Calcium: 9.3 mg/dL (ref 8.4–10.5)
Chloride: 99 mEq/L (ref 96–112)
Creatinine, Ser: 0.91 mg/dL (ref 0.40–1.20)
GFR: 65.18 mL/min (ref >60.00)
Glucose, Bld: 98 mg/dL (ref 70–99)
Potassium: 4.2 mEq/L (ref 3.5–5.1)
Sodium: 132 mEq/L — ABNORMAL LOW (ref 135–145)
Total Bilirubin: 0.6 mg/dL (ref 0.2–1.2)
Total Protein: 7.1 g/dL (ref 6.0–8.3)

## 2022-07-02 LAB — URINALYSIS, ROUTINE W REFLEX MICROSCOPIC
Bilirubin Urine: NEGATIVE
Ketones, ur: NEGATIVE
Nitrite: NEGATIVE
Specific Gravity, Urine: 1.01 (ref 1.000–1.030)
Total Protein, Urine: NEGATIVE
Urine Glucose: NEGATIVE
Urobilinogen, UA: 0.2 (ref 0.0–1.0)
pH: 7.5 (ref 5.0–8.0)

## 2022-07-02 LAB — LIPID PANEL
Cholesterol: 242 mg/dL — ABNORMAL HIGH (ref 0–200)
HDL: 100.8 mg/dL (ref >39.00)
LDL Cholesterol: 134 mg/dL — ABNORMAL HIGH (ref 0–99)
NonHDL: 141.19
Total CHOL/HDL Ratio: 2
Triglycerides: 38 mg/dL (ref 0.0–149.0)
VLDL: 7.6 mg/dL (ref 0.0–40.0)

## 2022-07-02 LAB — TSH: TSH: 2.34 u[IU]/mL (ref 0.35–5.50)

## 2022-07-02 LAB — HEMOGLOBIN A1C: Hgb A1c MFr Bld: 5.9 % (ref 4.6–6.5)

## 2022-07-02 MED ORDER — CYCLOBENZAPRINE HCL 10 MG PO TABS: 10.0000 mg | ORAL_TABLET | Freq: Two times a day (BID) | ORAL | 1 refills | Status: DC | PRN

## 2022-07-02 MED ORDER — MELOXICAM 7.5 MG PO TABS: 7.5000 mg | ORAL_TABLET | Freq: Every day | ORAL | 2 refills | Status: DC

## 2022-07-02 MED ORDER — DIAZEPAM 2 MG PO TABS: 2.0000 mg | ORAL_TABLET | Freq: Three times a day (TID) | ORAL | 1 refills | Status: DC | PRN

## 2022-07-02 MED ORDER — TRIAMCINOLONE ACETONIDE 0.5 % EX OINT: 1.0000 | TOPICAL_OINTMENT | Freq: Three times a day (TID) | CUTANEOUS | 2 refills | Status: AC | PRN

## 2022-07-02 NOTE — Assessment & Plan Note (Signed)
Hydrate well Repeat GFR

## 2022-07-02 NOTE — Assessment & Plan Note (Signed)
On Allegra

## 2022-07-02 NOTE — Progress Notes (Signed)
Subjective:  Patient ID: Karen [name], female    DOB: 11-08-1955  Age: 67 y.o. MRN: [id number]  CC: No chief complaint on file.   HPI Karen [name] presents for finger cracks, muscle pains due to playing sports  Outpatient Medications Prior to Visit  Medication Sig Dispense Refill   Calcium-Vitamin D-Vitamin K (VIACTIV PO) Take 1 tablet by mouth 2 (two) times daily. CHEW     diclofenac sodium (VOLTAREN) 1 % GEL Apply 4 g topically 4 (four) times daily. (Patient taking differently: Apply 4 g topically See admin instructions. Apply 4 grams to the right knee four times daily) 100 g 3   diphenhydrAMINE (BENADRYL) 50 MG tablet Take 1 tablet (50 mg total) by mouth every 6 (six) hours as needed for itching or allergies. 30 tablet 1   docusate sodium (COLACE) 100 MG capsule Take 1 capsule (100 mg total) by mouth 2 (two) times daily. (Patient taking differently: Take 100 mg by mouth See admin instructions. Take 100 mg by mouth one to two times daily) 10 capsule 0   EPINEPHrine (EPIPEN 2-PAK) 0.3 mg/0.3 mL IJ SOAJ injection Inject 0.3 mg into the muscle as needed for anaphylaxis. 1 each 3   estrogen-methylTESTOSTERone 0.625-1.25 MG per tablet Take 1 tablet by mouth daily. 90 tablet 3   fexofenadine (ALLEGRA) 180 MG tablet Take 1 tablet (180 mg total) by mouth daily. 100 tablet 3   hydrocortisone 2.5 % cream Apply 1 application topically 2 (two) times daily as needed (for rashes).      meclizine (ANTIVERT) 25 MG tablet Take 1 tablet (25 mg total) by mouth 3 (three) times daily as needed for dizziness. 30 tablet 0   Multiple Vitamin (MULTIVITAMIN WITH MINERALS) TABS tablet Take 1 tablet by mouth daily.     olopatadine (PATANOL) 0.1 % ophthalmic solution Place 1 drop into both eyes daily as needed for allergies.      ondansetron (ZOFRAN) 4 MG tablet Take 1 tablet (4 mg total) by mouth every 8 (eight) hours as needed for nausea or vomiting. 20 tablet 0   polyethylene glycol (MIRALAX / GLYCOLAX) packet Take 17  g by mouth daily as needed for moderate constipation. (Patient taking differently: Take 17 g by mouth daily as needed for moderate constipation (MIX AND DRINK).) 14 each 0   pseudoephedrine (SUDAFED) 60 MG tablet Take 1 tablet (60 mg total) by mouth every 4 (four) hours as needed (allergic reaction). 60 tablet 1   vitamin C (ASCORBIC ACID) 500 MG tablet Take 500 mg by mouth daily.     cyclobenzaprine (FLEXERIL) 10 MG tablet Take 1 tablet (10 mg total) by mouth 2 (two) times daily as needed for muscle spasms. 20 tablet 0   diazepam (VALIUM) 2 MG tablet Take 1 tablet (2 mg total) by mouth every 8 (eight) hours as needed for anxiety. 60 tablet 1   meloxicam (MOBIC) 7.5 MG tablet Take 1 tablet (7.5 mg total) by mouth daily. 14 tablet 0   No facility-administered medications prior to visit.    ROS: Review of Systems  Constitutional:  Negative for activity change, appetite change, chills, fatigue and unexpected weight change.  HENT:  Negative for congestion, mouth sores and sinus pressure.   Eyes:  Negative for visual disturbance.  Respiratory:  Negative for cough and chest tightness.   Gastrointestinal:  Negative for abdominal pain and nausea.  Genitourinary:  Negative for difficulty urinating, frequency and vaginal pain.  Musculoskeletal:  Positive for arthralgias and myalgias. Negative for back  pain and gait problem.  Skin:  Positive for rash. Negative for pallor.  Neurological:  Negative for dizziness, tremors, weakness, numbness and headaches.  Psychiatric/Behavioral:  Negative for confusion and sleep disturbance.     Objective:  BP 120/72 (BP Location: Left Arm, Patient Position: Sitting, Cuff Size: Large)   Pulse (!) 56   Temp 98.2 F (36.8 C) (Oral)   Ht '5\' 4"'$  (1.626 m)   Wt 129 lb (58.5 kg)   SpO2 99%   BMI 22.14 kg/m   BP Readings from Last 3 Encounters:  07/02/22 120/72  12/29/21 120/70  12/24/21 110/80    Wt Readings from Last 3 Encounters:  07/02/22 129 lb (58.5 kg)   12/29/21 132 lb 9.6 oz (60.1 kg)  12/15/21 135 lb (61.2 kg)    Physical Exam Constitutional:      General: She is not in acute distress.    Appearance: Normal appearance. She is well-developed.  HENT:     Head: Normocephalic.     Right Ear: External ear normal.     Left Ear: External ear normal.     Nose: Nose normal.  Eyes:     General:        Right eye: No discharge.        Left eye: No discharge.     Conjunctiva/sclera: Conjunctivae normal.     Pupils: Pupils are equal, round, and reactive to light.  Neck:     Thyroid: No thyromegaly.     Vascular: No JVD.     Trachea: No tracheal deviation.  Cardiovascular:     Rate and Rhythm: Normal rate and regular rhythm.     Heart sounds: Normal heart sounds.  Pulmonary:     Effort: No respiratory distress.     Breath sounds: No stridor. No wheezing.  Abdominal:     General: Bowel sounds are normal. There is no distension.     Palpations: Abdomen is soft. There is no mass.     Tenderness: There is no abdominal tenderness. There is no guarding or rebound.  Musculoskeletal:        General: No tenderness.     Cervical back: Normal range of motion and neck supple. No rigidity.  Lymphadenopathy:     Cervical: No cervical adenopathy.  Skin:    Findings: No erythema or rash.  Neurological:     Cranial Nerves: No cranial nerve deficit.     Motor: No abnormal muscle tone.     Coordination: Coordination normal.     Deep Tendon Reflexes: Reflexes normal.  Psychiatric:        Behavior: Behavior normal.        Thought Content: Thought content normal.        Judgment: Judgment normal.   Fingers w/skin cracks  Lab Results  Component Value Date   WBC 5.3 09/15/2021   HGB 12.5 09/15/2021   HCT 37.4 09/15/2021   PLT 205 09/15/2021   GLUCOSE 128 (H) 09/15/2021   CHOL 242 (H) 06/30/2021   TRIG 43.0 06/30/2021   HDL 97.60 06/30/2021   LDLDIRECT 134.0 01/25/2012   LDLCALC 135 (H) 06/30/2021   ALT 17 09/15/2021   AST 23 09/15/2021    NA 136 09/15/2021   K 4.1 09/15/2021   CL 104 09/15/2021   CREATININE 1.28 (H) 09/15/2021   BUN 13 09/15/2021   CO2 27 09/15/2021   TSH 2.98 06/30/2021   INR 1.0 03/28/2019    MM 3D SCREEN BREAST BILATERAL  Result Date: 01/05/2022  CLINICAL DATA:  Screening. EXAM: DIGITAL SCREENING BILATERAL MAMMOGRAM WITH TOMOSYNTHESIS AND CAD TECHNIQUE: Bilateral screening digital craniocaudal and mediolateral oblique mammograms were obtained. Bilateral screening digital breast tomosynthesis was performed. The images were evaluated with computer-aided detection. COMPARISON:  Previous exam(s). ACR Breast Density Category b: There are scattered areas of fibroglandular density. FINDINGS: There are no findings suspicious for malignancy. IMPRESSION: No mammographic evidence of malignancy. A result letter of this screening mammogram will be mailed directly to the patient. RECOMMENDATION: Screening mammogram in one year. (Code:SM-B-01Y) BI-RADS CATEGORY  1: Negative. Electronically Signed   By: Audie Pinto M.D.   On: 01/05/2022 08:12    Assessment & Plan:   Problem List Items Addressed This Visit       Respiratory   Allergic rhinitis    On Allegra        Musculoskeletal and Integument   Muscle spasms of neck    On Diazepam, Flexeril  prn  Potential benefits of a long term benzodiazepines  use as well as potential risks  and complications were explained to the patient and were aknowledged.      Dry skin    Fingers - Triamc oint prn        Other   Well adult exam   Relevant Orders   CBC with Differential/Platelet   Comprehensive metabolic panel   TSH   Lipid panel   Urinalysis, Routine w reflex microscopic   Hyperglycemia - Primary   Relevant Orders   Comprehensive metabolic panel   Hemoglobin A1c   Decreased GFR    Hydrate well Repeat GFR      Relevant Orders   CBC with Differential/Platelet   Urinalysis, Routine w reflex microscopic      Meds ordered this encounter   Medications   cyclobenzaprine (FLEXERIL) 10 MG tablet    Sig: Take 1 tablet (10 mg total) by mouth 2 (two) times daily as needed for muscle spasms.    Dispense:  60 tablet    Refill:  1   meloxicam (MOBIC) 7.5 MG tablet    Sig: Take 1 tablet (7.5 mg total) by mouth daily.    Dispense:  30 tablet    Refill:  2   diazepam (VALIUM) 2 MG tablet    Sig: Take 1 tablet (2 mg total) by mouth every 8 (eight) hours as needed for anxiety.    Dispense:  60 tablet    Refill:  1   triamcinolone ointment (KENALOG) 0.5 %    Sig: Apply 1 Application topically 3 (three) times daily as needed.    Dispense:  90 g    Refill:  2      Follow-up: No follow-ups on file.  Walker Kehr, MD

## 2022-07-02 NOTE — Assessment & Plan Note (Signed)
On Diazepam, Flexeril  prn  Potential benefits of a long term benzodiazepines  use as well as potential risks  and complications were explained to the patient and were aknowledged.

## 2022-07-02 NOTE — Assessment & Plan Note (Signed)
Fingers - Triamc oint prn

## 2022-07-27 ENCOUNTER — Ambulatory Visit (INDEPENDENT_AMBULATORY_CARE_PROVIDER_SITE_OTHER): Payer: Medicare Other | Admitting: Podiatry

## 2022-07-27 DIAGNOSIS — B351 Tinea unguium: Secondary | ICD-10-CM | POA: Diagnosis not present

## 2022-07-27 DIAGNOSIS — M79674 Pain in right toe(s): Secondary | ICD-10-CM | POA: Diagnosis not present

## 2022-07-27 DIAGNOSIS — M79675 Pain in left toe(s): Secondary | ICD-10-CM | POA: Diagnosis not present

## 2022-07-27 NOTE — Patient Instructions (Signed)
[email or web address]

## 2022-07-27 NOTE — Progress Notes (Signed)
Subjective: Chief Complaint  Patient presents with   Nail Problem    Nail trim      67 year old female with the above concerns. She states that the nails are about the same.  No swelling redness or any drainage.  No open lesions.  Objective: AAO x3, NAD DP/PT pulses palpable bilaterally, CRT less than 3 seconds Nails continue be hypertrophic, dystrophic with yellow, brown discoloration.  They to be about the same. No pain with calf compression, swelling, warmth, erythema  Assessment: 67 year old female with symptomatic onychomycosis, onychodystrophy  Plan: -All treatment options discussed with the patient including all alternatives, risks, complications.  -Sharply debrided x10 without any complications or bleeding.  Continue the compound cream from her dermatologist and I recommended going back to using that as it includes urea.  Discussed urea nail gel as well.  We also discussed nail restoration systems. -Patient encouraged to call the office with any questions, concerns, change in symptoms.   Vivi Barrack DPM

## 2022-09-15 ENCOUNTER — Ambulatory Visit (INDEPENDENT_AMBULATORY_CARE_PROVIDER_SITE_OTHER): Payer: Medicare Other

## 2022-09-15 ENCOUNTER — Ambulatory Visit (INDEPENDENT_AMBULATORY_CARE_PROVIDER_SITE_OTHER): Payer: Medicare Other | Admitting: Family Medicine

## 2022-09-15 ENCOUNTER — Other Ambulatory Visit: Payer: Self-pay | Admitting: Family Medicine

## 2022-09-15 ENCOUNTER — Encounter: Payer: Self-pay | Admitting: Family Medicine

## 2022-09-15 VITALS — BP 120/84 | HR 54 | Ht 64.0 in | Wt 133.0 lb

## 2022-09-15 DIAGNOSIS — M545 Low back pain, unspecified: Secondary | ICD-10-CM

## 2022-09-15 MED ORDER — KETOROLAC TROMETHAMINE 60 MG/2ML IM SOLN
60.0000 mg | Freq: Once | INTRAMUSCULAR | Status: AC
Start: 2022-09-15 — End: 2022-09-15
  Administered 2022-09-15: 60 mg via INTRAMUSCULAR

## 2022-09-15 MED ORDER — METHYLPREDNISOLONE ACETATE 80 MG/ML IJ SUSP
80.0000 mg | Freq: Once | INTRAMUSCULAR | Status: AC
  Administered 2022-09-15: 80 mg via INTRAMUSCULAR

## 2022-09-15 NOTE — Progress Notes (Signed)
[name] [address] [place] [address] Phone: [phone] Subjective:    I'm seeing this patient by the request  of:  Plotnikov, Karen Quint, MD  CC: Low back pain  [id number]  Karen [name] is a 67 y.o. female coming in with complaint of back and neck pain Patient states that last night she threw out her low back doing yard work and is having spam's, this has happened 8 years ago, she is not able to bend over , states she was in the hospital a year ago for something similar , she states she was told she has 2 spinal fx that are just there. She has pain with all movements , "she is not able to lift her legs " she was able to lift them. Right sided low back pain, states she has had back pain for years but has been able to manage, she states this time its like really bad   Medications patient has been prescribed: Meloxicam and cyclobenzaprine  Taking: Yes       Past Medical History:  Diagnosis Date   Arthritis    right   Climacteric    on HRT   Complication of anesthesia    Epiglottitis 1959   tracheotomy   Finger pain    pain with pressure on the PIP joint with hemorrhage and swelling. MRI hand inconclusive    History of varicella    Lumbar back pain 1999    initial strain. Intermittent pain since. Re-injured Dec '11. Had PT IN THE PAST.   Plantar fasciitis    Pneumonia 1993   hospitalized   PONV (postoperative nausea and vomiting)     Allergies  Allergen Reactions   Penicillins Anaphylaxis    Did it involve swelling of the face/tongue/throat, SOB, or low BP? Yes Did it involve sudden or severe rash/hives, skin peeling, or any reaction on the inside of your mouth or nose? Yes Did you need to seek medical attention at a hospital or doctor's office? Yes When did it last happen? "more than 10 years ago"    If all above answers are "NO", may proceed with cephalosporin use.    Hydrocodone Nausea Only   Meclizine     Headache    Singulair [Montelukast Sodium] Other (See Comments)    "Numbness"   Adhesive [Tape] Hives and Rash     Review of Systems:  No headache, visual changes, nausea, vomiting, diarrhea, constipation, dizziness, abdominal pain, skin rash, fevers, chills, night sweats, weight loss, swollen lymph nodes, body aches, joint swelling, chest pain, shortness of breath, mood changes. POSITIVE muscle aches  Objective  Blood pressure 120/84, pulse (!) 54, height 5\' 4"  (1.626 m), weight 133 lb (60.3 kg), SpO2 100 %.   General: No apparent distress alert and oriented x3 mood and affect normal, dressed appropriately.  HEENT: Pupils equal, extraocular movements intact  Respiratory: Patient's speak in full sentences and does not appear short of breath  Cardiovascular: No lower extremity edema, non tender, no erythema  Gait MSK:  Back does have some loss of lordosis.  Tightness with worsening pain with hip flexion against resistance.  Patient does have a negative straight leg test though noted with passively moving.  Patient does not have any true weakness of the lower extremities.  Tender to palpation a little over the sacroiliac joint but seems to be more of the paraspinal musculature Neurovascular intact distally       Assessment and Plan:  Lumbar  back pain Acute flare noted.  Patient has had this pain intermittently for some time.  This seems to be more secondary to muscle spasm than anything else today.  Discussed with patient about icing regimen and home exercises, discussed which activities to do and which ones to avoid.  Increase activity slowly over the course of next several weeks.  Patient will follow-up with me again in 4 weeks.  Worsening pain or if patient is getting dysuria to seek medical attention immediately.  Toradol and Depo-Medrol given.  Patient has meloxicam and cyclobenzaprine which we did discuss with her as well as her significant other.         The above documentation has been  reviewed and is accurate and complete Karen Saa, DO         Note: This dictation was prepared with Dragon dictation along with smaller phrase technology. Any transcriptional errors that result from this process are unintentional.

## 2022-09-15 NOTE — Assessment & Plan Note (Addendum)
Acute flare noted.  Patient has had this pain intermittently for some time.  This seems to be more secondary to muscle spasm than anything else today.  Discussed with patient about icing regimen and home exercises, discussed which activities to do and which ones to avoid.  Increase activity slowly over the course of next several weeks.  Patient will follow-up with me again in 4 weeks.  Worsening pain or if patient is getting dysuria to seek medical attention immediately.  Toradol and Depo-Medrol given.  Patient has meloxicam and cyclobenzaprine which we did discuss with her as well as her significant other.

## 2022-09-15 NOTE — Addendum Note (Signed)
Addended by: Evon Slack on: 09/15/2022 01:29 PM   Modules accepted: Orders

## 2022-09-15 NOTE — Patient Instructions (Addendum)
Xrays on the way out  Meloxicam daily starting tomorrow 10 days and flexeril nightly for at least 7 days  4 week follow up
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
## 2022-10-15 ENCOUNTER — Ambulatory Visit: Payer: Medicare Other | Admitting: Family Medicine

## 2022-11-02 ENCOUNTER — Ambulatory Visit (INDEPENDENT_AMBULATORY_CARE_PROVIDER_SITE_OTHER): Payer: Medicare Other | Admitting: Podiatry

## 2022-11-02 DIAGNOSIS — M79675 Pain in left toe(s): Secondary | ICD-10-CM | POA: Diagnosis not present

## 2022-11-02 DIAGNOSIS — M79674 Pain in right toe(s): Secondary | ICD-10-CM | POA: Diagnosis not present

## 2022-11-02 DIAGNOSIS — B351 Tinea unguium: Secondary | ICD-10-CM

## 2022-11-03 NOTE — Progress Notes (Signed)
Subjective: Chief Complaint  Patient presents with   Nail Problem    Patient came in today for routine foot care, nail trim, nail are thick and yellow, patient would like to talk about a nail fungus gel,      67 year old female with the above concerns. She states that the nails are about the same.  She is asked about going back on the compound medication that she was on previously.  This was originally prescribed by dermatology.  She has had a culture performed several times she states that were negative for fungus.  No swelling redness or any drainage.  No open lesions.  Objective: AAO x3, NAD DP/PT pulses palpable bilaterally, CRT less than 3 seconds Nails continue be hypertrophic, dystrophic with  brown discoloration.  They to be about the same.  There is some dried blood on the distal part of the right toenail.  There is no edema, erythema or signs of infection of the toenail sites. No pain with calf compression, swelling, warmth, erythema  Assessment: 67 year old female with symptomatic onychomycosis, onychodystrophy  Plan: -All treatment options discussed with the patient including all alternatives, risks, complications.  -Sharply debrided x10 without any complications or bleeding.  Continue the compound cream from her dermatologist and [facility]. -Patient encouraged to call the office with any questions, concerns, change in symptoms.   Vivi Barrack DPM

## 2022-12-03 ENCOUNTER — Encounter (INDEPENDENT_AMBULATORY_CARE_PROVIDER_SITE_OTHER): Payer: Self-pay

## 2022-12-07 ENCOUNTER — Telehealth: Payer: Self-pay | Admitting: Internal Medicine

## 2022-12-07 NOTE — Telephone Encounter (Signed)
PT is experiencing travelers constipation. She is requesting a call back to further discuss her symptoms.

## 2022-12-07 NOTE — Telephone Encounter (Signed)
Pt is travelling and having constipation. States she went to urgent care and was told to use enemas.She feels like there is still some stool that needs to come out. Discussed with pt that she can try a miralax purge. She was instructed to mix 7 doses of miralax with 32oz of gatorade and drink 1 cup every until gone. Pt verbalized understanding.

## 2022-12-16 ENCOUNTER — Other Ambulatory Visit: Payer: Self-pay | Admitting: Obstetrics and Gynecology

## 2022-12-16 DIAGNOSIS — Z1231 Encounter for screening mammogram for malignant neoplasm of breast: Secondary | ICD-10-CM

## 2022-12-30 ENCOUNTER — Encounter: Payer: Self-pay | Admitting: Internal Medicine

## 2022-12-30 ENCOUNTER — Ambulatory Visit (INDEPENDENT_AMBULATORY_CARE_PROVIDER_SITE_OTHER): Payer: Medicare Other | Admitting: Internal Medicine

## 2022-12-30 VITALS — BP 128/78 | HR 45 | Temp 97.7°F | Ht 64.0 in | Wt 133.4 lb

## 2022-12-30 DIAGNOSIS — J4521 Mild intermittent asthma with (acute) exacerbation: Secondary | ICD-10-CM

## 2022-12-30 DIAGNOSIS — M545 Low back pain, unspecified: Secondary | ICD-10-CM | POA: Diagnosis not present

## 2022-12-30 DIAGNOSIS — Z23 Encounter for immunization: Secondary | ICD-10-CM

## 2022-12-30 DIAGNOSIS — R739 Hyperglycemia, unspecified: Secondary | ICD-10-CM | POA: Diagnosis not present

## 2022-12-30 DIAGNOSIS — F419 Anxiety disorder, unspecified: Secondary | ICD-10-CM

## 2022-12-30 DIAGNOSIS — R944 Abnormal results of kidney function studies: Secondary | ICD-10-CM | POA: Diagnosis not present

## 2022-12-30 DIAGNOSIS — E785 Hyperlipidemia, unspecified: Secondary | ICD-10-CM | POA: Diagnosis not present

## 2022-12-30 LAB — COMPREHENSIVE METABOLIC PANEL
ALT: 15 U/L (ref 0–35)
AST: 23 U/L (ref 0–37)
Albumin: 3.8 g/dL (ref 3.5–5.2)
Alkaline Phosphatase: 52 U/L (ref 39–117)
BUN: 19 mg/dL (ref 6–23)
CO2: 27 meq/L (ref 19–32)
Calcium: 8.8 mg/dL (ref 8.4–10.5)
Chloride: 99 meq/L (ref 96–112)
Creatinine, Ser: 0.95 mg/dL (ref 0.40–1.20)
GFR: 61.68 mL/min (ref >60.00)
Glucose, Bld: 93 mg/dL (ref 70–99)
Potassium: 4.1 meq/L (ref 3.5–5.1)
Sodium: 130 meq/L — ABNORMAL LOW (ref 135–145)
Total Bilirubin: 0.4 mg/dL (ref 0.2–1.2)
Total Protein: 6.5 g/dL (ref 6.0–8.3)

## 2022-12-30 LAB — HEMOGLOBIN A1C: Hgb A1c MFr Bld: 6.2 % (ref 4.6–6.5)

## 2022-12-30 MED ORDER — CYCLOBENZAPRINE HCL 10 MG PO TABS: 10.0000 mg | ORAL_TABLET | Freq: Two times a day (BID) | ORAL | 1 refills | Status: DC | PRN

## 2022-12-30 MED ORDER — MELOXICAM 7.5 MG PO TABS: 7.5000 mg | ORAL_TABLET | Freq: Every day | ORAL | 0 refills | Status: DC | PRN

## 2022-12-30 MED ORDER — DIAZEPAM 2 MG PO TABS: 2.0000 mg | ORAL_TABLET | Freq: Three times a day (TID) | ORAL | 1 refills | Status: DC | PRN

## 2022-12-30 NOTE — Assessment & Plan Note (Signed)
Occasional Diazepam prn  Potential benefits of a long term opioids use as well as potential risks (i.e. addiction risk, apnea etc) and complications (i.e. Somnolence, constipation and others) were explained to the patient and were aknowledged.

## 2022-12-30 NOTE — Addendum Note (Signed)
Addended by: Aundra Millet on: 12/30/2022 08:36 AM   Modules accepted: Orders

## 2022-12-30 NOTE — Assessment & Plan Note (Signed)
Doing well.  Treat allergies.

## 2022-12-30 NOTE — Assessment & Plan Note (Signed)
Hydrate well Repeat GFR

## 2022-12-30 NOTE — Progress Notes (Signed)
Subjective:  Patient ID: Karen [name], female    DOB: 1955-05-28  Age: 67 y.o. MRN: [id number]  CC: No chief complaint on file.   HPI Karen [name] presents for LBP, anxiety, constipation when traveling  Outpatient Medications Prior to Visit  Medication Sig Dispense Refill   Calcium-Vitamin D-Vitamin K (VIACTIV PO) Take 1 tablet by mouth 2 (two) times daily. CHEW     diclofenac sodium (VOLTAREN) 1 % GEL Apply 4 g topically 4 (four) times daily. (Patient taking differently: Apply 4 g topically See admin instructions. Apply 4 grams to the right knee four times daily) 100 g 3   diphenhydrAMINE (BENADRYL) 50 MG tablet Take 1 tablet (50 mg total) by mouth every 6 (six) hours as needed for itching or allergies. 30 tablet 1   docusate sodium (COLACE) 100 MG capsule Take 1 capsule (100 mg total) by mouth 2 (two) times daily. (Patient taking differently: Take 100 mg by mouth See admin instructions. Take 100 mg by mouth one to two times daily) 10 capsule 0   EPINEPHrine (EPIPEN 2-PAK) 0.3 mg/0.3 mL IJ SOAJ injection Inject 0.3 mg into the muscle as needed for anaphylaxis. 1 each 3   estrogen-methylTESTOSTERone 0.625-1.25 MG per tablet Take 1 tablet by mouth daily. 90 tablet 3   fexofenadine (ALLEGRA) 180 MG tablet Take 1 tablet (180 mg total) by mouth daily. 100 tablet 3   hydrocortisone 2.5 % cream Apply 1 application topically 2 (two) times daily as needed (for rashes).      meclizine (ANTIVERT) 25 MG tablet Take 1 tablet (25 mg total) by mouth 3 (three) times daily as needed for dizziness. 30 tablet 0   Multiple Vitamin (MULTIVITAMIN WITH MINERALS) TABS tablet Take 1 tablet by mouth daily.     olopatadine (PATANOL) 0.1 % ophthalmic solution Place 1 drop into both eyes daily as needed for allergies.      ondansetron (ZOFRAN) 4 MG tablet Take 1 tablet (4 mg total) by mouth every 8 (eight) hours as needed for nausea or vomiting. 20 tablet 0   polyethylene glycol (MIRALAX / GLYCOLAX) packet Take 17 g by  mouth daily as needed for moderate constipation. (Patient taking differently: Take 17 g by mouth daily as needed for moderate constipation (MIX AND DRINK).) 14 each 0   pseudoephedrine (SUDAFED) 60 MG tablet Take 1 tablet (60 mg total) by mouth every 4 (four) hours as needed (allergic reaction). 60 tablet 1   triamcinolone ointment (KENALOG) 0.5 % Apply 1 Application topically 3 (three) times daily as needed. 90 g 2   vitamin C (ASCORBIC ACID) 500 MG tablet Take 500 mg by mouth daily.     cyclobenzaprine (FLEXERIL) 10 MG tablet Take 1 tablet (10 mg total) by mouth 2 (two) times daily as needed for muscle spasms. 60 tablet 1   diazepam (VALIUM) 2 MG tablet Take 1 tablet (2 mg total) by mouth every 8 (eight) hours as needed for anxiety. 60 tablet 1   meloxicam (MOBIC) 7.5 MG tablet Take 1 tablet (7.5 mg total) by mouth daily. 30 tablet 2   No facility-administered medications prior to visit.    ROS: Review of Systems  Constitutional:  Negative for activity change, appetite change, chills, fatigue and unexpected weight change.  HENT:  Negative for congestion, mouth sores and sinus pressure.   Eyes:  Negative for visual disturbance.  Respiratory:  Negative for cough and chest tightness.   Gastrointestinal:  Negative for abdominal pain and nausea.  Genitourinary:  Negative for  difficulty urinating, frequency and vaginal pain.  Musculoskeletal:  Positive for back pain. Negative for gait problem.  Skin:  Negative for pallor and rash.  Neurological:  Negative for dizziness, tremors, weakness, numbness and headaches.  Psychiatric/Behavioral:  Negative for confusion and sleep disturbance. The patient is nervous/anxious.     Objective:  BP 128/78 (BP Location: Left Arm, Patient Position: Sitting, Cuff Size: Normal)   Pulse (!) 45   Temp 97.7 F (36.5 C) (Oral)   Ht 5\' 4"  (1.626 m)   Wt 133 lb 6.4 oz (60.5 kg)   SpO2 99%   BMI 22.90 kg/m   BP Readings from Last 3 Encounters:  12/30/22 128/78   09/15/22 120/84  07/02/22 120/72    Wt Readings from Last 3 Encounters:  12/30/22 133 lb 6.4 oz (60.5 kg)  09/15/22 133 lb (60.3 kg)  07/02/22 129 lb (58.5 kg)    Physical Exam Constitutional:      General: She is not in acute distress.    Appearance: Normal appearance. She is well-developed.  HENT:     Head: Normocephalic.     Right Ear: External ear normal.     Left Ear: External ear normal.     Nose: Nose normal.  Eyes:     General:        Right eye: No discharge.        Left eye: No discharge.     Conjunctiva/sclera: Conjunctivae normal.     Pupils: Pupils are equal, round, and reactive to light.  Neck:     Thyroid: No thyromegaly.     Vascular: No JVD.     Trachea: No tracheal deviation.  Cardiovascular:     Rate and Rhythm: Normal rate and regular rhythm.     Heart sounds: Normal heart sounds.  Pulmonary:     Effort: No respiratory distress.     Breath sounds: No stridor. No wheezing.  Abdominal:     General: Bowel sounds are normal. There is no distension.     Palpations: Abdomen is soft. There is no mass.     Tenderness: There is no abdominal tenderness. There is no guarding or rebound.  Musculoskeletal:        General: No tenderness.     Cervical back: Normal range of motion and neck supple. No rigidity.  Lymphadenopathy:     Cervical: No cervical adenopathy.  Skin:    Findings: No erythema or rash.  Neurological:     Cranial Nerves: No cranial nerve deficit.     Motor: No abnormal muscle tone.     Coordination: Coordination normal.     Deep Tendon Reflexes: Reflexes normal.  Psychiatric:        Behavior: Behavior normal.        Thought Content: Thought content normal.        Judgment: Judgment normal.     Lab Results  Component Value Date   WBC 4.3 07/02/2022   HGB 13.7 07/02/2022   HCT 40.2 07/02/2022   PLT 221.0 07/02/2022   GLUCOSE 98 07/02/2022   CHOL 242 (H) 07/02/2022   TRIG 38.0 07/02/2022   HDL 100.80 07/02/2022   LDLDIRECT 134.0  01/25/2012   LDLCALC 134 (H) 07/02/2022   ALT 15 07/02/2022   AST 23 07/02/2022   NA 132 (L) 07/02/2022   K 4.2 07/02/2022   CL 99 07/02/2022   CREATININE 0.91 07/02/2022   BUN 17 07/02/2022   CO2 28 07/02/2022   TSH 2.34 07/02/2022  INR 1.0 03/28/2019   HGBA1C 5.9 07/02/2022    MM 3D SCREEN BREAST BILATERAL  Result Date: 01/05/2022 CLINICAL DATA:  Screening. EXAM: DIGITAL SCREENING BILATERAL MAMMOGRAM WITH TOMOSYNTHESIS AND CAD TECHNIQUE: Bilateral screening digital craniocaudal and mediolateral oblique mammograms were obtained. Bilateral screening digital breast tomosynthesis was performed. The images were evaluated with computer-aided detection. COMPARISON:  Previous exam(s). ACR Breast Density Category b: There are scattered areas of fibroglandular density. FINDINGS: There are no findings suspicious for malignancy. IMPRESSION: No mammographic evidence of malignancy. A result letter of this screening mammogram will be mailed directly to the patient. RECOMMENDATION: Screening mammogram in one year. (Code:SM-B-01Y) BI-RADS CATEGORY  1: Negative. Electronically Signed   By: Emmaline Kluver M.D.   On: 01/05/2022 08:12    Assessment & Plan:   Problem List Items Addressed This Visit     Lumbar back pain - Primary    Chronic Meloxicam prn  Potential benefits of a long term NSAID use as well as potential risks  and complications were explained to the patient and were aknowledged. Flexeril prn      Relevant Medications   meloxicam (MOBIC) 7.5 MG tablet   cyclobenzaprine (FLEXERIL) 10 MG tablet   Asthmatic bronchitis    Doing well.  Treat allergies.      Anxiety    Occasional Diazepam prn  Potential benefits of a long term opioids use as well as potential risks (i.e. addiction risk, apnea etc) and complications (i.e. Somnolence, constipation and others) were explained to the patient and were aknowledged.      Relevant Medications   diazepam (VALIUM) 2 MG tablet   Decreased  GFR    Hydrate well Repeat GFR      Hyperglycemia    Check A1c      Relevant Orders   Comprehensive metabolic panel   Hemoglobin A1c   Dyslipidemia    2022 Coronary calcium CT score of 0. This was 0 percentile for age-, race-, and sex-matched controls.      Relevant Orders   Comprehensive metabolic panel   Hemoglobin A1c      Meds ordered this encounter  Medications   diazepam (VALIUM) 2 MG tablet    Sig: Take 1 tablet (2 mg total) by mouth every 8 (eight) hours as needed for anxiety.    Dispense:  60 tablet    Refill:  1   meloxicam (MOBIC) 7.5 MG tablet    Sig: Take 1 tablet (7.5 mg total) by mouth daily as needed for pain.    Dispense:  30 tablet    Refill:  0   cyclobenzaprine (FLEXERIL) 10 MG tablet    Sig: Take 1 tablet (10 mg total) by mouth 2 (two) times daily as needed for muscle spasms.    Dispense:  60 tablet    Refill:  1      Follow-up: No follow-ups on file.  Sonda Primes, MD

## 2022-12-30 NOTE — Assessment & Plan Note (Signed)
Chronic Meloxicam prn  Potential benefits of a long term NSAID use as well as potential risks  and complications were explained to the patient and were aknowledged. Flexeril prn

## 2022-12-30 NOTE — Assessment & Plan Note (Signed)
2022 Coronary calcium CT score of 0. This was 0 percentile for age-, race-, and sex-matched controls.

## 2022-12-30 NOTE — Assessment & Plan Note (Signed)
Check A1c. 

## 2023-01-04 ENCOUNTER — Ambulatory Visit (INDEPENDENT_AMBULATORY_CARE_PROVIDER_SITE_OTHER): Payer: Medicare Other | Admitting: Podiatry

## 2023-01-04 DIAGNOSIS — B351 Tinea unguium: Secondary | ICD-10-CM

## 2023-01-04 DIAGNOSIS — M79674 Pain in right toe(s): Secondary | ICD-10-CM

## 2023-01-04 DIAGNOSIS — M79675 Pain in left toe(s): Secondary | ICD-10-CM

## 2023-01-04 NOTE — Progress Notes (Signed)
Subjective: Chief Complaint  Patient presents with   Nail Problem    Nail trim     67 year old female with the above concerns. She states that the nails are about the same.  She has been using the compounded medication. No drainage, swelling or signs of infection.   Objective: AAO x3, NAD DP/PT pulses palpable bilaterally, CRT less than 3 seconds Nails continue be hypertrophic, dystrophic with  brown discoloration.  Clinically, the nails appear to be about the same. There is some dried blood on the distal part of the left hallux toenail.  There is no edema, erythema or signs of infection of the toenail sites. No pain with calf compression, swelling, warmth, erythema  Assessment: 67 year old female with symptomatic onychomycosis, onychodystrophy  Plan: -All treatment options discussed with the patient including all alternatives, risks, complications.  -Sharply debrided x10 without any complications or bleeding.  Continue the compound cream. We discussed switching to something different in the future if we are not seeing further improvement. Unfortunately, I do think that a lot of her issues are from trauma to the nail which makes it difficult to treat.  -Patient encouraged to call the office with any questions, concerns, change in symptoms.   Vivi Barrack DPM

## 2023-01-06 ENCOUNTER — Ambulatory Visit
Admission: RE | Admit: 2023-01-06 | Discharge: 2023-01-06 | Disposition: A | Discharge status: Home / Self Care | Payer: Medicare Other | Source: Ambulatory Visit | Attending: Obstetrics and Gynecology | Admitting: Obstetrics and Gynecology

## 2023-01-06 DIAGNOSIS — Z1231 Encounter for screening mammogram for malignant neoplasm of breast: Secondary | ICD-10-CM

## 2023-04-05 ENCOUNTER — Encounter: Payer: Self-pay | Admitting: Podiatry

## 2023-04-05 ENCOUNTER — Ambulatory Visit (INDEPENDENT_AMBULATORY_CARE_PROVIDER_SITE_OTHER): Payer: Medicare Other | Admitting: Podiatry

## 2023-04-05 DIAGNOSIS — B351 Tinea unguium: Secondary | ICD-10-CM | POA: Diagnosis not present

## 2023-04-05 DIAGNOSIS — M79675 Pain in left toe(s): Secondary | ICD-10-CM | POA: Diagnosis not present

## 2023-04-05 DIAGNOSIS — M79674 Pain in right toe(s): Secondary | ICD-10-CM | POA: Diagnosis not present

## 2023-04-05 NOTE — Progress Notes (Signed)
Subjective: Chief Complaint  Patient presents with   RFC    RM#11 RFC patient has no other concerns today.     67 year old female with the above concerns.  States she is doing well.  She states that she has discontinued the topical medication as it is not really helping.  She does have any alternative they are thick and she cannot do them himself may cause discomfort.  No open lesions.  No drainage.    Objective: AAO x3, NAD DP/PT pulses palpable bilaterally, CRT less than 3 seconds Nails continue be hypertrophic, dystrophic with  brown discoloration.  Clinically, the nails appear to be about the same. There is some dried blood on the distal part of the left hallux toenail.  There is no edema, erythema or signs of infection of the toenail sites. No pain with calf compression, swelling, warmth, erythema  Assessment: 67 year old female with symptomatic onychomycosis, onychodystrophy  Plan: -All treatment options discussed with the patient including all alternatives, risks, complications.  -Sharply debrided x10 without any complications or bleeding.  At this time wants to proceed with routine debridement.  Monitoring signs or symptoms of infection.  Return in about 3 months (around 07/04/2023) for nail trim .  Vivi Barrack DPM

## 2023-06-30 ENCOUNTER — Ambulatory Visit (INDEPENDENT_AMBULATORY_CARE_PROVIDER_SITE_OTHER): Payer: Medicare Other | Admitting: Internal Medicine

## 2023-06-30 ENCOUNTER — Encounter: Payer: Self-pay | Admitting: Internal Medicine

## 2023-06-30 VITALS — BP 118/80 | HR 64 | Temp 98.2°F | Ht 64.0 in | Wt 134.0 lb

## 2023-06-30 DIAGNOSIS — H1013 Acute atopic conjunctivitis, bilateral: Secondary | ICD-10-CM

## 2023-06-30 DIAGNOSIS — J4521 Mild intermittent asthma with (acute) exacerbation: Secondary | ICD-10-CM | POA: Diagnosis not present

## 2023-06-30 DIAGNOSIS — N1831 Chronic kidney disease, stage 3a: Secondary | ICD-10-CM

## 2023-06-30 DIAGNOSIS — R739 Hyperglycemia, unspecified: Secondary | ICD-10-CM

## 2023-06-30 DIAGNOSIS — M545 Low back pain, unspecified: Secondary | ICD-10-CM | POA: Diagnosis not present

## 2023-06-30 DIAGNOSIS — L719 Rosacea, unspecified: Secondary | ICD-10-CM | POA: Insufficient documentation

## 2023-06-30 DIAGNOSIS — F419 Anxiety disorder, unspecified: Secondary | ICD-10-CM

## 2023-06-30 LAB — COMPREHENSIVE METABOLIC PANEL
ALT: 17 U/L (ref 0–35)
AST: 23 U/L (ref 0–37)
Albumin: 4.1 g/dL (ref 3.5–5.2)
Alkaline Phosphatase: 51 U/L (ref 39–117)
BUN: 18 mg/dL (ref 6–23)
CO2: 27 meq/L (ref 19–32)
Calcium: 8.9 mg/dL (ref 8.4–10.5)
Chloride: 98 meq/L (ref 96–112)
Creatinine, Ser: 0.89 mg/dL (ref 0.40–1.20)
GFR: 66.47 mL/min (ref >60.00)
Glucose, Bld: 96 mg/dL (ref 70–99)
Potassium: 3.7 meq/L (ref 3.5–5.1)
Sodium: 130 meq/L — ABNORMAL LOW (ref 135–145)
Total Bilirubin: 0.5 mg/dL (ref 0.2–1.2)
Total Protein: 6.6 g/dL (ref 6.0–8.3)

## 2023-06-30 LAB — HEMOGLOBIN A1C: Hgb A1c MFr Bld: 6 % (ref 4.6–6.5)

## 2023-06-30 MED ORDER — MELOXICAM 7.5 MG PO TABS: 7.5000 mg | ORAL_TABLET | Freq: Every day | ORAL | 1 refills | Status: DC | PRN

## 2023-06-30 MED ORDER — ERYTHROMYCIN 5 MG/GM OP OINT: 1.0000 | TOPICAL_OINTMENT | Freq: Every day | OPHTHALMIC | 3 refills | Status: AC

## 2023-06-30 MED ORDER — CYCLOBENZAPRINE HCL 10 MG PO TABS: 10.0000 mg | ORAL_TABLET | Freq: Two times a day (BID) | ORAL | 1 refills | Status: DC | PRN

## 2023-06-30 MED ORDER — OLOPATADINE HCL 0.1 % OP SOLN: 1.0000 [drp] | Freq: Every day | OPHTHALMIC | 3 refills | Status: AC | PRN

## 2023-06-30 NOTE — Assessment & Plan Note (Signed)
Occasional Diazepam prn  Potential benefits of a long term opioids use as well as potential risks (i.e. addiction risk, apnea etc) and complications (i.e. Somnolence, constipation and others) were explained to the patient and were aknowledged.

## 2023-06-30 NOTE — Assessment & Plan Note (Signed)
 Patanol eye gtt

## 2023-06-30 NOTE — Assessment & Plan Note (Signed)
Monitor CMET

## 2023-06-30 NOTE — Assessment & Plan Note (Addendum)
 Erythro eye oint at bedtime. Patanol gtt F/u w/ophthalmology

## 2023-06-30 NOTE — Assessment & Plan Note (Signed)
Doing well.  Treat allergies.

## 2023-06-30 NOTE — Progress Notes (Addendum)
 Subjective:  Patient ID: Karen [name], female    DOB: 12-27-1955  Age: 68 y.o. MRN: [id number]  CC: Medical Management of Chronic Issues (6 MNTH F/U)   HPI Karen [name] presents for low sodium, LBP, anxiety f/u C/o crusting in the eye corners x months  Outpatient Medications Prior to Visit  Medication Sig Dispense Refill   Calcium-Vitamin D-Vitamin K (VIACTIV PO) Take 1 tablet by mouth 2 (two) times daily. CHEW     diazepam (VALIUM) 2 MG tablet Take 1 tablet (2 mg total) by mouth every 8 (eight) hours as needed for anxiety. 60 tablet 1   diclofenac sodium (VOLTAREN) 1 % GEL Apply 4 g topically 4 (four) times daily. (Patient taking differently: Apply 4 g topically See admin instructions. Apply 4 grams to the right knee four times daily) 100 g 3   diphenhydrAMINE (BENADRYL) 50 MG tablet Take 1 tablet (50 mg total) by mouth every 6 (six) hours as needed for itching or allergies. 30 tablet 1   docusate sodium (COLACE) 100 MG capsule Take 1 capsule (100 mg total) by mouth 2 (two) times daily. (Patient taking differently: Take 100 mg by mouth See admin instructions. Take 100 mg by mouth one to two times daily) 10 capsule 0   EPINEPHrine (EPIPEN 2-PAK) 0.3 mg/0.3 mL IJ SOAJ injection Inject 0.3 mg into the muscle as needed for anaphylaxis. 1 each 3   estrogen-methylTESTOSTERone 0.625-1.25 MG per tablet Take 1 tablet by mouth daily. 90 tablet 3   fexofenadine (ALLEGRA) 180 MG tablet Take 1 tablet (180 mg total) by mouth daily. 100 tablet 3   hydrocortisone 2.5 % cream Apply 1 application topically 2 (two) times daily as needed (for rashes).      meclizine (ANTIVERT) 25 MG tablet Take 1 tablet (25 mg total) by mouth 3 (three) times daily as needed for dizziness. 30 tablet 0   Multiple Vitamin (MULTIVITAMIN WITH MINERALS) TABS tablet Take 1 tablet by mouth daily.     ondansetron (ZOFRAN) 4 MG tablet Take 1 tablet (4 mg total) by mouth every 8 (eight) hours as needed for nausea or vomiting. 20 tablet 0    polyethylene glycol (MIRALAX / GLYCOLAX) packet Take 17 g by mouth daily as needed for moderate constipation. (Patient taking differently: Take 17 g by mouth daily as needed for moderate constipation (MIX AND DRINK).) 14 each 0   pseudoephedrine (SUDAFED) 60 MG tablet Take 1 tablet (60 mg total) by mouth every 4 (four) hours as needed (allergic reaction). 60 tablet 1   triamcinolone ointment (KENALOG) 0.5 % Apply 1 Application topically 3 (three) times daily as needed. 90 g 2   vitamin C (ASCORBIC ACID) 500 MG tablet Take 500 mg by mouth daily.     cyclobenzaprine (FLEXERIL) 10 MG tablet Take 1 tablet (10 mg total) by mouth 2 (two) times daily as needed for muscle spasms. 60 tablet 1   meloxicam (MOBIC) 7.5 MG tablet Take 1 tablet (7.5 mg total) by mouth daily as needed for pain. 30 tablet 0   olopatadine (PATANOL) 0.1 % ophthalmic solution Place 1 drop into both eyes daily as needed for allergies.      No facility-administered medications prior to visit.    ROS: Review of Systems  Constitutional:  Negative for activity change, appetite change, chills, fatigue and unexpected weight change.  HENT:  Negative for congestion, mouth sores and sinus pressure.   Eyes:  Negative for visual disturbance.  Respiratory:  Negative for cough and chest tightness.  Gastrointestinal:  Negative for abdominal pain and nausea.  Genitourinary:  Negative for difficulty urinating, frequency and vaginal pain.  Musculoskeletal:  Negative for back pain and gait problem.  Skin:  Negative for pallor and rash.  Neurological:  Negative for dizziness, tremors, weakness, numbness and headaches.  Psychiatric/Behavioral:  Negative for confusion and sleep disturbance. The patient is nervous/anxious.     Objective:  BP 118/80   Pulse 64   Temp 98.2 F (36.8 C) (Oral)   Ht 5\' 4"  (1.626 m)   Wt 134 lb (60.8 kg)   SpO2 98%   BMI 23.00 kg/m   BP Readings from Last 3 Encounters:  06/30/23 118/80  12/30/22 128/78   09/15/22 120/84    Wt Readings from Last 3 Encounters:  06/30/23 134 lb (60.8 kg)  12/30/22 133 lb 6.4 oz (60.5 kg)  09/15/22 133 lb (60.3 kg)    Physical Exam Constitutional:      General: She is not in acute distress.    Appearance: Normal appearance. She is well-developed.  HENT:     Head: Normocephalic.     Right Ear: External ear normal.     Left Ear: External ear normal.     Nose: Nose normal.  Eyes:     General:        Right eye: No discharge.        Left eye: No discharge.     Conjunctiva/sclera: Conjunctivae normal.     Pupils: Pupils are equal, round, and reactive to light.  Neck:     Thyroid: No thyromegaly.     Vascular: No JVD.     Trachea: No tracheal deviation.  Cardiovascular:     Rate and Rhythm: Normal rate and regular rhythm.     Heart sounds: Normal heart sounds.  Pulmonary:     Effort: No respiratory distress.     Breath sounds: No stridor. No wheezing.  Abdominal:     General: Bowel sounds are normal. There is no distension.     Palpations: Abdomen is soft. There is no mass.     Tenderness: There is no abdominal tenderness. There is no guarding or rebound.  Musculoskeletal:        General: No tenderness.     Cervical back: Normal range of motion and neck supple. No rigidity.  Lymphadenopathy:     Cervical: No cervical adenopathy.  Skin:    Findings: No erythema or rash.  Neurological:     Cranial Nerves: No cranial nerve deficit.     Motor: No abnormal muscle tone.     Coordination: Coordination normal.     Deep Tendon Reflexes: Reflexes normal.  Psychiatric:        Behavior: Behavior normal.        Thought Content: Thought content normal.        Judgment: Judgment normal.   rosacea  Lab Results  Component Value Date   WBC 4.3 07/02/2022   HGB 13.7 07/02/2022   HCT 40.2 07/02/2022   PLT 221.0 07/02/2022   GLUCOSE 93 12/30/2022   CHOL 242 (H) 07/02/2022   TRIG 38.0 07/02/2022   HDL 100.80 07/02/2022   LDLDIRECT 134.0 01/25/2012    LDLCALC 134 (H) 07/02/2022   ALT 15 12/30/2022   AST 23 12/30/2022   NA 130 (L) 12/30/2022   K 4.1 12/30/2022   CL 99 12/30/2022   CREATININE 0.95 12/30/2022   BUN 19 12/30/2022   CO2 27 12/30/2022   TSH 2.34 07/02/2022   INR  1.0 03/28/2019   HGBA1C 6.2 12/30/2022    MM 3D SCREENING MAMMOGRAM BILATERAL BREAST Result Date: 01/06/2023 CLINICAL DATA:  Screening. EXAM: DIGITAL SCREENING BILATERAL MAMMOGRAM WITH TOMOSYNTHESIS AND CAD TECHNIQUE: Bilateral screening digital craniocaudal and mediolateral oblique mammograms were obtained. Bilateral screening digital breast tomosynthesis was performed. The images were evaluated with computer-aided detection. COMPARISON:  Previous exam(s). ACR Breast Density Category c: The breasts are heterogeneously dense, which may obscure small masses. FINDINGS: There are no findings suspicious for malignancy. IMPRESSION: No mammographic evidence of malignancy. A result letter of this screening mammogram will be mailed directly to the patient. RECOMMENDATION: Screening mammogram in one year. (Code:SM-B-01Y) BI-RADS CATEGORY  1: Negative. Electronically Signed   By: Sherian Rein M.D.   On: 01/06/2023 16:47    Assessment & Plan:   Problem List Items Addressed This Visit     Allergic conjunctivitis   Patanol eye gtt      Anxiety - Primary   Occasional Diazepam prn  Potential benefits of a long term opioids use as well as potential risks (i.e. addiction risk, apnea etc) and complications (i.e. Somnolence, constipation and others) were explained to the patient and were aknowledged.      Asthmatic bronchitis   Doing well.  Treat allergies.      Chronic renal insufficiency   Monitor CMET      Relevant Orders   Comprehensive metabolic panel   Hyperglycemia   Relevant Orders   Comprehensive metabolic panel   Hemoglobin A1c   Lumbar back pain   In PT      Relevant Medications   cyclobenzaprine (FLEXERIL) 10 MG tablet   meloxicam (MOBIC) 7.5 MG  tablet   Rosacea   Erythro eye oint at bedtime. Patanol gtt F/u w/ophthalmology         Meds ordered this encounter  Medications   cyclobenzaprine (FLEXERIL) 10 MG tablet    Sig: Take 1 tablet (10 mg total) by mouth 2 (two) times daily as needed for muscle spasms.    Dispense:  60 tablet    Refill:  1   meloxicam (MOBIC) 7.5 MG tablet    Sig: Take 1 tablet (7.5 mg total) by mouth daily as needed for pain.    Dispense:  30 tablet    Refill:  1   erythromycin ophthalmic ointment    Sig: Place 1 Application into both eyes at bedtime.    Dispense:  3.5 g    Refill:  3   olopatadine (PATANOL) 0.1 % ophthalmic solution    Sig: Place 1 drop into both eyes daily as needed for allergies.    Dispense:  5 mL    Refill:  3      Follow-up: Return in about 6 months (around 12/31/2023) for Wellness Exam.  Sonda Primes, MD

## 2023-06-30 NOTE — Assessment & Plan Note (Signed)
In PT 

## 2023-07-01 ENCOUNTER — Encounter: Payer: Self-pay | Admitting: Internal Medicine

## 2023-07-12 ENCOUNTER — Ambulatory Visit (INDEPENDENT_AMBULATORY_CARE_PROVIDER_SITE_OTHER): Payer: Medicare Other | Admitting: Podiatry

## 2023-07-12 ENCOUNTER — Encounter: Payer: Self-pay | Admitting: Podiatry

## 2023-07-12 DIAGNOSIS — M79675 Pain in left toe(s): Secondary | ICD-10-CM

## 2023-07-12 DIAGNOSIS — B351 Tinea unguium: Secondary | ICD-10-CM

## 2023-07-12 DIAGNOSIS — M79674 Pain in right toe(s): Secondary | ICD-10-CM | POA: Diagnosis not present

## 2023-07-12 NOTE — Progress Notes (Signed)
 Subjective: Chief Complaint  Patient presents with   RFC    RM#11 RFC no concerns at this time.    68 year old female with the above concerns.  States states the nails have become thick and they cause pain. No swelling, redness or signs of infection.   Objective: AAO x3, NAD DP/PT pulses palpable bilaterally, CRT less than 3 seconds Nails continue be hypertrophic, dystrophic with  brown discoloration.  Clinically, the nails appear to be about the same. There is some dried blood on the distal part of the left 4th and 5th digit toenail.  There is no edema, erythema or signs of infection of the toenail sites. No pain with calf compression, swelling, warmth, erythema  Assessment: 68 year old female with symptomatic onychomycosis, onychodystrophy  Plan: -All treatment options discussed with the patient including all alternatives, risks, complications.  -Sharply debrided x10 without any complications or bleeding.  At this time wants to proceed with routine debridement.  Monitoring signs or symptoms of infection.  Return in about 9 weeks (around 09/13/2023).  Vivi Barrack DPM

## 2023-09-10 IMAGING — MR MR LUMBAR SPINE W/O CM
4 of 6 series · 19 of 48 positions shown · non-contrast
Comparison: Lumbar spine CT from earlier the same day

CLINICAL DATA: Acute lumbar myelopathy. Bilateral lower leg
cramping.

EXAM:
MRI LUMBAR SPINE WITHOUT CONTRAST
TECHNIQUE: Multiplanar, multisequence MR imaging of the lumbar spine was
performed. No intravenous contrast was administered.

[Series 2: T2 · sagittal · 4.0mm · 0.47mm/px · 4 of 15 slices shown (1 of 2)]
[im 1/15]
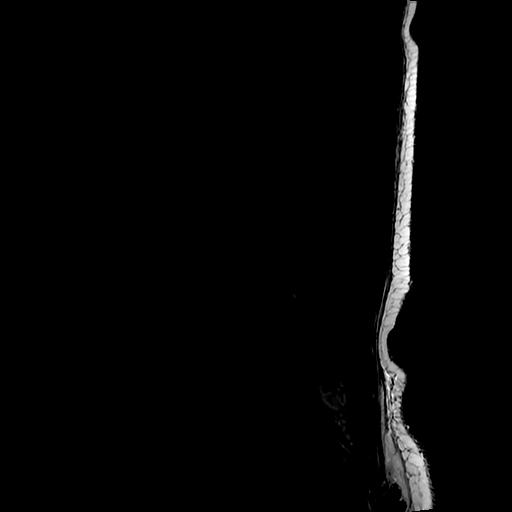
[im 5/15]
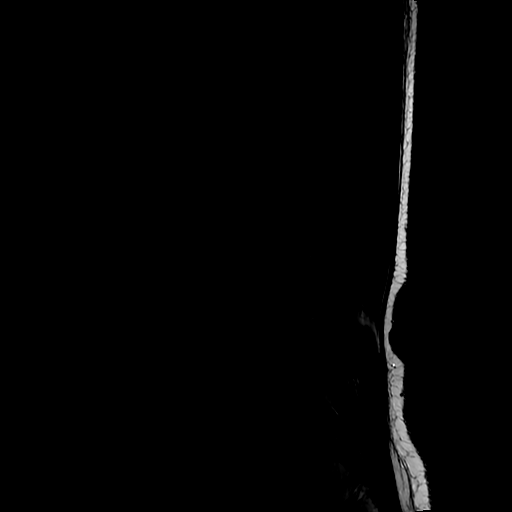
[im 10/15]
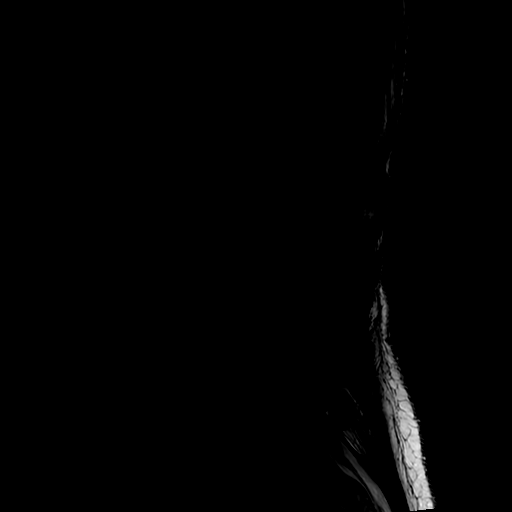
[im 15/15]
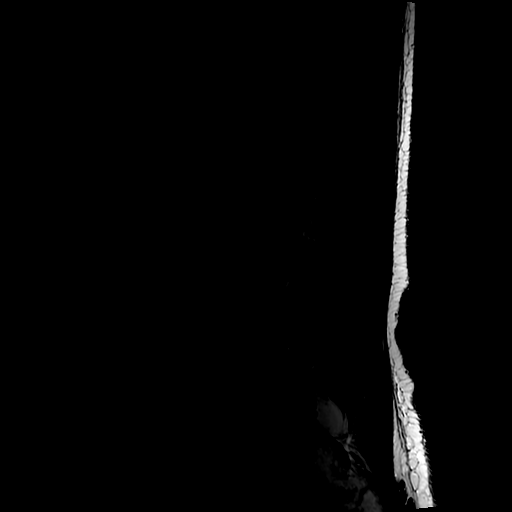

[Series 4: T1 · sagittal · 4.0mm · 0.47mm/px · 3 of 15 slices shown (1 of 2)]
[im 1/15]
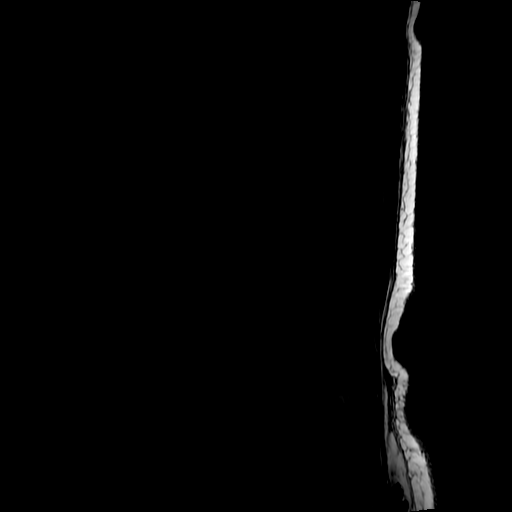
[im 10/15]
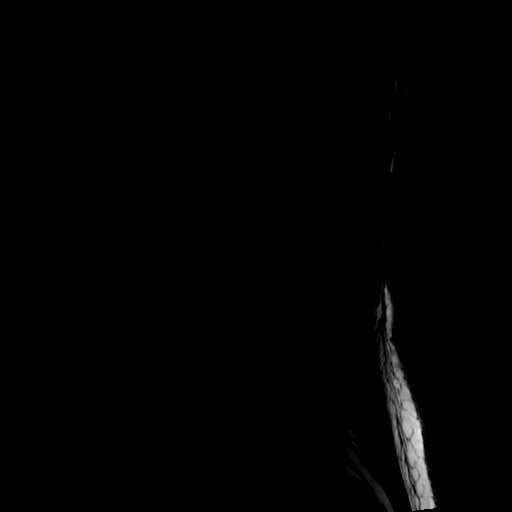
[im 15/15]
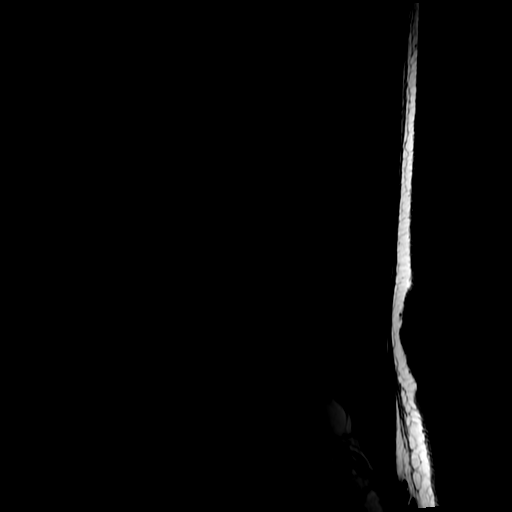

[Series 5: T2 · axial · 4.0mm · 0.39mm/px · z∈[-20,+185]mm · 9 of 42 slices shown (2 of 2)]
[im 1/42]
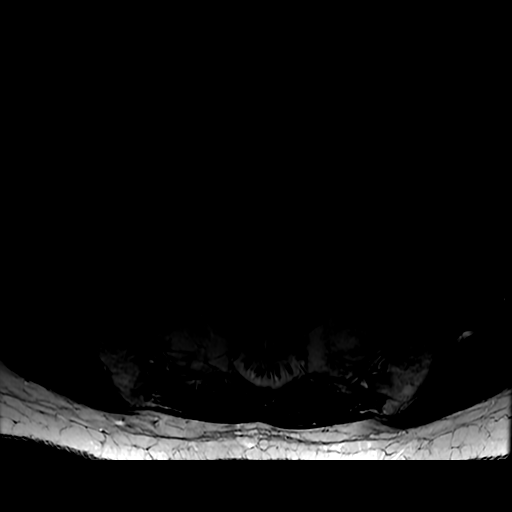
[im 8/42]
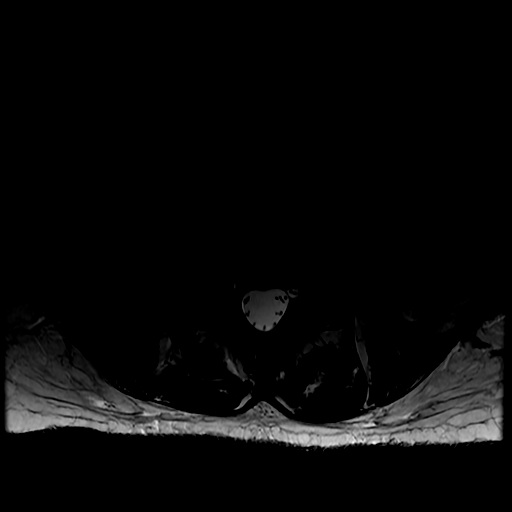
[im 12/42]
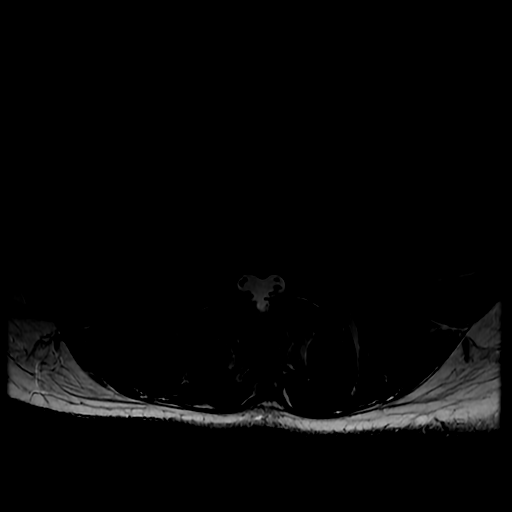
[im 19/42]
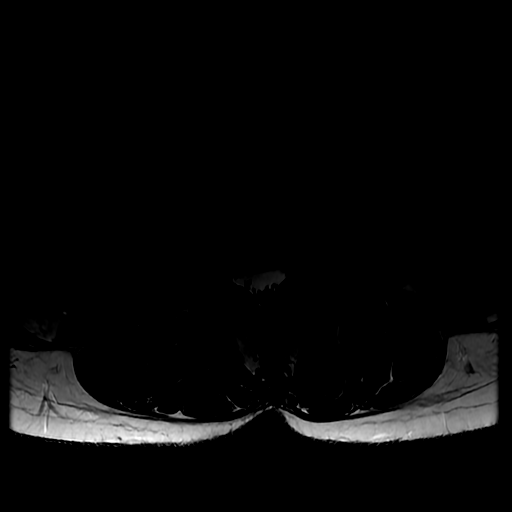
[im 23/42]
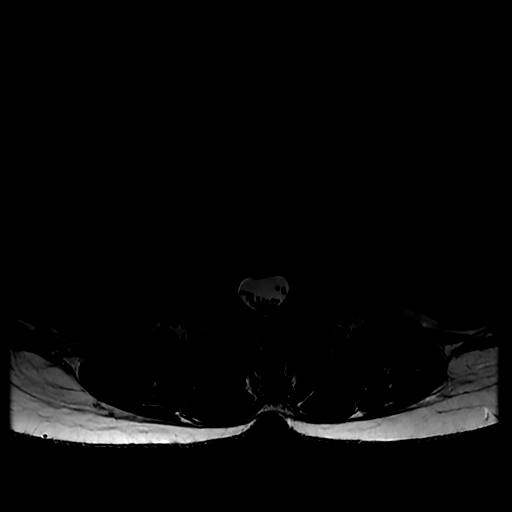
[im 30/42]
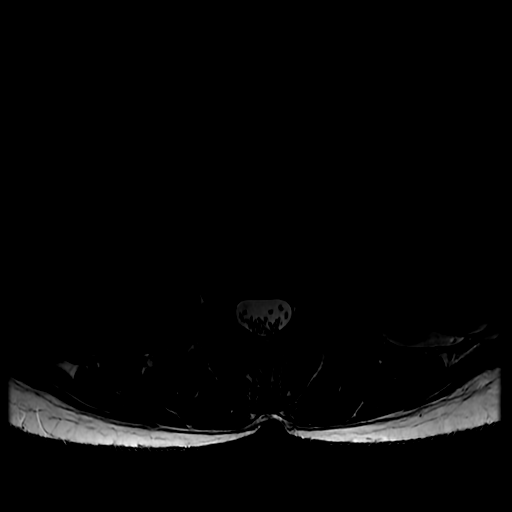
[im 34/42]
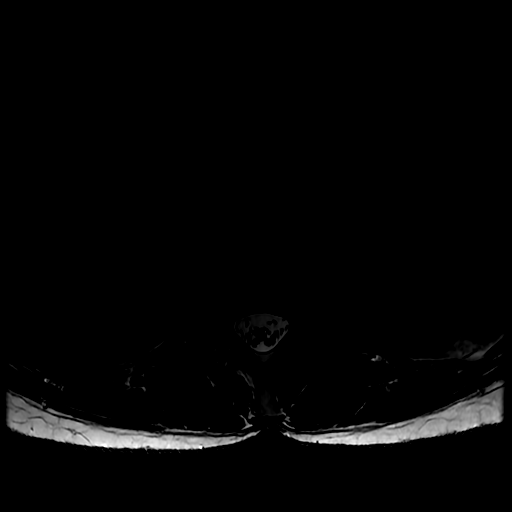
[im 38/42]
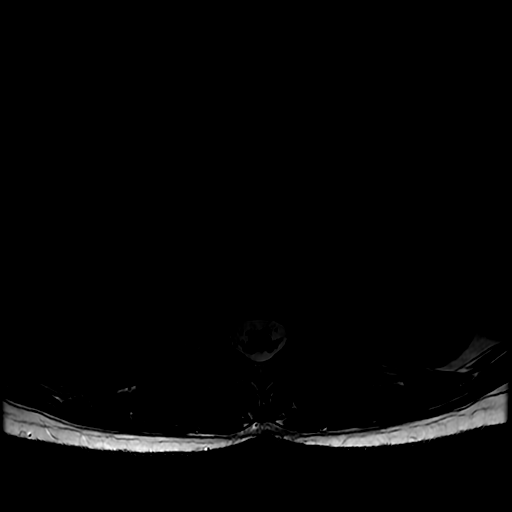
[im 42/42]
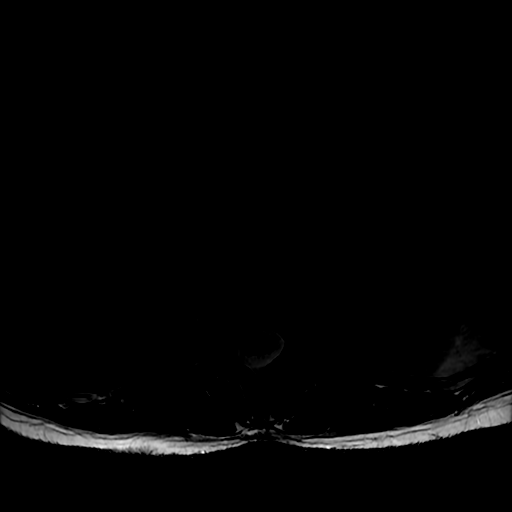

[Series 6: T1 · axial · 4.0mm · 0.39mm/px · z∈[+15,+165]mm · 3 of 42 slices shown (2 of 2)]
[im 8/42]
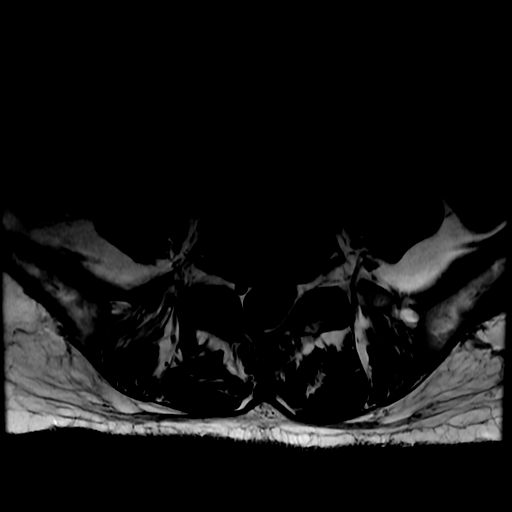
[im 23/42]
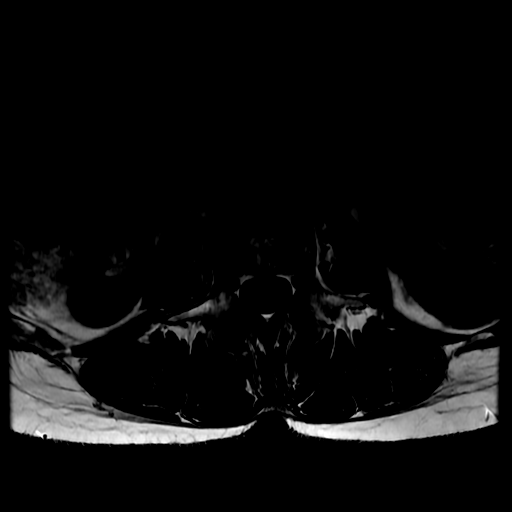
[im 38/42]
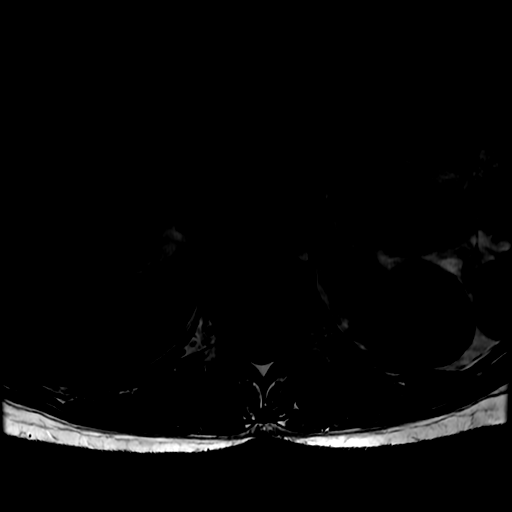

[19 of 48 positions shown; findings below may reference images not displayed]

FINDINGS: Segmentation:  5 lumbar type vertebrae

Alignment:  Anterolisthesis at L4-5

Vertebrae:  No fracture, evidence of discitis, or bone lesion.

Conus medullaris and cauda equina: Conus extends to the L1-2 level.
Conus and cauda equina appear normal.

Paraspinal and other soft tissues: Negative for perispinal mass or
inflammation

Disc levels:

L2-L3: Right foraminal disc bulging.  No neural compression

L3-L4: Disc narrowing and foraminal predominant bulging. No neural
compression

L4-L5: Degenerative facet spurring with anterolisthesis. The disc is
narrowed and bulging. Moderate thecal sac narrowing, either L5 nerve
root could be affected in the subarticular recesses. Right more than
left foraminal narrowing, noncompressive based on axial images

L5-S1:Mild facet spurring and disc bulging.
IMPRESSION: 1. No acute finding.
2. Lumbar spine degeneration most notable at L4-5 where there is
anterolisthesis and moderate spinal stenosis. Either L5 nerve root
could be affected in the subarticular recesses.

## 2023-09-20 ENCOUNTER — Ambulatory Visit (INDEPENDENT_AMBULATORY_CARE_PROVIDER_SITE_OTHER): Admitting: Podiatry

## 2023-09-20 DIAGNOSIS — M79675 Pain in left toe(s): Secondary | ICD-10-CM | POA: Diagnosis not present

## 2023-09-20 DIAGNOSIS — B351 Tinea unguium: Secondary | ICD-10-CM | POA: Diagnosis not present

## 2023-09-20 DIAGNOSIS — M79674 Pain in right toe(s): Secondary | ICD-10-CM | POA: Diagnosis not present

## 2023-09-20 NOTE — Progress Notes (Signed)
 Subjective: Chief Complaint  Patient presents with   RFC    Notices improvement in appearance in smaller toes but not the larger ones. Needs nails trimmed.      68 year old female with the above concerns.  States states the nails have become thick and they cause pain.  Occasionally she tries to trim them herself and get to uncomfortable.  No swelling, redness or signs of infection.   She is an avid Armed forces operational officer.  Objective: AAO x3, NAD DP/PT pulses palpable bilaterally, CRT less than 3 seconds Nails continue be hypertrophic, dystrophic with  brown discoloration.  Clinically, the nails appear to be about the same. There is no edema, erythema or signs of infection of the toenail sites. No pain with calf compression, swelling, warmth, erythema  Assessment: 68 year old female with symptomatic onychomycosis, onychodystrophy  Plan: -All treatment options discussed with the patient including all alternatives, risks, complications.  -Sharply debrided x10 without any complications or bleeding.  At this time wants to proceed with routine debridement. Offloading to help decrease pressure/microtrauma.  Return in about 3 months (around 12/21/2023).  Charity Conch DPM

## 2023-12-27 ENCOUNTER — Other Ambulatory Visit: Payer: Self-pay | Admitting: Obstetrics and Gynecology

## 2023-12-27 ENCOUNTER — Ambulatory Visit (INDEPENDENT_AMBULATORY_CARE_PROVIDER_SITE_OTHER): Admitting: Podiatry

## 2023-12-27 DIAGNOSIS — M79674 Pain in right toe(s): Secondary | ICD-10-CM

## 2023-12-27 DIAGNOSIS — Z1231 Encounter for screening mammogram for malignant neoplasm of breast: Secondary | ICD-10-CM

## 2023-12-27 DIAGNOSIS — M79675 Pain in left toe(s): Secondary | ICD-10-CM

## 2023-12-27 DIAGNOSIS — B351 Tinea unguium: Secondary | ICD-10-CM | POA: Diagnosis not present

## 2023-12-27 NOTE — Progress Notes (Signed)
 Subjective: Chief Complaint  Patient presents with   Nail Problem    Pt stated that she is here to have her nails trimmed    68 year old female with the above concerns.  States states the nails have become thick and they cause pain.  As the nails become elongated they become painful.  She states the big toenails split.  She is an avid Armed forces operational officer.  Objective: AAO x3, NAD DP/PT pulses palpable bilaterally, CRT less than 3 seconds Nails continue be hypertrophic, dystrophic with  brown discoloration.  Clinically, the nails appear to be about the same. There is no edema, erythema or signs of infection of the toenail sites. No pain with calf compression, swelling, warmth, erythema  Assessment: 68 year old female with symptomatic onychomycosis, onychodystrophy  Plan: -All treatment options discussed with the patient including all alternatives, risks, complications.  -Sharply debrided x10 without any complications or bleeding.  At this time wants to proceed with routine debridement. Offloading to help decrease pressure/microtrauma. -Discussed nail strengthener  Return in about 3 months (around 03/27/2024) for nail trim .   Donnice JONELLE Fees DPM

## 2023-12-29 ENCOUNTER — Ambulatory Visit

## 2023-12-29 VITALS — BP 118/70 | HR 68 | Ht 64.0 in | Wt 129.0 lb

## 2023-12-29 DIAGNOSIS — Z0000 Encounter for general adult medical examination without abnormal findings: Secondary | ICD-10-CM

## 2023-12-29 DIAGNOSIS — Z23 Encounter for immunization: Secondary | ICD-10-CM
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
## 2023-12-29 NOTE — Patient Instructions (Addendum)
 Karen [name],  Thank you for taking the time for your Medicare Wellness Visit. I appreciate your continued commitment to your health goals. Please review the care plan we discussed, and feel free to reach out if I can assist you further.  Medicare recommends these wellness visits once per year to help you and your care team stay ahead of potential health issues. These visits are designed to focus on prevention, allowing your provider to concentrate on managing your acute and chronic conditions during your regular appointments.  Please note that Annual Wellness Visits do not include a physical exam. Some assessments may be limited, especially if the visit was conducted virtually. If needed, we may recommend a separate in-person follow-up with your provider.  Ongoing Care Seeing your primary care provider every 3 to 6 months helps us  monitor your health and provide consistent, personalized care.   Referrals If a referral was made during today's visit and you haven't received any updates within two weeks, please contact the referred provider directly to check on the status.  Recommended Screenings:  Health Maintenance  Topic Date Due   Hepatitis C Screening  Never done   Pneumococcal Vaccine for age over 51 (3 of 3 - PCV20 or PCV21) 05/02/2017   Colon Cancer Screening  01/02/2021   DTaP/Tdap/Td vaccine (2 - Td or Tdap) 01/31/2023   Flu Shot  11/19/2023   COVID-19 Vaccine (4 - 2025-26 season) 12/20/2023   Medicare Annual Wellness Visit  12/28/2024   Mammogram  01/05/2025   DEXA scan (bone density measurement)  Completed   Zoster (Shingles) Vaccine  Completed   HPV Vaccine  Aged Out   Meningitis B Vaccine  Aged Out       12/29/2023    8:13 AM  Advanced Directives  Does Patient Have a Medical Advance Directive? Yes  Type of Estate agent of [place];Living will  Copy of Healthcare Power of Attorney in Chart? No - copy requested   Advance Care Planning is important  because it: Ensures you receive medical care that aligns with your values, goals, and preferences. Provides guidance to your family and loved ones, reducing the emotional burden of decision-making during critical moments.  Vision: Annual vision screenings are recommended for early detection of glaucoma, cataracts, and diabetic retinopathy. These exams can also reveal signs of chronic conditions such as diabetes and high blood pressure.  Dental: Annual dental screenings help detect early signs of oral cancer, gum disease, and other conditions linked to overall health, including heart disease and diabetes.

## 2023-12-31 ENCOUNTER — Telehealth: Payer: Self-pay

## 2023-12-31 NOTE — Telephone Encounter (Signed)
I am not currently accepting new patients.  °

## 2023-12-31 NOTE — Telephone Encounter (Signed)
 Copied from CRM #[id number]. Topic: General - Other >> Dec 31, 2023 10:10 AM Antwanette L wrote: Reason for CRM: Patient is calling to request an update on switching providers and would like to establish care with Dr. Geofm. The patient is requesting a callback from [place]. Please contact the patient at [phone]

## 2024-01-03 ENCOUNTER — Ambulatory Visit: Admitting: Internal Medicine

## 2024-01-03 ENCOUNTER — Encounter: Payer: Self-pay | Admitting: Internal Medicine

## 2024-01-03 ENCOUNTER — Other Ambulatory Visit (INDEPENDENT_AMBULATORY_CARE_PROVIDER_SITE_OTHER)

## 2024-01-03 VITALS — BP 102/62 | HR 67 | Ht 64.0 in | Wt 131.2 lb

## 2024-01-03 DIAGNOSIS — F419 Anxiety disorder, unspecified: Secondary | ICD-10-CM | POA: Diagnosis not present

## 2024-01-03 DIAGNOSIS — N1831 Chronic kidney disease, stage 3a: Secondary | ICD-10-CM

## 2024-01-03 DIAGNOSIS — Z0000 Encounter for general adult medical examination without abnormal findings: Secondary | ICD-10-CM | POA: Diagnosis not present

## 2024-01-03 DIAGNOSIS — Z23 Encounter for immunization: Secondary | ICD-10-CM

## 2024-01-03 DIAGNOSIS — R7989 Other specified abnormal findings of blood chemistry: Secondary | ICD-10-CM

## 2024-01-03 DIAGNOSIS — E785 Hyperlipidemia, unspecified: Secondary | ICD-10-CM | POA: Diagnosis not present

## 2024-01-03 LAB — URINALYSIS, ROUTINE W REFLEX MICROSCOPIC
Bilirubin Urine: NEGATIVE
Ketones, ur: NEGATIVE
Nitrite: NEGATIVE
Specific Gravity, Urine: 1.01 (ref 1.000–1.030)
Total Protein, Urine: NEGATIVE
Urine Glucose: NEGATIVE
Urobilinogen, UA: 0.2 (ref 0.0–1.0)
pH: 6.5 (ref 5.0–8.0)

## 2024-01-03 LAB — LIPID PANEL
Cholesterol: 235 mg/dL — ABNORMAL HIGH (ref 0–200)
HDL: 93.2 mg/dL (ref >39.00)
LDL Cholesterol: 136 mg/dL — ABNORMAL HIGH (ref 0–99)
NonHDL: 142.29
Total CHOL/HDL Ratio: 3
Triglycerides: 33 mg/dL (ref 0.0–149.0)
VLDL: 6.6 mg/dL (ref 0.0–40.0)

## 2024-01-03 LAB — CBC WITH DIFFERENTIAL/PLATELET
Basophils Absolute: 0.1 K/uL (ref 0.0–0.1)
Basophils Relative: 1.2 % (ref 0.0–3.0)
Eosinophils Absolute: 0.1 K/uL (ref 0.0–0.7)
Eosinophils Relative: 1.7 % (ref 0.0–5.0)
HCT: 39.4 % (ref 36.0–46.0)
Hemoglobin: 13.2 g/dL (ref 12.0–15.0)
Lymphocytes Relative: 35.6 % (ref 12.0–46.0)
Lymphs Abs: 1.6 K/uL (ref 0.7–4.0)
MCHC: 33.4 g/dL (ref 30.0–36.0)
MCV: 93.6 fl (ref 78.0–100.0)
Monocytes Absolute: 0.4 K/uL (ref 0.1–1.0)
Monocytes Relative: 8.3 % (ref 3.0–12.0)
Neutro Abs: 2.3 K/uL (ref 1.4–7.7)
Neutrophils Relative %: 53.2 % (ref 43.0–77.0)
Platelets: 210 K/uL (ref 150.0–400.0)
RBC: 4.22 Mil/uL (ref 3.87–5.11)
RDW: 13.2 % (ref 11.5–15.5)
WBC: 4.4 K/uL (ref 4.0–10.5)

## 2024-01-03 LAB — COMPREHENSIVE METABOLIC PANEL WITH GFR
ALT: 13 U/L (ref 0–35)
AST: 21 U/L (ref 0–37)
Albumin: 4.2 g/dL (ref 3.5–5.2)
Alkaline Phosphatase: 48 U/L (ref 39–117)
BUN: 18 mg/dL (ref 6–23)
CO2: 27 meq/L (ref 19–32)
Calcium: 9 mg/dL (ref 8.4–10.5)
Chloride: 100 meq/L (ref 96–112)
Creatinine, Ser: 0.87 mg/dL (ref 0.40–1.20)
GFR: 68.07 mL/min (ref >60.00)
Glucose, Bld: 100 mg/dL — ABNORMAL HIGH (ref 70–99)
Potassium: 4.1 meq/L (ref 3.5–5.1)
Sodium: 134 meq/L — ABNORMAL LOW (ref 135–145)
Total Bilirubin: 0.5 mg/dL (ref 0.2–1.2)
Total Protein: 6.7 g/dL (ref 6.0–8.3)

## 2024-01-03 LAB — TSH: TSH: 1.97 u[IU]/mL (ref 0.35–5.50)

## 2024-01-03 MED ORDER — MELOXICAM 7.5 MG PO TABS: 7.5000 mg | ORAL_TABLET | Freq: Every day | ORAL | 1 refills | Status: AC | PRN

## 2024-01-03 MED ORDER — EPINEPHRINE 0.3 MG/0.3ML IJ SOAJ: 0.3000 mg | INTRAMUSCULAR | 3 refills | Status: AC | PRN

## 2024-01-03 MED ORDER — DIAZEPAM 2 MG PO TABS: 2.0000 mg | ORAL_TABLET | Freq: Three times a day (TID) | ORAL | 1 refills | Status: AC | PRN

## 2024-01-03 MED ORDER — CYCLOBENZAPRINE HCL 10 MG PO TABS: 10.0000 mg | ORAL_TABLET | Freq: Two times a day (BID) | ORAL | 1 refills | Status: AC | PRN

## 2024-01-03 NOTE — Assessment & Plan Note (Addendum)
Monitor CMET Hydrate well

## 2024-01-03 NOTE — Assessment & Plan Note (Addendum)
 We discussed age appropriate health related issues, including available/recomended screening tests and vaccinations. We discussed a need for adhering to healthy diet and exercise. Labs were ordered to be later reviewed . All questions were answered. Not eating red meat A cardiac CT scan for calcium scoring offered 2/20.  2022 Coronary calcium score of 0. Pt declined colonoscopy, Cologuard OK to switch to Dr Geofm

## 2024-01-03 NOTE — Assessment & Plan Note (Signed)
Occasional Diazepam prn  Potential benefits of a long term opioids use as well as potential risks (i.e. addiction risk, apnea etc) and complications (i.e. Somnolence, constipation and others) were explained to the patient and were aknowledged.

## 2024-01-03 NOTE — Addendum Note (Signed)
 Addended by: BEVELY CURTISTINE PARAS on: 01/03/2024 08:43 AM   Modules accepted: Orders

## 2024-01-03 NOTE — Progress Notes (Signed)
 Subjective:  Patient ID: Karen [name], female    DOB: 05-Jan-1956  Age: 68 y.o. MRN: [id number]  CC: Annual Exam (Annual Exam)   HPI [name] presents for a well exam   Outpatient Medications Prior to Visit  Medication Sig Dispense Refill   Calcium-Vitamin D -Vitamin K (VIACTIV PO) Take 1 tablet by mouth 2 (two) times daily. CHEW     diclofenac  sodium (VOLTAREN ) 1 % GEL Apply 4 g topically 4 (four) times daily. (Patient taking differently: Apply 4 g topically See admin instructions. Apply 4 grams to the right knee four times daily) 100 g 3   diphenhydrAMINE  (BENADRYL ) 50 MG tablet Take 1 tablet (50 mg total) by mouth every 6 (six) hours as needed for itching or allergies. 30 tablet 1   docusate sodium  (COLACE) 100 MG capsule Take 1 capsule (100 mg total) by mouth 2 (two) times daily. (Patient taking differently: Take 100 mg by mouth See admin instructions. Take 100 mg by mouth one to two times daily) 10 capsule 0   erythromycin  ophthalmic ointment Place 1 Application into both eyes at bedtime. 3.5 g 3   estrogen-methylTESTOSTERone 0.625-1.25 MG per tablet Take 1 tablet by mouth daily. 90 tablet 3   fexofenadine  (ALLEGRA ) 180 MG tablet Take 1 tablet (180 mg total) by mouth daily. 100 tablet 3   hydrocortisone 2.5 % cream Apply 1 application topically 2 (two) times daily as needed (for rashes).      meclizine  (ANTIVERT ) 25 MG tablet Take 1 tablet (25 mg total) by mouth 3 (three) times daily as needed for dizziness. 30 tablet 0   Multiple Vitamin (MULTIVITAMIN WITH MINERALS) TABS tablet Take 1 tablet by mouth daily.     olopatadine  (PATANOL) 0.1 % ophthalmic solution Place 1 drop into both eyes daily as needed for allergies. 5 mL 3   ondansetron  (ZOFRAN ) 4 MG tablet Take 1 tablet (4 mg total) by mouth every 8 (eight) hours as needed for nausea or vomiting. 20 tablet 0   polyethylene glycol (MIRALAX  / GLYCOLAX ) packet Take 17 g by mouth daily as needed for moderate constipation. (Patient taking  differently: Take 17 g by mouth daily as needed for moderate constipation (MIX AND DRINK).) 14 each 0   pseudoephedrine  (SUDAFED) 60 MG tablet Take 1 tablet (60 mg total) by mouth every 4 (four) hours as needed (allergic reaction). 60 tablet 1   vitamin C (ASCORBIC ACID) 500 MG tablet Take 500 mg by mouth daily.     cyclobenzaprine  (FLEXERIL ) 10 MG tablet Take 1 tablet (10 mg total) by mouth 2 (two) times daily as needed for muscle spasms. 60 tablet 1   diazepam  (VALIUM ) 2 MG tablet Take 1 tablet (2 mg total) by mouth every 8 (eight) hours as needed for anxiety. 60 tablet 1   EPINEPHrine  (EPIPEN  2-PAK) 0.3 mg/0.3 mL IJ SOAJ injection Inject 0.3 mg into the muscle as needed for anaphylaxis. 1 each 3   meloxicam  (MOBIC ) 7.5 MG tablet Take 1 tablet (7.5 mg total) by mouth daily as needed for pain. 30 tablet 1   No facility-administered medications prior to visit.    ROS: Review of Systems  Constitutional:  Negative for activity change, appetite change, chills, fatigue and unexpected weight change.  HENT:  Negative for congestion, mouth sores and sinus pressure.   Eyes:  Negative for visual disturbance.  Respiratory:  Negative for cough and chest tightness.   Gastrointestinal:  Negative for abdominal pain and nausea.  Genitourinary:  Negative for difficulty urinating,  frequency and vaginal pain.  Musculoskeletal:  Negative for back pain and gait problem.  Skin:  Negative for pallor and rash.  Neurological:  Negative for dizziness, tremors, weakness, numbness and headaches.  Psychiatric/Behavioral:  Negative for confusion and sleep disturbance.     Objective:  BP 102/62   Pulse 67   Ht 5' 4 (1.626 m)   Wt 131 lb 3.2 oz (59.5 kg)   SpO2 96%   BMI 22.52 kg/m   BP Readings from Last 3 Encounters:  01/03/24 102/62  12/29/23 118/70  06/30/23 118/80    Wt Readings from Last 3 Encounters:  01/03/24 131 lb 3.2 oz (59.5 kg)  12/29/23 129 lb (58.5 kg)  06/30/23 134 lb (60.8 kg)     Physical Exam Constitutional:      General: She is not in acute distress.    Appearance: She is well-developed.  HENT:     Head: Normocephalic.     Right Ear: External ear normal.     Left Ear: External ear normal.     Nose: Nose normal.  Eyes:     General:        Right eye: No discharge.        Left eye: No discharge.     Conjunctiva/sclera: Conjunctivae normal.     Pupils: Pupils are equal, round, and reactive to light.  Neck:     Thyroid : No thyromegaly.     Vascular: No JVD.     Trachea: No tracheal deviation.  Cardiovascular:     Rate and Rhythm: Normal rate and regular rhythm.     Heart sounds: Normal heart sounds.  Pulmonary:     Effort: No respiratory distress.     Breath sounds: No stridor. No wheezing.  Abdominal:     General: Bowel sounds are normal. There is no distension.     Palpations: Abdomen is soft. There is no mass.     Tenderness: There is no abdominal tenderness. There is no guarding or rebound.  Musculoskeletal:        General: No tenderness.     Cervical back: Normal range of motion and neck supple. No rigidity.  Lymphadenopathy:     Cervical: No cervical adenopathy.  Skin:    Findings: No erythema or rash.  Neurological:     Cranial Nerves: No cranial nerve deficit.     Motor: No abnormal muscle tone.     Coordination: Coordination normal.     Deep Tendon Reflexes: Reflexes normal.  Psychiatric:        Behavior: Behavior normal.        Thought Content: Thought content normal.        Judgment: Judgment normal.     Lab Results  Component Value Date   WBC 4.3 07/02/2022   HGB 13.7 07/02/2022   HCT 40.2 07/02/2022   PLT 221.0 07/02/2022   GLUCOSE 96 06/30/2023   CHOL 242 (H) 07/02/2022   TRIG 38.0 07/02/2022   HDL 100.80 07/02/2022   LDLDIRECT 134.0 01/25/2012   LDLCALC 134 (H) 07/02/2022   ALT 17 06/30/2023   AST 23 06/30/2023   NA 130 (L) 06/30/2023   K 3.7 06/30/2023   CL 98 06/30/2023   CREATININE 0.89 06/30/2023   BUN 18  06/30/2023   CO2 27 06/30/2023   TSH 2.34 07/02/2022   INR 1.0 03/28/2019   HGBA1C 6.0 06/30/2023    MM 3D SCREENING MAMMOGRAM BILATERAL BREAST Result Date: 01/06/2023 CLINICAL DATA:  Screening. EXAM: DIGITAL SCREENING BILATERAL  MAMMOGRAM WITH TOMOSYNTHESIS AND CAD TECHNIQUE: Bilateral screening digital craniocaudal and mediolateral oblique mammograms were obtained. Bilateral screening digital breast tomosynthesis was performed. The images were evaluated with computer-aided detection. COMPARISON:  Previous exam(s). ACR Breast Density Category c: The breasts are heterogeneously dense, which may obscure small masses. FINDINGS: There are no findings suspicious for malignancy. IMPRESSION: No mammographic evidence of malignancy. A result letter of this screening mammogram will be mailed directly to the patient. RECOMMENDATION: Screening mammogram in one year. (Code:SM-B-01Y) BI-RADS CATEGORY  1: Negative. Electronically Signed   By: Craig Farr M.D.   On: 01/06/2023 16:47    Assessment & Plan:   Problem List Items Addressed This Visit     Anxiety - Primary   Occasional Diazepam  prn  Potential benefits of a long term opioids use as well as potential risks (i.e. addiction risk, apnea etc) and complications (i.e. Somnolence, constipation and others) were explained to the patient and were aknowledged.      Relevant Medications   diazepam  (VALIUM ) 2 MG tablet   Chronic renal insufficiency   Monitor CMET Hydrate well      Elevated serum creatinine   Well adult exam   We discussed age appropriate health related issues, including available/recomended screening tests and vaccinations. We discussed a need for adhering to healthy diet and exercise. Labs were ordered to be later reviewed . All questions were answered. Not eating red meat A cardiac CT scan for calcium scoring offered 2/20.  2022 Coronary calcium score of 0. Pt declined colonoscopy, Cologuard         Meds ordered this encounter   Medications   cyclobenzaprine  (FLEXERIL ) 10 MG tablet    Sig: Take 1 tablet (10 mg total) by mouth 2 (two) times daily as needed for muscle spasms.    Dispense:  60 tablet    Refill:  1   diazepam  (VALIUM ) 2 MG tablet    Sig: Take 1 tablet (2 mg total) by mouth every 8 (eight) hours as needed for anxiety.    Dispense:  60 tablet    Refill:  1   EPINEPHrine  (EPIPEN  2-PAK) 0.3 mg/0.3 mL IJ SOAJ injection    Sig: Inject 0.3 mg into the muscle as needed for anaphylaxis.    Dispense:  1 each    Refill:  3   meloxicam  (MOBIC ) 7.5 MG tablet    Sig: Take 1 tablet (7.5 mg total) by mouth daily as needed for pain.    Dispense:  30 tablet    Refill:  1      Follow-up: No follow-ups on file.  Marolyn Noel, MD

## 2024-01-04 ENCOUNTER — Ambulatory Visit: Payer: Self-pay | Admitting: Internal Medicine

## 2024-01-17 ENCOUNTER — Ambulatory Visit
Admission: RE | Admit: 2024-01-17 | Discharge: 2024-01-17 | Disposition: A | Discharge status: Home / Self Care | Source: Ambulatory Visit | Attending: Obstetrics and Gynecology | Admitting: Obstetrics and Gynecology

## 2024-01-17 DIAGNOSIS — Z1231 Encounter for screening mammogram for malignant neoplasm of breast: Secondary | ICD-10-CM

## 2024-03-03 ENCOUNTER — Encounter: Payer: Self-pay | Admitting: Podiatry

## 2024-03-03 ENCOUNTER — Ambulatory Visit: Admitting: Podiatry

## 2024-03-03 DIAGNOSIS — M79674 Pain in right toe(s): Secondary | ICD-10-CM | POA: Diagnosis not present

## 2024-03-03 DIAGNOSIS — B351 Tinea unguium: Secondary | ICD-10-CM

## 2024-03-03 DIAGNOSIS — M79675 Pain in left toe(s): Secondary | ICD-10-CM | POA: Diagnosis not present

## 2024-03-03 NOTE — Progress Notes (Signed)
 Subjective: Chief Complaint  Patient presents with   RFC     RFC Non diabetic toenail trim and callus care.     68 year old female with the above concerns.  States states the nails have become thick and they cause pain.  She states it becomes painful with him thick and elongated.  She is an avid armed forces operational officer.  Objective: AAO x3, NAD DP/PT pulses palpable bilaterally, CRT less than 3 seconds Nails continue be hypertrophic, dystrophic with  brown discoloration.  Clinically, the nails appear to be about the same. There is no edema, erythema or signs of infection of the toenail sites. No pain with calf compression, swelling, warmth, erythema  Assessment: 68 year old female with symptomatic onychomycosis, onychodystrophy  Plan: -All treatment options discussed with the patient including all alternatives, risks, complications.  -Sharply debrided x10 without any complications or bleeding.  At this time wants to proceed with routine debridement. -Discussed nail strengthener  Return in about 3 months (around 06/03/2024).  Donnice JONELLE Fees DPM

## 2024-05-31 ENCOUNTER — Encounter: Admitting: Family Medicine

## 2024-06-02 ENCOUNTER — Ambulatory Visit: Admitting: Podiatry

## 2024-07-03 ENCOUNTER — Ambulatory Visit: Admitting: Internal Medicine

## 2024-07-05 ENCOUNTER — Encounter: Admitting: Family Medicine

## 2025-01-04 ENCOUNTER — Ambulatory Visit
